# Patient Record
Sex: Female | Born: 1965 | Race: White | Hispanic: No | Marital: Single | Smoking: Never smoker
Health system: Southern US, Community
[De-identification: ages and names within clinical notes are randomized; demographics above are authoritative.]

## PROBLEM LIST (undated history)

## (undated) DIAGNOSIS — K2970 Gastritis, unspecified, without bleeding: Secondary | ICD-10-CM

## (undated) DIAGNOSIS — G4730 Sleep apnea, unspecified: Secondary | ICD-10-CM

## (undated) DIAGNOSIS — Z87898 Personal history of other specified conditions: Secondary | ICD-10-CM

## (undated) DIAGNOSIS — K589 Irritable bowel syndrome without diarrhea: Secondary | ICD-10-CM

## (undated) DIAGNOSIS — K219 Gastro-esophageal reflux disease without esophagitis: Secondary | ICD-10-CM

## (undated) DIAGNOSIS — E119 Type 2 diabetes mellitus without complications: Secondary | ICD-10-CM

## (undated) DIAGNOSIS — D649 Anemia, unspecified: Secondary | ICD-10-CM

## (undated) DIAGNOSIS — I10 Essential (primary) hypertension: Secondary | ICD-10-CM

## (undated) DIAGNOSIS — E7800 Pure hypercholesterolemia, unspecified: Secondary | ICD-10-CM

## (undated) HISTORY — DX: Gastritis, unspecified, without bleeding: K29.70

## (undated) HISTORY — DX: Irritable bowel syndrome, unspecified: K58.9

## (undated) HISTORY — DX: Anemia, unspecified: D64.9

## (undated) HISTORY — DX: Personal history of other specified conditions: Z87.898

## (undated) HISTORY — DX: Type 2 diabetes mellitus without complications: E11.9

## (undated) HISTORY — DX: Pure hypercholesterolemia, unspecified: E78.00

## (undated) HISTORY — DX: Gastro-esophageal reflux disease without esophagitis: K21.9

## (undated) HISTORY — DX: Essential (primary) hypertension: I10

## (undated) HISTORY — PX: HERNIA REPAIR: SHX51

## (undated) HISTORY — DX: Sleep apnea, unspecified: G47.30

## (undated) HISTORY — PX: EYE SURGERY: SHX253

## (undated) HISTORY — PX: APPENDECTOMY: SHX54

---
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
## 2005-05-24 ENCOUNTER — Ambulatory Visit: Payer: Self-pay | Admitting: Internal Medicine

## 2006-07-04 ENCOUNTER — Ambulatory Visit: Payer: Self-pay | Admitting: Internal Medicine

## 2006-10-19 ENCOUNTER — Ambulatory Visit: Payer: Self-pay | Admitting: Gastroenterology

## 2006-10-23 ENCOUNTER — Ambulatory Visit: Payer: Self-pay | Admitting: Gastroenterology

## 2006-10-23 LAB — HM COLONOSCOPY: HM Colonoscopy: NORMAL

## 2007-09-06 ENCOUNTER — Ambulatory Visit: Payer: Self-pay | Admitting: Internal Medicine

## 2007-09-12 ENCOUNTER — Ambulatory Visit: Payer: Self-pay | Admitting: Internal Medicine

## 2008-03-18 ENCOUNTER — Ambulatory Visit: Payer: Self-pay | Admitting: Internal Medicine

## 2008-07-14 ENCOUNTER — Ambulatory Visit: Payer: Self-pay | Admitting: Internal Medicine

## 2008-09-09 ENCOUNTER — Ambulatory Visit: Payer: Self-pay | Admitting: Internal Medicine

## 2009-03-07 ENCOUNTER — Inpatient Hospital Stay: Payer: Self-pay | Admitting: Surgery

## 2009-06-30 ENCOUNTER — Ambulatory Visit: Payer: Self-pay | Admitting: Gastroenterology

## 2010-06-16 ENCOUNTER — Ambulatory Visit: Payer: Self-pay | Admitting: Internal Medicine

## 2010-06-28 ENCOUNTER — Ambulatory Visit: Payer: Self-pay | Admitting: Internal Medicine

## 2010-07-29 ENCOUNTER — Ambulatory Visit: Payer: Self-pay | Admitting: Anesthesiology

## 2010-08-05 ENCOUNTER — Ambulatory Visit: Payer: Self-pay | Admitting: Surgery

## 2011-08-16 ENCOUNTER — Ambulatory Visit: Payer: Self-pay | Admitting: Internal Medicine

## 2012-05-14 ENCOUNTER — Other Ambulatory Visit: Payer: Self-pay | Admitting: *Deleted

## 2012-05-14 ENCOUNTER — Telehealth: Payer: Self-pay | Admitting: *Deleted

## 2012-05-14 MED ORDER — LOSARTAN POTASSIUM 25 MG PO TABS: 25.0000 mg | ORAL_TABLET | Freq: Every day | ORAL | Status: DC

## 2012-05-14 NOTE — Telephone Encounter (Signed)
Dr. Lorin Picket:  Is it ok to fill a new script for patient for 90 day supply of Losartan Potassium 25 mg Appointment 06/2012

## 2012-05-14 NOTE — Telephone Encounter (Signed)
Ok to fill losartan 25mg  q day #90 with no refills - to get her through to her appt

## 2012-05-14 NOTE — Telephone Encounter (Signed)
Filled script for losartan 25 mg

## 2012-05-17 ENCOUNTER — Other Ambulatory Visit: Payer: Self-pay | Admitting: *Deleted

## 2012-05-17 NOTE — Telephone Encounter (Signed)
Script filled.

## 2012-05-21 ENCOUNTER — Other Ambulatory Visit: Payer: Self-pay | Admitting: *Deleted

## 2012-05-21 ENCOUNTER — Ambulatory Visit (INDEPENDENT_AMBULATORY_CARE_PROVIDER_SITE_OTHER): Payer: BC Managed Care – PPO | Admitting: *Deleted

## 2012-05-21 DIAGNOSIS — Z309 Encounter for contraceptive management, unspecified: Secondary | ICD-10-CM

## 2012-05-21 DIAGNOSIS — IMO0001 Reserved for inherently not codable concepts without codable children: Secondary | ICD-10-CM

## 2012-05-22 ENCOUNTER — Other Ambulatory Visit: Payer: Self-pay | Admitting: *Deleted

## 2012-05-22 MED ORDER — MEDROXYPROGESTERONE ACETATE 150 MG/ML IM SUSP: 150.0000 mg | INTRAMUSCULAR | Status: DC

## 2012-05-22 MED ORDER — MEDROXYPROGESTERONE ACETATE 150 MG/ML IM SUSP
150.0000 mg | Freq: Once | INTRAMUSCULAR | Status: AC
  Administered 2012-05-22: 150 mg via INTRAMUSCULAR

## 2012-06-05 ENCOUNTER — Telehealth: Payer: Self-pay | Admitting: *Deleted

## 2012-06-05 NOTE — Telephone Encounter (Signed)
Pt is coming in for labs Tuesday (Jan. 2) what labs and dx would you like? Thank you

## 2012-06-06 ENCOUNTER — Other Ambulatory Visit: Payer: Self-pay | Admitting: Internal Medicine

## 2012-06-06 DIAGNOSIS — R739 Hyperglycemia, unspecified: Secondary | ICD-10-CM

## 2012-06-06 DIAGNOSIS — I10 Essential (primary) hypertension: Secondary | ICD-10-CM

## 2012-06-06 DIAGNOSIS — D649 Anemia, unspecified: Secondary | ICD-10-CM

## 2012-06-06 DIAGNOSIS — E7800 Pure hypercholesterolemia, unspecified: Secondary | ICD-10-CM

## 2012-06-06 NOTE — Progress Notes (Signed)
Orders placed for labs

## 2012-06-06 NOTE — Telephone Encounter (Signed)
Placed orders for labs.  Let me know if problems.

## 2012-06-07 ENCOUNTER — Other Ambulatory Visit (INDEPENDENT_AMBULATORY_CARE_PROVIDER_SITE_OTHER): Payer: BC Managed Care – PPO

## 2012-06-07 DIAGNOSIS — E7800 Pure hypercholesterolemia, unspecified: Secondary | ICD-10-CM

## 2012-06-07 DIAGNOSIS — R739 Hyperglycemia, unspecified: Secondary | ICD-10-CM

## 2012-06-07 DIAGNOSIS — D649 Anemia, unspecified: Secondary | ICD-10-CM

## 2012-06-07 DIAGNOSIS — R7309 Other abnormal glucose: Secondary | ICD-10-CM

## 2012-06-07 DIAGNOSIS — I10 Essential (primary) hypertension: Secondary | ICD-10-CM

## 2012-06-07 LAB — BASIC METABOLIC PANEL
BUN: 16 mg/dL (ref 6–23)
GFR: 65.4 mL/min (ref >60.00)
Potassium: 4.2 mEq/L (ref 3.5–5.1)
Sodium: 138 mEq/L (ref 135–145)

## 2012-06-07 LAB — LIPID PANEL
Cholesterol: 191 mg/dL (ref 0–200)
Total CHOL/HDL Ratio: 5
Triglycerides: 201 mg/dL — ABNORMAL HIGH (ref 0.0–149.0)
VLDL: 40.2 mg/dL — ABNORMAL HIGH (ref 0.0–40.0)

## 2012-06-07 LAB — HEPATIC FUNCTION PANEL
Albumin: 3.8 g/dL (ref 3.5–5.2)
Total Protein: 7 g/dL (ref 6.0–8.3)

## 2012-06-07 LAB — CBC WITH DIFFERENTIAL/PLATELET
Eosinophils Relative: 0.8 % (ref 0.0–5.0)
HCT: 41.6 % (ref 36.0–46.0)
Lymphs Abs: 1.5 10*3/uL (ref 0.7–4.0)
Monocytes Relative: 7.2 % (ref 3.0–12.0)
Neutrophils Relative %: 58.5 % (ref 43.0–77.0)
Platelets: 189 10*3/uL (ref 150.0–400.0)
WBC: 4.5 10*3/uL (ref 4.5–10.5)

## 2012-06-07 LAB — HEMOGLOBIN A1C: Hgb A1c MFr Bld: 6 % (ref 4.6–6.5)

## 2012-06-07 LAB — LDL CHOLESTEROL, DIRECT: Direct LDL: 131 mg/dL

## 2012-06-12 ENCOUNTER — Ambulatory Visit (INDEPENDENT_AMBULATORY_CARE_PROVIDER_SITE_OTHER): Payer: BC Managed Care – PPO | Admitting: Internal Medicine

## 2012-06-12 ENCOUNTER — Encounter: Payer: Self-pay | Admitting: Internal Medicine

## 2012-06-12 VITALS — BP 140/100 | HR 80 | Temp 98.6°F | Ht 63.0 in | Wt 176.5 lb

## 2012-06-12 DIAGNOSIS — E119 Type 2 diabetes mellitus without complications: Secondary | ICD-10-CM

## 2012-06-12 DIAGNOSIS — E7800 Pure hypercholesterolemia, unspecified: Secondary | ICD-10-CM

## 2012-06-12 DIAGNOSIS — I10 Essential (primary) hypertension: Secondary | ICD-10-CM

## 2012-06-12 DIAGNOSIS — D649 Anemia, unspecified: Secondary | ICD-10-CM

## 2012-06-12 LAB — HM DIABETES FOOT EXAM

## 2012-06-12 MED ORDER — MEDROXYPROGESTERONE ACETATE 150 MG/ML IM SUSP: 150.0000 mg | INTRAMUSCULAR | Status: DC

## 2012-06-25 ENCOUNTER — Encounter: Payer: Self-pay | Admitting: Internal Medicine

## 2012-06-25 NOTE — Assessment & Plan Note (Signed)
A1c just checked - 6.0.  Low carb diet.  Exercise.  Will follow.

## 2012-06-25 NOTE — Assessment & Plan Note (Signed)
Hgb just checked and wnl.  Ferritin wnl (low normal).  Can try taking the iron qod.  Follow hgb and ferritin.  Has had GI w/up including EGD and colonoscopy 5/08.

## 2012-06-25 NOTE — Assessment & Plan Note (Signed)
Blood pressure elevated.  Have her spot check her pressure.  Send in readings over the next few weeks.  Follow metabolic panel.  Cough resolved with stopping Lisinopril.  On Losartan.

## 2012-06-25 NOTE — Assessment & Plan Note (Signed)
Triglycerides improved.  (201).  Low cholesterol diet and exercise.  (LDL up some from previous - 131).  Follow.   Check lipid panel with next labs.

## 2012-06-25 NOTE — Progress Notes (Signed)
  Subjective:    Patient ID: Anne [name], female    DOB: 02-Jan-1966, 47 y.o.   MRN: [id number]  HPI 47 year old female with past history of IBS, gastritis, hypertension and diabetes who comes in today for a scheduled follow up.  She states she is doing relatively well.  Still under increased stress with her husband and the legal issues.  She feels she is handling things relatively well.  She has been exercising.  She is taking Dexilant now.  Off Nexium.  No significant problems swallowing.  Trying to watch what she eats.  Bowels stable.  On Prozac.    Past Medical History  Diagnosis Date  . Hypertension   . Diabetes mellitus   . Hypercholesterolemia   . Anemia   . History of abnormal Pap smear     s/p LEEP, h/o HPV  . IBS (irritable bowel syndrome)   . Gastritis     Current Outpatient Prescriptions on File Prior to Visit  Medication Sig Dispense Refill  . dexlansoprazole (DEXILANT) 60 MG capsule Take 60 mg by mouth daily.      Marland Kitchen FLUoxetine (PROZAC) 20 MG capsule Take 20 mg by mouth daily.      . fluticasone (FLONASE) 50 MCG/ACT nasal spray Place 2 sprays into the nose daily.      Marland Kitchen losartan (COZAAR) 25 MG tablet Take 1 tablet (25 mg total) by mouth daily.  90 tablet  3  . medroxyPROGESTERone (DEPO-PROVERA) 150 MG/ML injection Inject 1 mL (150 mg total) into the muscle every 3 (three) months.  1 mL  3    Review of Systems Patient denies any headache, lightheadedness or dizziness. No significant sinus or allergy problems.  No chest pain, tightness or palpitations.  No increased shortness of breath, cough or congestion.  No nausea or vomiting.  No significant acid reflux.   No abdominal pain or cramping.  No bowel change, such as diarrhea, constipation, BRBPR or melana.  No urine change.   Feels she is handling the stress relatively well.  Exercising.  Watching what she eats.      Objective:   Physical Exam Filed Vitals:   06/12/12 1538  BP: 140/100  Pulse: 80  Temp: 98.6 F (37 C)    Blood pressure recheck:  54/24  47 year old female in no acute distress.   HEENT:  Nares - clear.  OP- without lesions or erythema.  NECK:  Supple, nontender.  No audible bruit.   HEART:  Appears to be regular. LUNGS:  Without crackles or wheezing audible.  Respirations even and unlabored.   RADIAL PULSE:  Equal bilaterally.  ABDOMEN:  Soft, nontender.  No audible abdominal bruit.   EXTREMITIES:  No increased edema to be present.  Feet without lesions.                  Assessment & Plan:  INCREASED PSYCHOSOCIAL STRESSORS.  Handling things relatively well.  Discussed at length with her today.  Continue Prozac.  Follow.   GYN.  Had two positive HPV paps.  Had repeat ove rthe sumer.  Obtain results.  Saw Dr Patton Salles. Remains on Depo.    HEALTH MAINTENANCE.  Physical 12/29/11.  Pap as outlined above.  Mammogram 08/16/11 - BiRDADS II.  Colonoscopy 10/23/06.

## 2012-06-26 ENCOUNTER — Encounter: Payer: Self-pay | Admitting: Internal Medicine

## 2012-07-13 ENCOUNTER — Telehealth: Payer: Self-pay | Admitting: *Deleted

## 2012-07-13 NOTE — Telephone Encounter (Signed)
She wants to know if you can look at it or drain it for her. She said she can tolerate it for a few more days. Patient was informed that we will call her back Monday.

## 2012-07-13 NOTE — Telephone Encounter (Signed)
Patient called concerned about a bump that is under her arm. Bump is the size of a grape, it's hard red and painful. Patient thinks it may be a cyst. She wanted your opinion about want to put on the bump and if you think it would be all right for her to wait to see Ann Lions who she made an appointment with for march 25. However patient is wanting to see someone sooner about bump. Please advise?

## 2012-07-13 NOTE — Telephone Encounter (Signed)
Patient said that she would go to acute care. She will let us know how it went later.

## 2012-07-13 NOTE — Telephone Encounter (Signed)
If it is red and painful, I would recommend her going to acute care today and see if needs to be drained or needs abx and then we can follow up afterwards.

## 2012-07-13 NOTE — Telephone Encounter (Signed)
Needs to be looked at before next week.

## 2012-07-14 ENCOUNTER — Ambulatory Visit: Payer: Self-pay | Admitting: Emergency Medicine

## 2012-07-16 ENCOUNTER — Ambulatory Visit: Payer: Self-pay | Admitting: Emergency Medicine

## 2012-07-21 ENCOUNTER — Other Ambulatory Visit: Payer: Self-pay

## 2012-08-08 ENCOUNTER — Ambulatory Visit (INDEPENDENT_AMBULATORY_CARE_PROVIDER_SITE_OTHER): Payer: BC Managed Care – PPO | Admitting: *Deleted

## 2012-08-08 DIAGNOSIS — IMO0001 Reserved for inherently not codable concepts without codable children: Secondary | ICD-10-CM

## 2012-08-08 MED ORDER — METHYLPREDNISOLONE ACETATE 40 MG/ML IJ SUSP
150.0000 mg | Freq: Once | INTRAMUSCULAR | Status: AC
  Administered 2012-08-08: 150 mg via INTRAMUSCULAR

## 2012-08-26 ENCOUNTER — Telehealth: Payer: Self-pay | Admitting: Internal Medicine

## 2012-08-26 MED ORDER — FLUOXETINE HCL 10 MG PO CAPS: ORAL_CAPSULE | ORAL | Status: DC

## 2012-08-26 NOTE — Telephone Encounter (Signed)
Refilled prozac 10mg  (3 months with one refill)

## 2012-10-23 ENCOUNTER — Other Ambulatory Visit: Payer: Self-pay | Admitting: Internal Medicine

## 2012-10-23 NOTE — Telephone Encounter (Signed)
Please make sure that her Depo is called to the pharmacy today . She is coming in for her shot tomorrow.

## 2012-10-24 ENCOUNTER — Ambulatory Visit (INDEPENDENT_AMBULATORY_CARE_PROVIDER_SITE_OTHER): Payer: BC Managed Care – PPO | Admitting: *Deleted

## 2012-10-24 DIAGNOSIS — Z309 Encounter for contraceptive management, unspecified: Secondary | ICD-10-CM

## 2012-10-24 DIAGNOSIS — IMO0001 Reserved for inherently not codable concepts without codable children: Secondary | ICD-10-CM

## 2012-10-24 MED ORDER — MEDROXYPROGESTERONE ACETATE 150 MG/ML IM SUSP
150.0000 mg | Freq: Once | INTRAMUSCULAR | Status: AC
  Administered 2012-10-24: 150 mg via INTRAMUSCULAR

## 2012-10-24 MED ORDER — MEDROXYPROGESTERONE ACETATE 150 MG/ML IM SUSP: INTRAMUSCULAR | Status: DC

## 2012-11-23 ENCOUNTER — Telehealth: Payer: Self-pay | Admitting: *Deleted

## 2012-11-23 NOTE — Telephone Encounter (Signed)
Patient called and said she has enough Dexilant to last until mid week, but needs prior -authorization for further refills and wanted to know if fax had been received from pharmacy. Offered sample patient stated she would except next week if she has not received authorization.

## 2012-11-23 NOTE — Telephone Encounter (Signed)
Received PA form request ro the Dexilant Dr 60 mg Capsule , placed in Dr. Alverda Skeans

## 2012-11-27 NOTE — Telephone Encounter (Signed)
Form placed on your computer.  Thanks.

## 2012-12-03 ENCOUNTER — Telehealth: Payer: Self-pay | Admitting: *Deleted

## 2012-12-03 NOTE — Telephone Encounter (Signed)
Received a fax from [facility] Dexilant was APPROVED  06.03.2014-06.24.2015

## 2012-12-26 ENCOUNTER — Telehealth: Payer: Self-pay | Admitting: Internal Medicine

## 2012-12-26 NOTE — Telephone Encounter (Signed)
Pt states she contacted [facility] to schedule her mammogram and was told that they need previous notes regarding a cyst from February.  Pt says Dr. Lorin Picket has notes and info on the cyst and would like [place] to send the notes over to [facility].  She states [facility] needs the notes so they can review them to see if this needs to be diagnostic or not.  Pt states she was seen at [facility] by ?Dr. Excell Seltzer and was found to have a cyst in her armpit.  February 10th pt had diagnostic mammogram.    Pt was told the cyst was very close to her breast tissue but it was not breast tissue.  This was considered her yearly mammogram.

## 2012-12-26 NOTE — Telephone Encounter (Signed)
Pt had to reschedule her CPE to September.  Pt is scheduled for labs 7/25 and would like to keep this appt for labs.  Pt asking if it is okay to have labs done this soon before CPE.  It will be hard for pt to come two different days for labs and then appt. Pt only doing iron every other day instead of everyday.  Pt also asking if Tetanus needs to be done at her CPE.

## 2012-12-27 NOTE — Telephone Encounter (Signed)
I have my 12/29/11 note that includes the conclusions of the mammogram - with the last mammogram documented in the note - 3/13 Bir[facility] II.  Can send that note.  If they need more information, will need to request records and mammo reports from [facility].  Thanks.

## 2012-12-27 NOTE — Telephone Encounter (Signed)
I am ok if she gets her labs as scheduled in July and have her CPE in September.  Regarding the Tetanus - ok to get at her physical if she is due and if we have tetanus here.

## 2012-12-28 ENCOUNTER — Encounter: Payer: Self-pay | Admitting: *Deleted

## 2012-12-28 ENCOUNTER — Other Ambulatory Visit (INDEPENDENT_AMBULATORY_CARE_PROVIDER_SITE_OTHER): Payer: BC Managed Care – PPO

## 2012-12-28 DIAGNOSIS — D649 Anemia, unspecified: Secondary | ICD-10-CM

## 2012-12-28 DIAGNOSIS — E119 Type 2 diabetes mellitus without complications: Secondary | ICD-10-CM

## 2012-12-28 DIAGNOSIS — I10 Essential (primary) hypertension: Secondary | ICD-10-CM

## 2012-12-28 LAB — BASIC METABOLIC PANEL
CO2: 20 mEq/L (ref 19–32)
Calcium: 8.9 mg/dL (ref 8.4–10.5)
Chloride: 111 mEq/L (ref 96–112)
Potassium: 3.6 mEq/L (ref 3.5–5.1)
Sodium: 141 mEq/L (ref 135–145)

## 2012-12-28 LAB — CBC WITH DIFFERENTIAL/PLATELET
Basophils Absolute: 0 10*3/uL (ref 0.0–0.1)
Basophils Relative: 0.6 % (ref 0.0–3.0)
Eosinophils Absolute: 0.1 10*3/uL (ref 0.0–0.7)
Eosinophils Relative: 2.5 % (ref 0.0–5.0)
HCT: 40.8 % (ref 36.0–46.0)
Hemoglobin: 13.4 g/dL (ref 12.0–15.0)
Lymphocytes Relative: 35.7 % (ref 12.0–46.0)
Lymphs Abs: 1.9 10*3/uL (ref 0.7–4.0)
MCHC: 32.8 g/dL (ref 30.0–36.0)
MCV: 92.8 fl (ref 78.0–100.0)
Monocytes Absolute: 0.4 10*3/uL (ref 0.1–1.0)
Monocytes Relative: 7.9 % (ref 3.0–12.0)
Neutro Abs: 2.8 10*3/uL (ref 1.4–7.7)
Neutrophils Relative %: 53.3 % (ref 43.0–77.0)
Platelets: 206 10*3/uL (ref 150.0–400.0)
RBC: 4.39 Mil/uL (ref 3.87–5.11)
RDW: 13.5 % (ref 11.5–14.6)
WBC: 5.2 10*3/uL (ref 4.5–10.5)

## 2012-12-28 LAB — FERRITIN: Ferritin: 33.5 ng/mL (ref 10.0–291.0)

## 2012-12-28 LAB — LIPID PANEL
HDL: 41.9 mg/dL (ref >39.00)
VLDL: 28.8 mg/dL (ref 0.0–40.0)

## 2012-12-28 LAB — HEMOGLOBIN A1C: Hgb A1c MFr Bld: 6.3 % (ref 4.6–6.5)

## 2012-12-28 NOTE — Telephone Encounter (Signed)
Faxed copy of 12/29/11 note to [facility] & informed then of mammo from [facility]

## 2012-12-28 NOTE — Telephone Encounter (Signed)
Sent mychart message

## 2012-12-31 ENCOUNTER — Encounter: Payer: BC Managed Care – PPO | Admitting: Internal Medicine

## 2013-01-02 ENCOUNTER — Encounter: Payer: Self-pay | Admitting: Internal Medicine

## 2013-01-09 ENCOUNTER — Encounter: Payer: Self-pay | Admitting: *Deleted

## 2013-01-16 ENCOUNTER — Ambulatory Visit (INDEPENDENT_AMBULATORY_CARE_PROVIDER_SITE_OTHER): Payer: BC Managed Care – PPO | Admitting: *Deleted

## 2013-01-16 DIAGNOSIS — Z309 Encounter for contraceptive management, unspecified: Secondary | ICD-10-CM

## 2013-01-16 MED ORDER — MEDROXYPROGESTERONE ACETATE 150 MG/ML IM SUSP
150.0000 mg | Freq: Once | INTRAMUSCULAR | Status: AC
  Administered 2013-01-16: 150 mg via INTRAMUSCULAR

## 2013-01-24 ENCOUNTER — Ambulatory Visit (INDEPENDENT_AMBULATORY_CARE_PROVIDER_SITE_OTHER): Payer: BC Managed Care – PPO | Admitting: Adult Health

## 2013-01-24 ENCOUNTER — Encounter: Payer: Self-pay | Admitting: Adult Health

## 2013-01-24 VITALS — BP 118/76 | HR 98 | Temp 98.3°F | Resp 14 | Wt 181.5 lb

## 2013-01-24 DIAGNOSIS — K219 Gastro-esophageal reflux disease without esophagitis: Secondary | ICD-10-CM | POA: Insufficient documentation

## 2013-01-24 DIAGNOSIS — J329 Chronic sinusitis, unspecified: Secondary | ICD-10-CM

## 2013-01-24 MED ORDER — AMOXICILLIN-POT CLAVULANATE 875-125 MG PO TABS: 1.0000 | ORAL_TABLET | Freq: Two times a day (BID) | ORAL | Status: DC

## 2013-01-24 MED ORDER — FLUCONAZOLE 150 MG PO TABS: 150.0000 mg | ORAL_TABLET | Freq: Once | ORAL | Status: DC

## 2013-01-24 MED ORDER — FLUTICASONE PROPIONATE 50 MCG/ACT NA SUSP: 2.0000 | Freq: Every day | NASAL | Status: DC

## 2013-01-24 NOTE — Assessment & Plan Note (Signed)
Worsening symptoms now with green drainage. Start augmentin. Diflucan in the event of yeast infection. Continue supportive medication taken otc for symptoms. RTC if no improvement within 3-4 days.

## 2013-01-24 NOTE — Patient Instructions (Addendum)
  Start Augmentin twice a day for 7 days.  Flonase 2 sprays into each nostril daily.  Continue the medication you have been taking for symptoms.  Saline spray will also help flush sinuses.  Humidifier will also help.  Diflucan in the event you develop a yeast infection from antibiotic.  Let us know if symptoms not improved within 3-4 days.

## 2013-01-24 NOTE — Progress Notes (Signed)
  Subjective:    Patient ID: Anne [name], female    DOB: 10/10/1965, 47 y.o.   MRN: [id number]  HPI  Patient presents to clinic with c/o cold symptoms starting on Thursday. She was out of work on Friday. She does not think she had a fever. Symptoms include HA, coughing. Feels that it has gone down into her chest. Feels zero energy. Drainage has been yellow, green drainage. She has taken mucinex and chest congestion relief D.   Current Outpatient Prescriptions on File Prior to Visit  Medication Sig Dispense Refill  . dexlansoprazole (DEXILANT) 60 MG capsule Take 60 mg by mouth daily.      . fish oil-omega-3 fatty acids 1000 MG capsule Take 1 g by mouth daily.      Marland Kitchen FLUoxetine (PROZAC) 10 MG capsule Take two tablets daily  180 capsule  1  . fluticasone (FLONASE) 50 MCG/ACT nasal spray Place 2 sprays into the nose daily.      Marland Kitchen glucose blood test strip One touch ultra test strip Check blood sugar q day      . losartan (COZAAR) 25 MG tablet Take 1 tablet (25 mg total) by mouth daily.  90 tablet  3  . medroxyPROGESTERone (DEPO-PROVERA) 150 MG/ML injection INJECT 1 ML INTRAMUSCULARLY EVERY THREE MONTHS  1 mL  0  . Multiple Vitamin (MULTIVITAMIN) tablet Take 1 tablet by mouth daily.       No current facility-administered medications on file prior to visit.     Review of Systems  Constitutional: Positive for chills and fatigue. Negative for appetite change.  HENT: Positive for congestion, sore throat, rhinorrhea, postnasal drip and sinus pressure.   Respiratory: Positive for cough.   Gastrointestinal: Negative.   Genitourinary: Negative.   Neurological: Positive for headaches.  Psychiatric/Behavioral: Negative.      BP 118/76  Pulse 98  Temp(Src) 98.3 F (36.8 C) (Oral)  Resp 14  Wt 181 lb 8 oz (82.328 kg)  BMI 32.16 kg/m2  SpO2 97%    Objective:   Physical Exam  Constitutional: She is oriented to person, place, and time. She appears well-developed and well-nourished. No  distress.  HENT:  Head: Normocephalic and atraumatic.  Right Ear: External ear normal.  Left Ear: External ear normal.  Pharyngeal erythema without exudate  Cardiovascular: Normal rate, regular rhythm, normal heart sounds and intact distal pulses.  Exam reveals no gallop.   No murmur heard. Pulmonary/Chest: Effort normal and breath sounds normal. No respiratory distress. She has no wheezes. She has no rales.  Abdominal: Soft. Bowel sounds are normal.  Musculoskeletal: Normal range of motion. She exhibits no edema and no tenderness.  Lymphadenopathy:    She has no cervical adenopathy.  Neurological: She is alert and oriented to person, place, and time.  Skin: Skin is warm and dry.  Psychiatric: She has a normal mood and affect. Her behavior is normal. Judgment normal.      Assessment & Plan:

## 2013-01-25 ENCOUNTER — Telehealth: Payer: Self-pay | Admitting: Internal Medicine

## 2013-01-25 ENCOUNTER — Other Ambulatory Visit: Payer: Self-pay | Admitting: Adult Health

## 2013-01-25 MED ORDER — GUAIFENESIN-CODEINE 100-10 MG/5ML PO SYRP: 5.0000 mL | ORAL_SOLUTION | Freq: Three times a day (TID) | ORAL | Status: DC | PRN

## 2013-01-25 NOTE — Telephone Encounter (Signed)
Patient Information:  Caller Name: G[place]  Phone: [phone]  Patient: Anne [name]  Gender: Female  DOB: Sep 21, 1965  Age: 47 Years  PCP: Dale Maceo  Pregnant: No  Office Follow Up:  Does the office need to follow up with this patient?: Yes  Instructions For The Office: Requesting Rx Cough Med be called into [facility]   Symptoms  Reason For Call & Symptoms: Seen in the office on 01/24/13 and started on Augmentin for URI. She has frequent mostly dry cough. Occasionally productive on 01/24/13 for either clear of thick greenish mucos. Using Flonase and other meds as directed. She was up most of the night coughing. Requesting Cough Med for the weekend so she can get some rest before she has to go back to work on Monday. [facility] [place] [phone]  Reviewed Health History In EMR: Yes  Reviewed Medications In EMR: Yes  Reviewed Allergies In EMR: Yes  Reviewed Surgeries / Procedures: Yes  Date of Onset of Symptoms: 01/17/2013  Treatments Tried: Flonsase, Musinex,  Chest Congestion Relief D  Treatments Tried Worked: No OB / GYN:  LMP: Unknown  Guideline(s) Used:  No Protocol Available - Sick Adult  Disposition Per Guideline:   Discuss with PCP and Callback by Nurse Today  Reason For Disposition Reached:   Nursing judgment  Advice Given:  Call Back If:  New symptoms develop  You become worse.  Patient Will Follow Care Advice:  YES

## 2013-01-25 NOTE — Telephone Encounter (Signed)
Pt called back and was asking about getting a cough medicine/ She is feeling very bed and was going to go to sleep for a while and was wanting a call to her cell phone when we sent it in to the pharmacy.

## 2013-01-25 NOTE — Telephone Encounter (Signed)
Fwd to Raquel 

## 2013-01-25 NOTE — Telephone Encounter (Signed)
Pt called back wanting a call before end of day If not going to call in cough med what would be the best over counter cough meds

## 2013-01-25 NOTE — Telephone Encounter (Signed)
Pt notified that Rx for Robitussin [facility] faxed to pharmacy.

## 2013-02-25 ENCOUNTER — Encounter: Payer: Self-pay | Admitting: Internal Medicine

## 2013-02-25 ENCOUNTER — Ambulatory Visit (INDEPENDENT_AMBULATORY_CARE_PROVIDER_SITE_OTHER): Payer: BC Managed Care – PPO | Admitting: Internal Medicine

## 2013-02-25 ENCOUNTER — Other Ambulatory Visit (HOSPITAL_COMMUNITY)
Admission: RE | Admit: 2013-02-25 | Discharge: 2013-02-25 | Disposition: A | Discharge status: Home / Self Care | Payer: BC Managed Care – PPO | Source: Ambulatory Visit | Attending: Internal Medicine | Admitting: Internal Medicine

## 2013-02-25 VITALS — BP 130/90 | HR 92 | Temp 98.9°F | Ht 63.5 in | Wt 187.5 lb

## 2013-02-25 DIAGNOSIS — Z124 Encounter for screening for malignant neoplasm of cervix: Secondary | ICD-10-CM

## 2013-02-25 DIAGNOSIS — Z01419 Encounter for gynecological examination (general) (routine) without abnormal findings: Secondary | ICD-10-CM | POA: Insufficient documentation

## 2013-02-25 DIAGNOSIS — E119 Type 2 diabetes mellitus without complications: Secondary | ICD-10-CM

## 2013-02-25 DIAGNOSIS — Z23 Encounter for immunization: Secondary | ICD-10-CM

## 2013-02-25 DIAGNOSIS — Z309 Encounter for contraceptive management, unspecified: Secondary | ICD-10-CM

## 2013-02-25 DIAGNOSIS — Z1151 Encounter for screening for human papillomavirus (HPV): Secondary | ICD-10-CM | POA: Insufficient documentation

## 2013-02-25 DIAGNOSIS — D649 Anemia, unspecified: Secondary | ICD-10-CM

## 2013-02-25 DIAGNOSIS — IMO0001 Reserved for inherently not codable concepts without codable children: Secondary | ICD-10-CM

## 2013-02-25 DIAGNOSIS — R87810 Cervical high risk human papillomavirus (HPV) DNA test positive: Secondary | ICD-10-CM | POA: Insufficient documentation

## 2013-02-25 DIAGNOSIS — E7800 Pure hypercholesterolemia, unspecified: Secondary | ICD-10-CM

## 2013-02-25 DIAGNOSIS — I10 Essential (primary) hypertension: Secondary | ICD-10-CM

## 2013-02-25 MED ORDER — MEDROXYPROGESTERONE ACETATE 150 MG/ML IM SUSP: INTRAMUSCULAR | Status: DC

## 2013-02-25 MED ORDER — TRIAMCINOLONE ACETONIDE 0.1 % EX CREA: TOPICAL_CREAM | Freq: Two times a day (BID) | CUTANEOUS | Status: DC

## 2013-02-25 NOTE — Progress Notes (Signed)
Subjective:    Patient ID: Anne [name], female    DOB: 1965/09/26, 47 y.o.   MRN: [id number]  Poison Lajoyce Corners  47 year old female with past history of IBS, gastritis, hypertension and diabetes who comes in today to follow up on these issues as well as for a complete physical exam.  She states she is doing relatively well.  Still under increased stress with her husband and the legal issues.  She feels she is handling things relatively well.  She has not been exercising regularly.   She is taking Dexilant now.  Off Nexium.  No significant problems swallowing.  Bowels stable.  On Prozac.  Doing well with the prozac.  Has minimal rash left and right arm.  Appears to be consistent with a contact dermatitis(poison ivy).  Some itching.  Still receiving Depo injections.  Discussed the importance of diet and exercise.  Discussed dietary modification.     Past Medical History  Diagnosis Date  . Hypertension   . Diabetes mellitus   . Hypercholesterolemia   . Anemia   . History of abnormal Pap smear     s/p LEEP, h/o HPV  . IBS (irritable bowel syndrome)   . Gastritis     Current Outpatient Prescriptions on File Prior to Visit  Medication Sig Dispense Refill  . dexlansoprazole (DEXILANT) 60 MG capsule Take 60 mg by mouth daily.      . fish oil-omega-3 fatty acids 1000 MG capsule Take 1 g by mouth daily.      Marland Kitchen FLUoxetine (PROZAC) 10 MG capsule Take two tablets daily  180 capsule  1  . fluticasone (FLONASE) 50 MCG/ACT nasal spray Place 2 sprays into the nose daily.  16 g  4  . glucose blood test strip One touch ultra test strip Check blood sugar q day      . losartan (COZAAR) 25 MG tablet Take 1 tablet (25 mg total) by mouth daily.  90 tablet  3  . medroxyPROGESTERone (DEPO-PROVERA) 150 MG/ML injection INJECT 1 ML INTRAMUSCULARLY EVERY THREE MONTHS  1 mL  0  . Multiple Vitamin (MULTIVITAMIN) tablet Take 1 tablet by mouth daily.       No current facility-administered medications on file prior to  visit.    Review of Systems Patient denies any headache, lightheadedness or dizziness. No significant sinus or allergy problems.  No chest pain, tightness or palpitations.  No increased shortness of breath, cough or congestion.  No nausea or vomiting.  No significant acid reflux.   No abdominal pain or cramping.  No bowel change, such as diarrhea, constipation, BRBPR or melana.  No urine change.   Feels she is handling the stress relatively well.  Not exercising regularly.  Not watching what she eats.  Plans to get back in her routine of exercising.       Objective:   Physical Exam  Filed Vitals:   02/25/13 1447  BP: 130/90  Pulse: 92  Temp: 98.9 F (37.2 C)   Blood pressure recheck:  34/6  47 year old female in no acute distress.   HEENT:  Nares- clear.  Oropharynx - without lesions. NECK:  Supple.  Nontender.  No audible bruit.  HEART:  Appears to be regular. LUNGS:  No crackles or wheezing audible.  Respirations even and unlabored.  RADIAL PULSE:  Equal bilaterally.    BREASTS:  No nipple discharge or nipple retraction present.  Could not appreciate any distinct nodules or axillary adenopathy.  ABDOMEN:  Soft, nontender.  Bowel sounds present and normal.  No audible abdominal bruit.  GU:  Normal external genitalia.  Vaginal vault without lesions.  Cervix identified.  Pap performed. Could not appreciate any adnexal masses or tenderness.   RECTAL:  Heme negative.   EXTREMITIES:  No increased edema present.  DP pulses palpable and equal bilaterally.      SKIN:  Erythematous based rash - right and left forearm.  Appears to be c/w contact dermatitis.        Assessment & Plan:  INCREASED PSYCHOSOCIAL STRESSORS.  Handling things relatively well.   Continue Prozac.  Follow.   GYN.  Had two positive HPV paps.  Had repeat over rthe sumer.  Saw Dr Patton Salles. Remains on Depo.  Pelvic/pap today.   HEALTH MAINTENANCE.  Physical today.  Pap as outlined above.  Mammogram 08/16/11 - BiRDADS  II.  Information given to schedule mammogram.  Colonoscopy 10/23/06.

## 2013-02-28 ENCOUNTER — Encounter: Payer: Self-pay | Admitting: Internal Medicine

## 2013-02-28 ENCOUNTER — Telehealth: Payer: Self-pay | Admitting: Internal Medicine

## 2013-02-28 NOTE — Telephone Encounter (Signed)
Anne [name] is going to call you tomorrow (03/01/13) and schedule a time she can come in on 08/15/12.  She needs a recheck and f/u pap (please make note on schedule - needs pap).  She is going to try to coordinate it with other appts.

## 2013-02-28 NOTE — Assessment & Plan Note (Signed)
Triglycerides improved.  (144).  Low cholesterol diet and exercise.  (LDL up some from previous - 138).  Follow.   Check lipid panel with next labs.

## 2013-02-28 NOTE — Assessment & Plan Note (Signed)
Low carb diet and exercise.  Check met b and a1c.  Keep up to date with eye exams.     

## 2013-02-28 NOTE — Assessment & Plan Note (Signed)
Blood pressure as outlined.  Check metabolic panel.  Follow pressures.   

## 2013-02-28 NOTE — Assessment & Plan Note (Signed)
Hgb just checked and wnl.  Ferritin wnl.   Can decrease iron to 2x/week.  Follow hgb and ferritin.  Has had GI w/up including EGD and colonoscopy 5/08.

## 2013-03-01 NOTE — Telephone Encounter (Signed)
Pt notified to check back closer to her appt. We usually keep at least two here at all times

## 2013-03-01 NOTE — Telephone Encounter (Signed)
Pt called to r/s appointments Dr Lorin Picket appointment.labs  08/15/13 Pt has appointment for depo shot 11/4  Pt wanted to make sure that the depo shot was here for her appointment

## 2013-04-09 ENCOUNTER — Ambulatory Visit (INDEPENDENT_AMBULATORY_CARE_PROVIDER_SITE_OTHER): Payer: BC Managed Care – PPO | Admitting: *Deleted

## 2013-04-09 ENCOUNTER — Ambulatory Visit: Payer: BC Managed Care – PPO

## 2013-04-09 DIAGNOSIS — Z309 Encounter for contraceptive management, unspecified: Secondary | ICD-10-CM

## 2013-04-09 MED ORDER — MEDROXYPROGESTERONE ACETATE 150 MG/ML IM SUSP
150.0000 mg | Freq: Once | INTRAMUSCULAR | Status: AC
  Administered 2013-04-09: 150 mg via INTRAMUSCULAR

## 2013-04-11 ENCOUNTER — Other Ambulatory Visit: Payer: Self-pay

## 2013-04-16 ENCOUNTER — Ambulatory Visit: Payer: BC Managed Care – PPO

## 2013-04-16 ENCOUNTER — Other Ambulatory Visit: Payer: BC Managed Care – PPO

## 2013-06-19 ENCOUNTER — Telehealth: Payer: Self-pay | Admitting: Internal Medicine

## 2013-06-19 DIAGNOSIS — R928 Other abnormal and inconclusive findings on diagnostic imaging of breast: Secondary | ICD-10-CM

## 2013-06-19 NOTE — Telephone Encounter (Signed)
Had talked with pt and Dr Marlowe KaysEly's office.  No further w/up or scanning recommended from last year's mammo after drainage of cyst.  Recommend f/u one year mammo.  Order placed for f/u diagnostic mammo.

## 2013-06-20 ENCOUNTER — Other Ambulatory Visit: Payer: Self-pay | Admitting: Internal Medicine

## 2013-06-20 ENCOUNTER — Encounter: Payer: Self-pay | Admitting: Emergency Medicine

## 2013-06-25 ENCOUNTER — Encounter: Payer: Self-pay | Admitting: Emergency Medicine

## 2013-06-27 ENCOUNTER — Ambulatory Visit: Payer: BC Managed Care – PPO | Admitting: Internal Medicine

## 2013-07-02 ENCOUNTER — Ambulatory Visit (INDEPENDENT_AMBULATORY_CARE_PROVIDER_SITE_OTHER): Payer: BC Managed Care – PPO | Admitting: *Deleted

## 2013-07-02 DIAGNOSIS — Z309 Encounter for contraceptive management, unspecified: Secondary | ICD-10-CM

## 2013-07-02 MED ORDER — MEDROXYPROGESTERONE ACETATE 150 MG/ML IM SUSP
150.0000 mg | Freq: Once | INTRAMUSCULAR | Status: AC
  Administered 2013-07-02: 150 mg via INTRAMUSCULAR

## 2013-07-18 ENCOUNTER — Ambulatory Visit: Payer: Self-pay | Admitting: Internal Medicine

## 2013-07-18 LAB — HM MAMMOGRAPHY: HM MAMMO: NEGATIVE

## 2013-07-19 ENCOUNTER — Encounter: Payer: Self-pay | Admitting: Internal Medicine

## 2013-08-15 ENCOUNTER — Encounter: Payer: Self-pay | Admitting: Internal Medicine

## 2013-08-15 ENCOUNTER — Ambulatory Visit (INDEPENDENT_AMBULATORY_CARE_PROVIDER_SITE_OTHER): Payer: BC Managed Care – PPO | Admitting: Internal Medicine

## 2013-08-15 ENCOUNTER — Other Ambulatory Visit (HOSPITAL_COMMUNITY)
Admission: RE | Admit: 2013-08-15 | Discharge: 2013-08-15 | Disposition: A | Discharge status: Home / Self Care | Payer: BC Managed Care – PPO | Source: Ambulatory Visit | Attending: Internal Medicine | Admitting: Internal Medicine

## 2013-08-15 VITALS — BP 118/90 | HR 81 | Temp 98.4°F | Ht 63.5 in | Wt 198.5 lb

## 2013-08-15 DIAGNOSIS — M79609 Pain in unspecified limb: Secondary | ICD-10-CM

## 2013-08-15 DIAGNOSIS — E119 Type 2 diabetes mellitus without complications: Secondary | ICD-10-CM

## 2013-08-15 DIAGNOSIS — D649 Anemia, unspecified: Secondary | ICD-10-CM

## 2013-08-15 DIAGNOSIS — Z1151 Encounter for screening for human papillomavirus (HPV): Secondary | ICD-10-CM | POA: Insufficient documentation

## 2013-08-15 DIAGNOSIS — E7800 Pure hypercholesterolemia, unspecified: Secondary | ICD-10-CM

## 2013-08-15 DIAGNOSIS — K219 Gastro-esophageal reflux disease without esophagitis: Secondary | ICD-10-CM

## 2013-08-15 DIAGNOSIS — M79673 Pain in unspecified foot: Secondary | ICD-10-CM

## 2013-08-15 DIAGNOSIS — R87810 Cervical high risk human papillomavirus (HPV) DNA test positive: Secondary | ICD-10-CM | POA: Insufficient documentation

## 2013-08-15 DIAGNOSIS — Z124 Encounter for screening for malignant neoplasm of cervix: Secondary | ICD-10-CM

## 2013-08-15 DIAGNOSIS — I10 Essential (primary) hypertension: Secondary | ICD-10-CM

## 2013-08-15 LAB — CBC WITH DIFFERENTIAL/PLATELET
BASOS ABS: 0 10*3/uL (ref 0.0–0.1)
Basophils Relative: 0.6 % (ref 0.0–3.0)
EOS PCT: 1 % (ref 0.0–5.0)
Eosinophils Absolute: 0.1 10*3/uL (ref 0.0–0.7)
HCT: 40.5 % (ref 36.0–46.0)
Hemoglobin: 13.9 g/dL (ref 12.0–15.0)
LYMPHS PCT: 27.4 % (ref 12.0–46.0)
Lymphs Abs: 1.5 10*3/uL (ref 0.7–4.0)
MCHC: 34.2 g/dL (ref 30.0–36.0)
MCV: 89.9 fl (ref 78.0–100.0)
MONOS PCT: 8.3 % (ref 3.0–12.0)
Monocytes Absolute: 0.4 10*3/uL (ref 0.1–1.0)
NEUTROS PCT: 62.7 % (ref 43.0–77.0)
Neutro Abs: 3.4 10*3/uL (ref 1.4–7.7)
PLATELETS: 219 10*3/uL (ref 150.0–400.0)
RBC: 4.51 Mil/uL (ref 3.87–5.11)
RDW: 13.3 % (ref 11.5–14.6)
WBC: 5.4 10*3/uL (ref 4.5–10.5)

## 2013-08-15 LAB — COMPREHENSIVE METABOLIC PANEL
ALBUMIN: 3.9 g/dL (ref 3.5–5.2)
ALT: 29 U/L (ref 0–35)
AST: 22 U/L (ref 0–37)
Alkaline Phosphatase: 77 U/L (ref 39–117)
BUN: 12 mg/dL (ref 6–23)
CHLORIDE: 109 meq/L (ref 96–112)
CO2: 25 meq/L (ref 19–32)
Calcium: 9.3 mg/dL (ref 8.4–10.5)
Creatinine, Ser: 0.9 mg/dL (ref 0.4–1.2)
GFR: 70.85 mL/min (ref >60.00)
GLUCOSE: 109 mg/dL — AB (ref 70–99)
POTASSIUM: 4.2 meq/L (ref 3.5–5.1)
SODIUM: 140 meq/L (ref 135–145)
TOTAL PROTEIN: 6.9 g/dL (ref 6.0–8.3)
Total Bilirubin: 0.7 mg/dL (ref 0.3–1.2)

## 2013-08-15 LAB — LIPID PANEL
Cholesterol: 218 mg/dL — ABNORMAL HIGH (ref 0–200)
HDL: 36.4 mg/dL — AB (ref >39.00)
LDL Cholesterol: 127 mg/dL — ABNORMAL HIGH (ref 0–99)
Total CHOL/HDL Ratio: 6
Triglycerides: 273 mg/dL — ABNORMAL HIGH (ref 0.0–149.0)
VLDL: 54.6 mg/dL — ABNORMAL HIGH (ref 0.0–40.0)

## 2013-08-15 LAB — MICROALBUMIN / CREATININE URINE RATIO
Creatinine,U: 271.4 mg/dL
MICROALB UR: 1.3 mg/dL (ref 0.0–1.9)
MICROALB/CREAT RATIO: 0.5 mg/g (ref 0.0–30.0)

## 2013-08-15 LAB — FERRITIN: FERRITIN: 26.3 ng/mL (ref 10.0–291.0)

## 2013-08-15 LAB — TSH: TSH: 0.85 u[IU]/mL (ref 0.35–5.50)

## 2013-08-15 LAB — HEMOGLOBIN A1C: HEMOGLOBIN A1C: 6.6 % — AB (ref 4.6–6.5)

## 2013-08-15 NOTE — Progress Notes (Signed)
Pre-visit discussion using our clinic review tool. No additional management support is needed unless otherwise documented below in the visit note.  

## 2013-08-15 NOTE — Patient Instructions (Signed)
Add ranitidine 150mg  - take 30 minutes before your evening meal.

## 2013-08-15 NOTE — Assessment & Plan Note (Addendum)
Low carb diet and exercise.  Check met b and a1c.  Keep up to date with eye exams.

## 2013-08-17 ENCOUNTER — Encounter: Payer: Self-pay | Admitting: Internal Medicine

## 2013-08-18 ENCOUNTER — Encounter: Payer: Self-pay | Admitting: Internal Medicine

## 2013-08-18 DIAGNOSIS — M79673 Pain in unspecified foot: Secondary | ICD-10-CM | POA: Insufficient documentation

## 2013-08-18 NOTE — Progress Notes (Signed)
Subjective:    Patient ID: Anne [name], female    DOB: 06-01-1966, 48 y.o.   MRN: [id number]  HPI 48 year old female with past history of IBS, gastritis, hypertension and diabetes who comes in today for a scheduled follow up.  Here of a repeat pap smear.  She states she is doing relatively well.  Had been under increased stress with her husband and the legal issues.  This is better now.  She feels she is handling things relatively well.  She has not been exercising.  She is taking Dexilant now.  Off Nexium.  More acid reflux noted on dexilant.  No significant problems swallowing.  Trying to watch what she eats.  Plans to do better.  Bowels stable.  On Prozac.  Was having issues with her feet - bottom of her heels.  Got some [employer] shoes.  Better.  We discussed stretches.     Past Medical History  Diagnosis Date  . Hypertension   . Diabetes mellitus   . Hypercholesterolemia   . Anemia   . History of abnormal Pap smear     s/p LEEP, h/o HPV  . IBS (irritable bowel syndrome)   . Gastritis     Current Outpatient Prescriptions on File Prior to Visit  Medication Sig Dispense Refill  . dexlansoprazole (DEXILANT) 60 MG capsule Take 60 mg by mouth daily.      . fish oil-omega-3 fatty acids 1000 MG capsule Take 1 g by mouth daily.      Marland Kitchen FLUoxetine (PROZAC) 10 MG capsule TAKE 2 CAPSULES BY MOUTH EVERY DAY  180 capsule  0  . fluticasone (FLONASE) 50 MCG/ACT nasal spray Place 2 sprays into the nose daily.  16 g  4  . glucose blood test strip One touch ultra test strip Check blood sugar q day      . losartan (COZAAR) 25 MG tablet TAKE 1 TABLET (25 MG TOTAL) BY MOUTH DAILY.  90 tablet  1  . medroxyPROGESTERone (DEPO-PROVERA) 150 MG/ML injection INJECT 1 ML INTRAMUSCULARLY EVERY THREE MONTHS  1 mL  3  . Multiple Vitamin (MULTIVITAMIN) tablet Take 1 tablet by mouth daily.       No current facility-administered medications on file prior to visit.    Review of Systems Patient denies any headache,  lightheadedness or dizziness. No significant sinus or allergy problems.  No chest pain, tightness or palpitations.  No increased shortness of breath, cough or congestion.  No nausea or vomiting.  Some acid reflux.   On Dexilant.  No abdominal pain or cramping.  No bowel change, such as diarrhea, constipation, BRBPR or melana.  No urine change.   Feels she is handling the stress relatively well.  Not exercising.  Plans to restart.  Plans to start watching what she eats.      Objective:   Physical Exam  Filed Vitals:   08/15/13 0937  BP: 118/90  Pulse: 81  Temp: 98.4 F (21.64 C)   48 year old female in no acute distress.   HEENT:  Nares- clear.  Oropharynx - without lesions. NECK:  Supple.  Nontender.  No audible bruit.  HEART:  Appears to be regular. LUNGS:  No crackles or wheezing audible.  Respirations even and unlabored.  RADIAL PULSE:  Equal bilaterally.   ABDOMEN:  Soft, nontender.  Bowel sounds present and normal.  No audible abdominal bruit.  GU:  Normal external genitalia.  Vaginal vault without lesions.  Cervix identified.  Pap performed. Could not  appreciate any adnexal masses or tenderness.   EXTREMITIES:  No increased edema present.  DP pulses palpable and equal bilaterally.   FEET:  No lesions.           Assessment & Plan:  INCREASED PSYCHOSOCIAL STRESSORS.  Handling things relatively well.  Stress better.   Continue Prozac.  Follow.   GYN.  Had two positive HPV paps.  Saw Dr Patton SallesWeaver-Lee. Remains on Depo.  Repeat pap today.    HEALTH MAINTENANCE.  Pap as outlined above.  Mammogram 07/18/13 - BiRDADS I.  Colonoscopy 10/23/06.

## 2013-08-18 NOTE — Assessment & Plan Note (Signed)
Hgb last checked and wnl.  Ferritin wnl.   Follow hgb and ferritin.  Has had GI w/up including EGD and colonoscopy 5/08.

## 2013-08-18 NOTE — Assessment & Plan Note (Signed)
Reflux on dexilant.  Has had EGD previously.  Will add zantac in the evening.  Follow.

## 2013-08-18 NOTE — Assessment & Plan Note (Signed)
Blood pressure as outlined.  Check metabolic panel.  Follow pressures.

## 2013-08-18 NOTE — Assessment & Plan Note (Signed)
Possible plantar fasciitis.  Stretches.  Support shoes.  Follow.

## 2013-08-18 NOTE — Assessment & Plan Note (Signed)
Low cholesterol diet and exercise.  Follow.  Check lipid panel with next labs.    

## 2013-08-26 LAB — HM DIABETES EYE EXAM

## 2013-08-27 ENCOUNTER — Encounter: Payer: Self-pay | Admitting: Internal Medicine

## 2013-08-27 NOTE — Telephone Encounter (Signed)
Pt can proceed in scheduling apt. I will fax over the lab.

## 2013-09-19 ENCOUNTER — Encounter: Payer: Self-pay | Admitting: Internal Medicine

## 2013-09-19 DIAGNOSIS — R87810 Cervical high risk human papillomavirus (HPV) DNA test positive: Secondary | ICD-10-CM | POA: Insufficient documentation

## 2013-09-19 DIAGNOSIS — IMO0002 Reserved for concepts with insufficient information to code with codable children: Secondary | ICD-10-CM

## 2013-09-19 DIAGNOSIS — Z8619 Personal history of other infectious and parasitic diseases: Secondary | ICD-10-CM | POA: Insufficient documentation

## 2013-09-23 ENCOUNTER — Encounter: Payer: Self-pay | Admitting: Internal Medicine

## 2013-09-24 ENCOUNTER — Ambulatory Visit (INDEPENDENT_AMBULATORY_CARE_PROVIDER_SITE_OTHER): Payer: BC Managed Care – PPO | Admitting: *Deleted

## 2013-09-24 DIAGNOSIS — Z309 Encounter for contraceptive management, unspecified: Secondary | ICD-10-CM

## 2013-09-24 MED ORDER — MEDROXYPROGESTERONE ACETATE 150 MG/ML IM SUSP
150.0000 mg | Freq: Once | INTRAMUSCULAR | Status: AC
  Administered 2013-09-24: 150 mg via INTRAMUSCULAR

## 2013-10-21 ENCOUNTER — Encounter: Payer: Self-pay | Admitting: Internal Medicine

## 2013-11-07 ENCOUNTER — Encounter: Payer: Self-pay | Admitting: Internal Medicine

## 2013-11-07 ENCOUNTER — Ambulatory Visit (INDEPENDENT_AMBULATORY_CARE_PROVIDER_SITE_OTHER): Payer: BC Managed Care – PPO | Admitting: Internal Medicine

## 2013-11-07 VITALS — BP 120/80 | HR 84 | Temp 98.4°F | Ht 63.5 in | Wt 201.2 lb

## 2013-11-07 DIAGNOSIS — IMO0002 Reserved for concepts with insufficient information to code with codable children: Secondary | ICD-10-CM

## 2013-11-07 DIAGNOSIS — R6889 Other general symptoms and signs: Secondary | ICD-10-CM

## 2013-11-07 DIAGNOSIS — F439 Reaction to severe stress, unspecified: Secondary | ICD-10-CM

## 2013-11-07 DIAGNOSIS — E669 Obesity, unspecified: Secondary | ICD-10-CM

## 2013-11-07 DIAGNOSIS — D649 Anemia, unspecified: Secondary | ICD-10-CM

## 2013-11-07 DIAGNOSIS — E7800 Pure hypercholesterolemia, unspecified: Secondary | ICD-10-CM

## 2013-11-07 DIAGNOSIS — K219 Gastro-esophageal reflux disease without esophagitis: Secondary | ICD-10-CM

## 2013-11-07 DIAGNOSIS — E119 Type 2 diabetes mellitus without complications: Secondary | ICD-10-CM

## 2013-11-07 DIAGNOSIS — Z733 Stress, not elsewhere classified: Secondary | ICD-10-CM

## 2013-11-07 DIAGNOSIS — B977 Papillomavirus as the cause of diseases classified elsewhere: Secondary | ICD-10-CM

## 2013-11-07 DIAGNOSIS — I10 Essential (primary) hypertension: Secondary | ICD-10-CM

## 2013-11-07 NOTE — Progress Notes (Signed)
Pre visit review using our clinic review tool, if applicable. No additional management support is needed unless otherwise documented below in the visit note. 

## 2013-11-11 ENCOUNTER — Encounter: Payer: Self-pay | Admitting: Internal Medicine

## 2013-11-11 DIAGNOSIS — E669 Obesity, unspecified: Secondary | ICD-10-CM | POA: Insufficient documentation

## 2013-11-11 DIAGNOSIS — F439 Reaction to severe stress, unspecified: Secondary | ICD-10-CM | POA: Insufficient documentation

## 2013-11-11 NOTE — Assessment & Plan Note (Signed)
Hgb last checked and wnl.  Ferritin wnl.   Follow hgb and ferritin.  Has had GI w/up including EGD and colonoscopy 5/08.  Only taking iron twice a week.  Recheck.  If normal, will stop iron supplements and see if she can maintain iron stores.

## 2013-11-11 NOTE — Assessment & Plan Note (Signed)
Low cholesterol diet and exercise.  Follow.  Check lipid panel with next labs.    

## 2013-11-11 NOTE — Assessment & Plan Note (Signed)
Blood pressure as outlined.  Check metabolic panel.  Follow pressures.  Same medication regimen.

## 2013-11-11 NOTE — Assessment & Plan Note (Signed)
Discussed diet, exercise and weight loss.  She plans to get more serious about her diet and exercise.  Follow.

## 2013-11-11 NOTE — Assessment & Plan Note (Signed)
Low carb diet and exercise.  She plans to get more serious about her diet and exercise.  Check met b and a1c.  Keep up to date with eye exams.

## 2013-11-11 NOTE — Assessment & Plan Note (Signed)
Doing better.  Discussed tapering off prozac.  Tapering schedule given.  Follow.

## 2013-11-11 NOTE — Assessment & Plan Note (Signed)
On dexilant.  Has had EGD previously.  Follow.

## 2013-11-11 NOTE — Assessment & Plan Note (Signed)
Recommend repeat pap one year.  Colposcopy 4/15 - negative.

## 2013-11-11 NOTE — Progress Notes (Signed)
Subjective:    Patient ID: Anne [name], female    DOB: 1965-11-29, 48 y.o.   MRN: [id number]  HPI 48 year old female with past history of IBS, gastritis, hypertension and diabetes who comes in today for a scheduled follow up.  She states she is doing relatively well.  Had previously been under increased stress with her husband and the legal issues.  This is better now.  She feels she is handling things relatively well.  She would like to taper off her prozac.  We discussed this and discussed a plan for taper.  She has not been exercising.  Plans to restart.  Discussed with her today as well.  She is taking Dexilant now.  No reported increased acid reflux today.  No significant problems swallowing.  Trying to watch what she eats.  Plans to do better.  Bowels stable.  Overall she feels good.  Just concerned regarding her weight.    Past Medical History  Diagnosis Date  . Hypertension   . Diabetes mellitus   . Hypercholesterolemia   . Anemia   . History of abnormal Pap smear     s/p LEEP, h/o HPV  . IBS (irritable bowel syndrome)   . Gastritis     Current Outpatient Prescriptions on File Prior to Visit  Medication Sig Dispense Refill  . dexlansoprazole (DEXILANT) 60 MG capsule Take 60 mg by mouth daily.      . fish oil-omega-3 fatty acids 1000 MG capsule Take 1 g by mouth daily.      Marland Kitchen FLUoxetine (PROZAC) 10 MG capsule TAKE 2 CAPSULES BY MOUTH EVERY DAY  180 capsule  0  . fluticasone (FLONASE) 50 MCG/ACT nasal spray Place 2 sprays into the nose daily.  16 g  4  . glucose blood test strip One touch ultra test strip Check blood sugar q day      . losartan (COZAAR) 25 MG tablet TAKE 1 TABLET (25 MG TOTAL) BY MOUTH DAILY.  90 tablet  1  . medroxyPROGESTERone (DEPO-PROVERA) 150 MG/ML injection INJECT 1 ML INTRAMUSCULARLY EVERY THREE MONTHS  1 mL  3  . Multiple Vitamin (MULTIVITAMIN) tablet Take 1 tablet by mouth daily.       No current facility-administered medications on file prior to visit.     Review of Systems Patient denies any headache, lightheadedness or dizziness.  No significant sinus or allergy problems.  No chest pain, tightness or palpitations.  No increased shortness of breath, cough or congestion.  No nausea or vomiting.  On Dexilant.  No abdominal pain or cramping.  No bowel change, such as diarrhea, constipation, BRBPR or melana.  No urine change.   Feels she is handling the stress relatively well.  Not exercising.  Plans to restart.  Plans to do better watching what she eats.      Objective:   Physical Exam  Filed Vitals:   11/07/13 1609  BP: 120/80  Pulse: 84  Temp: 98.4 F (36.9 C)   Blood pressure recheck:  122/82, pulse 11  48 year old female in no acute distress.   HEENT:  Nares- clear.  Oropharynx - without lesions. NECK:  Supple.  Nontender.  No audible bruit.  HEART:  Appears to be regular. LUNGS:  No crackles or wheezing audible.  Respirations even and unlabored.  RADIAL PULSE:  Equal bilaterally.   ABDOMEN:  Soft, nontender.  Bowel sounds present and normal.  No audible abdominal bruit.   EXTREMITIES:  No increased edema present.  DP pulses palpable and equal bilaterally.   FEET:  No lesions.           Assessment & Plan:  GYN.  Had two positive HPV paps.  Saw Dr Patton SallesWeaver-Lee. Remains on Depo.  Repeat pap here 08/15/13 with positive HPV.  Referred back to gyn.     HEALTH MAINTENANCE.  Pap as outlined above.  Mammogram 07/18/13 - BiRDADS I.  Colonoscopy 10/23/06.    I spent 25 minutes with the patient and more than 50% of the time was spent in consultation regarding the above (including discussion about tapering prozac, diet and exercise and her iron and blood pressure issues).

## 2013-12-17 ENCOUNTER — Other Ambulatory Visit (INDEPENDENT_AMBULATORY_CARE_PROVIDER_SITE_OTHER): Payer: BC Managed Care – PPO

## 2013-12-17 ENCOUNTER — Ambulatory Visit (INDEPENDENT_AMBULATORY_CARE_PROVIDER_SITE_OTHER): Payer: BC Managed Care – PPO | Admitting: *Deleted

## 2013-12-17 DIAGNOSIS — E7800 Pure hypercholesterolemia, unspecified: Secondary | ICD-10-CM

## 2013-12-17 DIAGNOSIS — E119 Type 2 diabetes mellitus without complications: Secondary | ICD-10-CM

## 2013-12-17 DIAGNOSIS — Z3049 Encounter for surveillance of other contraceptives: Secondary | ICD-10-CM

## 2013-12-17 DIAGNOSIS — Z3042 Encounter for surveillance of injectable contraceptive: Secondary | ICD-10-CM

## 2013-12-17 DIAGNOSIS — D649 Anemia, unspecified: Secondary | ICD-10-CM

## 2013-12-17 LAB — HEPATIC FUNCTION PANEL
ALT: 29 U/L (ref 0–35)
AST: 24 U/L (ref 0–37)
Albumin: 3.5 g/dL (ref 3.5–5.2)
Alkaline Phosphatase: 76 U/L (ref 39–117)
BILIRUBIN DIRECT: 0.1 mg/dL (ref 0.0–0.3)
Total Bilirubin: 0.5 mg/dL (ref 0.2–1.2)
Total Protein: 6.9 g/dL (ref 6.0–8.3)

## 2013-12-17 LAB — LIPID PANEL
Cholesterol: 178 mg/dL (ref 0–200)
HDL: 32.3 mg/dL — ABNORMAL LOW (ref >39.00)
LDL Cholesterol: 78 mg/dL (ref 0–99)
NONHDL: 145.7
Total CHOL/HDL Ratio: 6
Triglycerides: 340 mg/dL — ABNORMAL HIGH (ref 0.0–149.0)
VLDL: 68 mg/dL — ABNORMAL HIGH (ref 0.0–40.0)

## 2013-12-17 LAB — CBC WITH DIFFERENTIAL/PLATELET
BASOS ABS: 0 10*3/uL (ref 0.0–0.1)
Basophils Relative: 0.6 % (ref 0.0–3.0)
EOS ABS: 0.1 10*3/uL (ref 0.0–0.7)
Eosinophils Relative: 1.7 % (ref 0.0–5.0)
HCT: 41.2 % (ref 36.0–46.0)
Hemoglobin: 13.9 g/dL (ref 12.0–15.0)
LYMPHS PCT: 33.3 % (ref 12.0–46.0)
Lymphs Abs: 1.8 10*3/uL (ref 0.7–4.0)
MCHC: 33.8 g/dL (ref 30.0–36.0)
MCV: 89.3 fl (ref 78.0–100.0)
Monocytes Absolute: 0.4 10*3/uL (ref 0.1–1.0)
Monocytes Relative: 7.9 % (ref 3.0–12.0)
NEUTROS PCT: 56.5 % (ref 43.0–77.0)
Neutro Abs: 3 10*3/uL (ref 1.4–7.7)
PLATELETS: 202 10*3/uL (ref 150.0–400.0)
RBC: 4.61 Mil/uL (ref 3.87–5.11)
RDW: 13.5 % (ref 11.5–15.5)
WBC: 5.3 10*3/uL (ref 4.0–10.5)

## 2013-12-17 LAB — BASIC METABOLIC PANEL
BUN: 12 mg/dL (ref 6–23)
CHLORIDE: 108 meq/L (ref 96–112)
CO2: 22 meq/L (ref 19–32)
Calcium: 8.9 mg/dL (ref 8.4–10.5)
Creatinine, Ser: 0.8 mg/dL (ref 0.4–1.2)
GFR: 79.78 mL/min (ref >60.00)
Glucose, Bld: 138 mg/dL — ABNORMAL HIGH (ref 70–99)
Potassium: 4.1 mEq/L (ref 3.5–5.1)
SODIUM: 139 meq/L (ref 135–145)

## 2013-12-17 LAB — HEMOGLOBIN A1C: Hgb A1c MFr Bld: 6.7 % — ABNORMAL HIGH (ref 4.6–6.5)

## 2013-12-17 LAB — FERRITIN: FERRITIN: 24.4 ng/mL (ref 10.0–291.0)

## 2013-12-17 MED ORDER — MEDROXYPROGESTERONE ACETATE 150 MG/ML IM SUSP
150.0000 mg | Freq: Once | INTRAMUSCULAR | Status: AC
  Administered 2013-12-17: 150 mg via INTRAMUSCULAR

## 2013-12-18 ENCOUNTER — Encounter: Payer: Self-pay | Admitting: Internal Medicine

## 2014-01-06 ENCOUNTER — Encounter: Payer: Self-pay | Admitting: Internal Medicine

## 2014-01-17 ENCOUNTER — Other Ambulatory Visit: Payer: Self-pay | Admitting: Internal Medicine

## 2014-01-20 ENCOUNTER — Encounter: Payer: Self-pay | Admitting: Internal Medicine

## 2014-01-22 ENCOUNTER — Other Ambulatory Visit: Payer: Self-pay | Admitting: Internal Medicine

## 2014-01-23 ENCOUNTER — Telehealth: Payer: Self-pay | Admitting: *Deleted

## 2014-01-23 NOTE — Telephone Encounter (Signed)
Fax from [facility], Dexilant needing PA. Began online

## 2014-01-23 NOTE — Telephone Encounter (Signed)
Preferred options are Omeprazole, Nexium, Prilosec OTC

## 2014-01-28 ENCOUNTER — Telehealth: Payer: Self-pay | Admitting: *Deleted

## 2014-01-28 NOTE — Telephone Encounter (Signed)
Pt called to check on the status of her Dexilant PA. She was notified that we started it on 01/23/14. Pt states that she has tried Nexium  daily for years & d/c due to headache & diarrhea. She has also tried Pirlosec & Omeprazole. None of those helped. Patient is currently on Dexilant & it has been the most helpful.

## 2014-01-29 NOTE — Telephone Encounter (Signed)
PA initiated online

## 2014-01-31 NOTE — Telephone Encounter (Signed)
Received a denial letter (placed in your folder)

## 2014-02-01 NOTE — Telephone Encounter (Signed)
Notify pt that her insurance has denied coverage for dexilant.  Per her insurance, she has to try all step I prescriptions.  To my knowledge, she has not tried protonix.   Can change dexilant to protonix  q day.  Will follow.  Let us know if break through symptoms.

## 2014-02-03 ENCOUNTER — Other Ambulatory Visit: Payer: Self-pay | Admitting: *Deleted

## 2014-02-03 MED ORDER — PANTOPRAZOLE SODIUM 40 MG PO TBEC: 40.0000 mg | DELAYED_RELEASE_TABLET | Freq: Every day | ORAL | Status: DC

## 2014-02-03 NOTE — Telephone Encounter (Signed)
Pt. Notified & Protonix sent to pharmacy

## 2014-02-05 ENCOUNTER — Encounter: Payer: Self-pay | Admitting: Internal Medicine

## 2014-02-13 ENCOUNTER — Encounter: Payer: Self-pay | Admitting: *Deleted

## 2014-02-14 ENCOUNTER — Telehealth: Payer: Self-pay | Admitting: Internal Medicine

## 2014-02-14 NOTE — Telephone Encounter (Signed)
Pt.notified

## 2014-02-14 NOTE — Telephone Encounter (Signed)
Patient Information:  Caller Name: Ameliana  Phone: [phone]  Patient: Anne [name]  Gender: Female  DOB: 01-05-1966  Age: 48 Years  PCP: Dale Chilton  Pregnant: No  Office Follow Up:  Does the office need to follow up with this patient?: Yes  Instructions For The Office: side effects of protonix  RN Note:  Patient is calling regarding recent change in medication. Starting the Protonix  5 days ago, and now  C/O dry cough and headache.  Agrees to stay with plan of care if provider feels these symptoms will subside.  Otherwise needs call back with plan of care.  Symptoms  Reason For Call & Symptoms: headache with Protonix  Reviewed Health History In EMR: Yes  Reviewed Medications In EMR: Yes  Reviewed Allergies In EMR: Yes  Reviewed Surgeries / Procedures: Yes  Date of Onset of Symptoms: 02/14/2014 OB / GYN:  LMP: Unknown  Guideline(s) Used:  No Protocol Available - Information Only  Disposition Per Guideline:   Discuss with PCP and Callback by Nurse Today  Reason For Disposition Reached:   Nursing judgment  Advice Given:  N/A  Patient Will Follow Care Advice:  YES

## 2014-02-14 NOTE — Telephone Encounter (Signed)
FYI

## 2014-02-14 NOTE — Telephone Encounter (Signed)
See below

## 2014-02-14 NOTE — Telephone Encounter (Signed)
I am not sure if the symptoms are related to changing to the protonix.  Could be allergies, etc.  Can try to add zantac in the pm (before evening meal) and continue protonix once a day and see if symptoms resolve.  If feels related to medication, can try a different PPI.  She has to try all listed before they will see about authorizing.   Just let me know if problems.

## 2014-03-03 ENCOUNTER — Encounter: Payer: Self-pay | Admitting: Internal Medicine

## 2014-03-10 ENCOUNTER — Ambulatory Visit (INDEPENDENT_AMBULATORY_CARE_PROVIDER_SITE_OTHER): Payer: BC Managed Care – PPO | Admitting: Internal Medicine

## 2014-03-10 ENCOUNTER — Encounter: Payer: Self-pay | Admitting: Internal Medicine

## 2014-03-10 VITALS — BP 110/78 | HR 79 | Temp 98.5°F | Ht 63.5 in | Wt 200.5 lb

## 2014-03-10 DIAGNOSIS — F439 Reaction to severe stress, unspecified: Secondary | ICD-10-CM

## 2014-03-10 DIAGNOSIS — E119 Type 2 diabetes mellitus without complications: Secondary | ICD-10-CM

## 2014-03-10 DIAGNOSIS — IMO0002 Reserved for concepts with insufficient information to code with codable children: Secondary | ICD-10-CM

## 2014-03-10 DIAGNOSIS — E7800 Pure hypercholesterolemia, unspecified: Secondary | ICD-10-CM

## 2014-03-10 DIAGNOSIS — B977 Papillomavirus as the cause of diseases classified elsewhere: Secondary | ICD-10-CM

## 2014-03-10 DIAGNOSIS — E669 Obesity, unspecified: Secondary | ICD-10-CM

## 2014-03-10 DIAGNOSIS — K219 Gastro-esophageal reflux disease without esophagitis: Secondary | ICD-10-CM

## 2014-03-10 DIAGNOSIS — Z309 Encounter for contraceptive management, unspecified: Secondary | ICD-10-CM

## 2014-03-10 DIAGNOSIS — R888 Abnormal findings in other body fluids and substances: Secondary | ICD-10-CM

## 2014-03-10 DIAGNOSIS — Z658 Other specified problems related to psychosocial circumstances: Secondary | ICD-10-CM

## 2014-03-10 DIAGNOSIS — D649 Anemia, unspecified: Secondary | ICD-10-CM

## 2014-03-10 MED ORDER — MEDROXYPROGESTERONE ACETATE 150 MG/ML IM SUSP
150.0000 mg | Freq: Once | INTRAMUSCULAR | Status: AC
  Administered 2014-03-10: 150 mg via INTRAMUSCULAR

## 2014-03-10 NOTE — Progress Notes (Signed)
Pre visit review using our clinic review tool, if applicable. No additional management support is needed unless otherwise documented below in the visit note. 

## 2014-03-10 NOTE — Progress Notes (Signed)
Subjective:    Patient ID: Anne [name], female    DOB: 1966-05-13, 48 y.o.   MRN: [id number]  HPI 48 year old female with past history of IBS, gastritis, hypertension and diabetes who comes in today for a scheduled follow up.  She states she is doing relatively well.  Had previously been under increased stress with her husband and the legal issues.  This is better now.  Now with increased stress at work.  Her sister has also been diagnosed with breast cancer.  Undergoing chemotherapy.  Increased stress related to this.   She is back on prozac and feels this is helping.  Does not feel she needs anything more.  She has not been exercising.  Plans to restart.  Discussed with her today as well.  She is taking protonix now.  Insurance would not cover the dexilant.  Still needs zantac in the evening on occasion.  Trying to not eat late.   No significant problems swallowing.  Trying to watch what she eats.  Plans to do better.  Bowels stable.  Saw Dr Valentino Saxon. S/p colposcopy.  States everything checked out fine.  Recommended f/u in one year (colpo 4/14).     Past Medical History  Diagnosis Date  . Hypertension   . Diabetes mellitus   . Hypercholesterolemia   . Anemia   . History of abnormal Pap smear     s/p LEEP, h/o HPV  . IBS (irritable bowel syndrome)   . Gastritis     Current Outpatient Prescriptions on File Prior to Visit  Medication Sig Dispense Refill  . fish oil-omega-3 fatty acids 1000 MG capsule Take 1 g by mouth daily.      Marland Kitchen FLUoxetine (PROZAC) 10 MG capsule TAKE 2 CAPSULES BY MOUTH EVERY DAY  180 capsule  0  . fluticasone (FLONASE) 50 MCG/ACT nasal spray Place 2 sprays into the nose daily.  16 g  4  . glucose blood test strip One touch ultra test strip Check blood sugar q day      . losartan (COZAAR) 25 MG tablet TAKE 1 TABLET (25 MG TOTAL) BY MOUTH DAILY.  90 tablet  1  . medroxyPROGESTERone (DEPO-PROVERA) 150 MG/ML injection INJECT 1 ML INTRAMUSCULARLY EVERY THREE MONTHS  1 mL   3  . Multiple Vitamin (MULTIVITAMIN) tablet Take 1 tablet by mouth daily.      . pantoprazole (PROTONIX) 40 MG tablet Take 1 tablet (40 mg total) by mouth daily.  30 tablet  5   No current facility-administered medications on file prior to visit.    Review of Systems Patient denies any headache, lightheadedness or dizziness.  No significant sinus or allergy problems.  No chest pain, tightness or palpitations.  No increased shortness of breath, cough or congestion.  No nausea or vomiting.  On protonix.  Acid reflux as outlined.   No abdominal pain or cramping.  No bowel change, such as diarrhea, constipation, BRBPR or melana.  No urine change.   Feels she is handling the stress relatively well.  Back on prozac and feels this is working well for her.  Not exercising.  Plans to restart.  Plans to do better watching what she eats.      Objective:   Physical Exam  Filed Vitals:   03/10/14 0925  BP: 110/78  Pulse: 79  Temp: 98.5 F (36.9 C)   Blood pressure recheck:  48/57  48 year old female in no acute distress.   HEENT:  Nares- clear.  Oropharynx - without lesions. NECK:  Supple.  Nontender.  No audible bruit.  HEART:  Appears to be regular. LUNGS:  No crackles or wheezing audible.  Respirations even and unlabored.  RADIAL PULSE:  Equal bilaterally.   ABDOMEN:  Soft, nontender.  Bowel sounds present and normal.  No audible abdominal bruit.   EXTREMITIES:  No increased edema present.  DP pulses palpable and equal bilaterally.   FEET:  No lesions.           Assessment & Plan:  GYN.  Had two positive HPV paps.  Saw Dr Patton SallesWeaver-Lee. Remains on Depo.  Repeat pap here 08/15/13 with positive HPV.  Referred back to gyn.   Saw Dr Valentino Saxonherry.  S/p colposcopy.  States everything ok.  Recommended f/u one year.  Colposcopy 4/14.    HEALTH MAINTENANCE.  Pap as outlined above.  Mammogram 07/18/13 - BiRDADS I.  Colonoscopy 10/23/06.    I spent 25 minutes with the patient and more than 50% of the time was  spent in consultation regarding the above.

## 2014-03-11 ENCOUNTER — Encounter: Payer: Self-pay | Admitting: Internal Medicine

## 2014-03-11 NOTE — Assessment & Plan Note (Signed)
Back on prozac.  Doing better.  Does not feel she needs any further intervention at this time.  Follow.

## 2014-03-11 NOTE — Assessment & Plan Note (Addendum)
Low carb diet and exercise.  She plans to get more serious about her diet and exercise.  Follow  met b and a1c.  Keep up to date with eye exams.  Saw Dr Ellin Mayhew 3/15.

## 2014-03-11 NOTE — Assessment & Plan Note (Signed)
Blood pressure as outlined.  Follow pressures.  Same medication regimen.

## 2014-03-11 NOTE — Assessment & Plan Note (Signed)
Hgb last checked wnl.  Ferritin wnl.   Follow hgb and ferritin.  Has had GI w/up including EGD and colonoscopy 5/08.  Only taking iron twice a week.  Follow cbc.

## 2014-03-11 NOTE — Assessment & Plan Note (Signed)
Now on protonix.  Insurance would not cover her dexilant.  Takes zantac in the pm prn.  Will have her start taking regularly.   Has had EGD previously.  Follow.  Will reassess for complete resolution at her next appt.

## 2014-03-11 NOTE — Assessment & Plan Note (Signed)
Recommend repeat pap one year.  Colposcopy 4/15 - negative.    

## 2014-03-11 NOTE — Assessment & Plan Note (Signed)
Discussed diet, exercise and weight loss.  She plans to get more serious about her diet and exercise.  Follow.   

## 2014-03-11 NOTE — Assessment & Plan Note (Signed)
Low cholesterol diet and exercise.  Follow.  Check lipid panel with next labs.    

## 2014-03-19 ENCOUNTER — Telehealth: Payer: Self-pay

## 2014-03-19 ENCOUNTER — Other Ambulatory Visit: Payer: Self-pay | Admitting: *Deleted

## 2014-03-19 MED ORDER — PANTOPRAZOLE SODIUM 40 MG PO TBEC: 40.0000 mg | DELAYED_RELEASE_TABLET | Freq: Every day | ORAL | Status: DC

## 2014-03-19 NOTE — Telephone Encounter (Signed)
The patient is hoping to get her protonix rx changed from a 30 day supply to a 90 day supply.

## 2014-03-19 NOTE — Telephone Encounter (Signed)
Rx sent for 90 day supply.

## 2014-04-18 ENCOUNTER — Encounter: Payer: Self-pay | Admitting: Internal Medicine

## 2014-04-25 ENCOUNTER — Other Ambulatory Visit: Payer: Self-pay | Admitting: Internal Medicine

## 2014-05-21 ENCOUNTER — Encounter: Payer: Self-pay | Admitting: Internal Medicine

## 2014-05-28 ENCOUNTER — Ambulatory Visit (INDEPENDENT_AMBULATORY_CARE_PROVIDER_SITE_OTHER): Payer: BC Managed Care – PPO | Admitting: *Deleted

## 2014-05-28 ENCOUNTER — Other Ambulatory Visit (INDEPENDENT_AMBULATORY_CARE_PROVIDER_SITE_OTHER): Payer: BC Managed Care – PPO

## 2014-05-28 DIAGNOSIS — Z3042 Encounter for surveillance of injectable contraceptive: Secondary | ICD-10-CM

## 2014-05-28 DIAGNOSIS — E7800 Pure hypercholesterolemia, unspecified: Secondary | ICD-10-CM

## 2014-05-28 DIAGNOSIS — E119 Type 2 diabetes mellitus without complications: Secondary | ICD-10-CM

## 2014-05-28 DIAGNOSIS — D649 Anemia, unspecified: Secondary | ICD-10-CM

## 2014-05-28 LAB — CBC WITH DIFFERENTIAL/PLATELET
BASOS ABS: 0 10*3/uL (ref 0.0–0.1)
Basophils Relative: 0.6 % (ref 0.0–3.0)
EOS PCT: 2.1 % (ref 0.0–5.0)
Eosinophils Absolute: 0.1 10*3/uL (ref 0.0–0.7)
HEMATOCRIT: 42.7 % (ref 36.0–46.0)
HEMOGLOBIN: 14.2 g/dL (ref 12.0–15.0)
LYMPHS ABS: 1.8 10*3/uL (ref 0.7–4.0)
Lymphocytes Relative: 35.6 % (ref 12.0–46.0)
MCHC: 33.2 g/dL (ref 30.0–36.0)
MCV: 90 fl (ref 78.0–100.0)
MONOS PCT: 7.8 % (ref 3.0–12.0)
Monocytes Absolute: 0.4 10*3/uL (ref 0.1–1.0)
NEUTROS ABS: 2.7 10*3/uL (ref 1.4–7.7)
Neutrophils Relative %: 53.9 % (ref 43.0–77.0)
Platelets: 217 10*3/uL (ref 150.0–400.0)
RBC: 4.75 Mil/uL (ref 3.87–5.11)
RDW: 13.9 % (ref 11.5–15.5)
WBC: 5 10*3/uL (ref 4.0–10.5)

## 2014-05-28 LAB — COMPREHENSIVE METABOLIC PANEL
ALBUMIN: 3.8 g/dL (ref 3.5–5.2)
ALT: 36 U/L — AB (ref 0–35)
AST: 28 U/L (ref 0–37)
Alkaline Phosphatase: 73 U/L (ref 39–117)
BUN: 14 mg/dL (ref 6–23)
CALCIUM: 9 mg/dL (ref 8.4–10.5)
CO2: 21 meq/L (ref 19–32)
Chloride: 108 mEq/L (ref 96–112)
Creatinine, Ser: 0.9 mg/dL (ref 0.4–1.2)
GFR: 70.61 mL/min (ref >60.00)
Glucose, Bld: 148 mg/dL — ABNORMAL HIGH (ref 70–99)
POTASSIUM: 4 meq/L (ref 3.5–5.1)
Sodium: 136 mEq/L (ref 135–145)
Total Bilirubin: 0.4 mg/dL (ref 0.2–1.2)
Total Protein: 7.2 g/dL (ref 6.0–8.3)

## 2014-05-28 LAB — LIPID PANEL
Cholesterol: 245 mg/dL — ABNORMAL HIGH (ref 0–200)
HDL: 33.3 mg/dL — ABNORMAL LOW (ref >39.00)
NONHDL: 211.7
Total CHOL/HDL Ratio: 7
Triglycerides: 262 mg/dL — ABNORMAL HIGH (ref 0.0–149.0)
VLDL: 52.4 mg/dL — ABNORMAL HIGH (ref 0.0–40.0)

## 2014-05-28 LAB — FERRITIN: Ferritin: 28.2 ng/mL (ref 10.0–291.0)

## 2014-05-28 LAB — LDL CHOLESTEROL, DIRECT: Direct LDL: 175.6 mg/dL

## 2014-05-28 LAB — HEMOGLOBIN A1C: HEMOGLOBIN A1C: 6.9 % — AB (ref 4.6–6.5)

## 2014-05-28 MED ORDER — MEDROXYPROGESTERONE ACETATE 150 MG/ML IM SUSP
150.0000 mg | Freq: Once | INTRAMUSCULAR | Status: AC
  Administered 2014-05-28: 150 mg via INTRAMUSCULAR

## 2014-05-29 ENCOUNTER — Encounter: Payer: Self-pay | Admitting: Internal Medicine

## 2014-06-04 ENCOUNTER — Ambulatory Visit (INDEPENDENT_AMBULATORY_CARE_PROVIDER_SITE_OTHER): Payer: BC Managed Care – PPO | Admitting: Internal Medicine

## 2014-06-04 ENCOUNTER — Encounter: Payer: Self-pay | Admitting: Internal Medicine

## 2014-06-04 VITALS — BP 113/79 | HR 88 | Temp 98.3°F | Ht 63.5 in | Wt 202.5 lb

## 2014-06-04 DIAGNOSIS — B977 Papillomavirus as the cause of diseases classified elsewhere: Secondary | ICD-10-CM

## 2014-06-04 DIAGNOSIS — F439 Reaction to severe stress, unspecified: Secondary | ICD-10-CM

## 2014-06-04 DIAGNOSIS — IMO0002 Reserved for concepts with insufficient information to code with codable children: Secondary | ICD-10-CM

## 2014-06-04 DIAGNOSIS — E119 Type 2 diabetes mellitus without complications: Secondary | ICD-10-CM

## 2014-06-04 DIAGNOSIS — D649 Anemia, unspecified: Secondary | ICD-10-CM

## 2014-06-04 DIAGNOSIS — R888 Abnormal findings in other body fluids and substances: Secondary | ICD-10-CM

## 2014-06-04 DIAGNOSIS — I10 Essential (primary) hypertension: Secondary | ICD-10-CM

## 2014-06-04 DIAGNOSIS — E669 Obesity, unspecified: Secondary | ICD-10-CM

## 2014-06-04 DIAGNOSIS — E7800 Pure hypercholesterolemia, unspecified: Secondary | ICD-10-CM

## 2014-06-04 DIAGNOSIS — Z658 Other specified problems related to psychosocial circumstances: Secondary | ICD-10-CM

## 2014-06-04 DIAGNOSIS — K219 Gastro-esophageal reflux disease without esophagitis: Secondary | ICD-10-CM

## 2014-06-04 NOTE — Progress Notes (Signed)
Subjective:    Patient ID: Anne [name], female    DOB: 25-Jul-1965, 48 y.o.   MRN: [id number]  HPI 48 year old female with past history of IBS, gastritis, hypertension and diabetes who comes in today for a scheduled follow up.  She states she is doing relatively well.  Had previously been under increased stress with her husband and the legal issues.  Also increased stress with work and with her sister's health issues.  She is taking prozac.  Does not feel she needs anything more.  She has not been exercising.  Plans to restart.  Discussed with her today the importance of diet and exercise.  Sugars increasing.  A1c just checked - 6.9.  She is taking protonix now.  Insurance would not cover the dexilant.  Still needs zantac in the evening on occasion.  Trying to not eat late.   Does have flares if eats late.  No significant problems swallowing.  Bowels stable.  Saw Dr Marcelline Mates. S/p colposcopy.  States everything checked out fine.  Recommended f/u in one year (colpo 4/14).     Past Medical History  Diagnosis Date  . Hypertension   . Diabetes mellitus   . Hypercholesterolemia   . Anemia   . History of abnormal Pap smear     s/p LEEP, h/o HPV  . IBS (irritable bowel syndrome)   . Gastritis     Current Outpatient Prescriptions on File Prior to Visit  Medication Sig Dispense Refill  . fish oil-omega-3 fatty acids 1000 MG capsule Take 1 g by mouth daily.    Marland Kitchen FLUoxetine (PROZAC) 10 MG capsule TAKE 2 CAPSULES BY MOUTH EVERY DAY 180 capsule 0  . fluticasone (FLONASE) 50 MCG/ACT nasal spray Place 2 sprays into the nose daily. 16 g 4  . glucose blood test strip One touch ultra test strip Check blood sugar q day    . losartan (COZAAR) 25 MG tablet TAKE 1 TABLET (25 MG TOTAL) BY MOUTH DAILY. 90 tablet 1  . medroxyPROGESTERone (DEPO-PROVERA) 150 MG/ML injection INJECT 1 ML INTRAMUSCULARLY EVERY THREE MONTHS 1 mL 3  . Multiple Vitamin (MULTIVITAMIN) tablet Take 1 tablet by mouth daily.    .  pantoprazole (PROTONIX) 40 MG tablet Take 1 tablet (40 mg total) by mouth daily. 90 tablet 1   No current facility-administered medications on file prior to visit.    Review of Systems Patient denies any headache, lightheadedness or dizziness.  No significant sinus or allergy problems.  No chest pain, tightness or palpitations.  No increased shortness of breath, cough or congestion.  No nausea or vomiting.  On protonix.  Acid reflux as outlined.   No abdominal pain or cramping.  No bowel change, such as diarrhea, constipation, BRBPR or melana.  No urine change.   Feels she is handling the stress relatively well.  On prozac and feels this is working well for her.  Not exercising.  Plans to restart.  Plans to do better watching what she eats.   A1c as outlined.        Objective:   Physical Exam  Filed Vitals:   06/04/14 0753  BP: 113/79  Pulse: 88  Temp: 98.3 F (45.58 C)   48 year old female in no acute distress.   HEENT:  Nares- clear.  Oropharynx - without lesions. NECK:  Supple.  Nontender.  No audible bruit.  HEART:  Appears to be regular. LUNGS:  No crackles or wheezing audible.  Respirations even and unlabored.  RADIAL PULSE:  Equal bilaterally.   ABDOMEN:  Soft, nontender.  Bowel sounds present and normal.  No audible abdominal bruit.   EXTREMITIES:  No increased edema present.  DP pulses palpable and equal bilaterally.   FEET:  No lesions.           Assessment & Plan:  1. Essential hypertension Blood pressure as outlined.  Follow pressures.  Same medication regimen.  Follow.   - Basic metabolic panel; Future  2. Gastroesophageal reflux disease, esophagitis presence not specified On protonix.  Only has problems if eats late.  Zantac in the evening.  If avoids eating late, has no problems.    3. Type 2 diabetes mellitus without complication Discussed diet and exercise.  Discussed importance of weight loss.  Follow met b and a1c.   - TSH; Future - Hemoglobin A1c; Future -  Microalbumin / creatinine urine ratio; Future - Hepatic function panel; Future  4. Hypercholesterolemia Low cholesterol diet and exercise.  Follow lipid panel.  Discussed my desire to start cholesterol medication.  She declines.  Wants to continue diet and exercise.   - Lipid panel; Future  5. Anemia, unspecified anemia type Hgb just checked and wnl.   - CBC with Differential; Future - Ferritin; Future  6. HPV test positive Referred to gyn.  S/p colposcopy.  Negative.  Recommended f/u pap in one year.    7. Obesity (BMI 30-39.9) Diet and exercise.    8. Stress Overall appears to be handling stress relatively well.  Follow.  On prozac.    9. GYN.  Had two positive HPV paps.  Saw Dr Ferne Reus. Remains on Depo.  Repeat pap here 08/15/13 with positive HPV.  Referred back to gyn.   Saw Dr Marcelline Mates.  S/p colposcopy.  States everything ok.  Recommended f/u one year.    HEALTH MAINTENANCE.  Pap as outlined above.  Mammogram 07/18/13 - BiRDADS I.  Colonoscopy 10/23/06.    I spent 25 minutes with the patient and more than 50% of the time was spent in consultation regarding the above.

## 2014-06-04 NOTE — Progress Notes (Signed)
Pre visit review using our clinic review tool, if applicable. No additional management support is needed unless otherwise documented below in the visit note. 

## 2014-06-08 ENCOUNTER — Other Ambulatory Visit: Payer: Self-pay | Admitting: Internal Medicine

## 2014-06-08 ENCOUNTER — Encounter: Payer: Self-pay | Admitting: Internal Medicine

## 2014-06-08 DIAGNOSIS — Z1239 Encounter for other screening for malignant neoplasm of breast: Secondary | ICD-10-CM

## 2014-06-08 NOTE — Progress Notes (Signed)
Order placed for mammogram.

## 2014-07-21 ENCOUNTER — Other Ambulatory Visit: Payer: Self-pay | Admitting: Internal Medicine

## 2014-07-22 ENCOUNTER — Ambulatory Visit: Payer: Self-pay | Admitting: Internal Medicine

## 2014-07-22 LAB — HM MAMMOGRAPHY: HM Mammogram: NEGATIVE

## 2014-08-28 ENCOUNTER — Other Ambulatory Visit (INDEPENDENT_AMBULATORY_CARE_PROVIDER_SITE_OTHER): Payer: BC Managed Care – PPO

## 2014-08-28 ENCOUNTER — Ambulatory Visit (INDEPENDENT_AMBULATORY_CARE_PROVIDER_SITE_OTHER): Payer: BC Managed Care – PPO | Admitting: *Deleted

## 2014-08-28 DIAGNOSIS — Z308 Encounter for other contraceptive management: Secondary | ICD-10-CM | POA: Diagnosis not present

## 2014-08-28 DIAGNOSIS — E7800 Pure hypercholesterolemia, unspecified: Secondary | ICD-10-CM

## 2014-08-28 DIAGNOSIS — D649 Anemia, unspecified: Secondary | ICD-10-CM | POA: Diagnosis not present

## 2014-08-28 DIAGNOSIS — E119 Type 2 diabetes mellitus without complications: Secondary | ICD-10-CM

## 2014-08-28 DIAGNOSIS — I10 Essential (primary) hypertension: Secondary | ICD-10-CM | POA: Diagnosis not present

## 2014-08-28 LAB — CBC WITH DIFFERENTIAL/PLATELET
BASOS ABS: 0 10*3/uL (ref 0.0–0.1)
Basophils Relative: 0.5 % (ref 0.0–3.0)
Eosinophils Absolute: 0.1 10*3/uL (ref 0.0–0.7)
Eosinophils Relative: 1.3 % (ref 0.0–5.0)
HEMATOCRIT: 41.3 % (ref 36.0–46.0)
Hemoglobin: 14 g/dL (ref 12.0–15.0)
LYMPHS ABS: 1.3 10*3/uL (ref 0.7–4.0)
Lymphocytes Relative: 24.3 % (ref 12.0–46.0)
MCHC: 34 g/dL (ref 30.0–36.0)
MCV: 88.8 fl (ref 78.0–100.0)
MONOS PCT: 7.6 % (ref 3.0–12.0)
Monocytes Absolute: 0.4 10*3/uL (ref 0.1–1.0)
NEUTROS PCT: 66.3 % (ref 43.0–77.0)
Neutro Abs: 3.7 10*3/uL (ref 1.4–7.7)
PLATELETS: 226 10*3/uL (ref 150.0–400.0)
RBC: 4.65 Mil/uL (ref 3.87–5.11)
RDW: 13.4 % (ref 11.5–15.5)
WBC: 5.5 10*3/uL (ref 4.0–10.5)

## 2014-08-28 LAB — LIPID PANEL
Cholesterol: 183 mg/dL (ref 0–200)
HDL: 40.4 mg/dL (ref >39.00)
NONHDL: 142.6
Total CHOL/HDL Ratio: 5
Triglycerides: 272 mg/dL — ABNORMAL HIGH (ref 0.0–149.0)
VLDL: 54.4 mg/dL — ABNORMAL HIGH (ref 0.0–40.0)

## 2014-08-28 LAB — BASIC METABOLIC PANEL
BUN: 15 mg/dL (ref 6–23)
CALCIUM: 9.1 mg/dL (ref 8.4–10.5)
CO2: 25 meq/L (ref 19–32)
CREATININE: 0.89 mg/dL (ref 0.40–1.20)
Chloride: 105 mEq/L (ref 96–112)
GFR: 72.36 mL/min (ref >60.00)
GLUCOSE: 138 mg/dL — AB (ref 70–99)
Potassium: 4.3 mEq/L (ref 3.5–5.1)
Sodium: 137 mEq/L (ref 135–145)

## 2014-08-28 LAB — FERRITIN: FERRITIN: 18.3 ng/mL (ref 10.0–291.0)

## 2014-08-28 LAB — HEPATIC FUNCTION PANEL
ALT: 30 U/L (ref 0–35)
AST: 20 U/L (ref 0–37)
Albumin: 3.8 g/dL (ref 3.5–5.2)
Alkaline Phosphatase: 78 U/L (ref 39–117)
Bilirubin, Direct: 0.1 mg/dL (ref 0.0–0.3)
TOTAL PROTEIN: 6.8 g/dL (ref 6.0–8.3)
Total Bilirubin: 0.4 mg/dL (ref 0.2–1.2)

## 2014-08-28 LAB — HEMOGLOBIN A1C: Hgb A1c MFr Bld: 6.7 % — ABNORMAL HIGH (ref 4.6–6.5)

## 2014-08-28 LAB — MICROALBUMIN / CREATININE URINE RATIO
CREATININE, U: 356.2 mg/dL
MICROALB/CREAT RATIO: 0.5 mg/g (ref 0.0–30.0)
Microalb, Ur: 1.7 mg/dL (ref 0.0–1.9)

## 2014-08-28 LAB — TSH: TSH: 0.9 u[IU]/mL (ref 0.35–4.50)

## 2014-08-28 LAB — LDL CHOLESTEROL, DIRECT: Direct LDL: 113 mg/dL

## 2014-08-28 MED ORDER — MEDROXYPROGESTERONE ACETATE 150 MG/ML IM SUSP
150.0000 mg | Freq: Once | INTRAMUSCULAR | Status: AC
Start: 2014-08-28 — End: 2014-08-28
  Administered 2014-08-28: 150 mg via INTRAMUSCULAR

## 2014-08-28 NOTE — Addendum Note (Signed)
Addended by: Cristela BluePULLIAM, Marcquis Ridlon W on: 08/28/2014 08:07 AM   Modules accepted: Orders

## 2014-08-30 ENCOUNTER — Encounter: Payer: Self-pay | Admitting: Internal Medicine

## 2014-09-05 ENCOUNTER — Ambulatory Visit (INDEPENDENT_AMBULATORY_CARE_PROVIDER_SITE_OTHER): Payer: BC Managed Care – PPO | Admitting: Internal Medicine

## 2014-09-05 ENCOUNTER — Encounter: Payer: Self-pay | Admitting: Internal Medicine

## 2014-09-05 VITALS — BP 115/81 | HR 81 | Temp 98.0°F | Ht 63.5 in | Wt 202.2 lb

## 2014-09-05 DIAGNOSIS — D649 Anemia, unspecified: Secondary | ICD-10-CM

## 2014-09-05 DIAGNOSIS — K219 Gastro-esophageal reflux disease without esophagitis: Secondary | ICD-10-CM | POA: Diagnosis not present

## 2014-09-05 DIAGNOSIS — IMO0002 Reserved for concepts with insufficient information to code with codable children: Secondary | ICD-10-CM

## 2014-09-05 DIAGNOSIS — Z0000 Encounter for general adult medical examination without abnormal findings: Secondary | ICD-10-CM

## 2014-09-05 DIAGNOSIS — E119 Type 2 diabetes mellitus without complications: Secondary | ICD-10-CM | POA: Diagnosis not present

## 2014-09-05 DIAGNOSIS — F439 Reaction to severe stress, unspecified: Secondary | ICD-10-CM

## 2014-09-05 DIAGNOSIS — I10 Essential (primary) hypertension: Secondary | ICD-10-CM | POA: Diagnosis not present

## 2014-09-05 DIAGNOSIS — E7800 Pure hypercholesterolemia, unspecified: Secondary | ICD-10-CM

## 2014-09-05 DIAGNOSIS — R888 Abnormal findings in other body fluids and substances: Secondary | ICD-10-CM

## 2014-09-05 DIAGNOSIS — Z658 Other specified problems related to psychosocial circumstances: Secondary | ICD-10-CM

## 2014-09-05 DIAGNOSIS — Z8371 Family history of colonic polyps: Secondary | ICD-10-CM

## 2014-09-05 DIAGNOSIS — E669 Obesity, unspecified: Secondary | ICD-10-CM

## 2014-09-05 DIAGNOSIS — B977 Papillomavirus as the cause of diseases classified elsewhere: Secondary | ICD-10-CM

## 2014-09-05 NOTE — Progress Notes (Signed)
Pre visit review using our clinic review tool, if applicable. No additional management support is needed unless otherwise documented below in the visit note. 

## 2014-09-08 ENCOUNTER — Encounter: Payer: Self-pay | Admitting: Internal Medicine

## 2014-09-08 DIAGNOSIS — Z0000 Encounter for general adult medical examination without abnormal findings: Secondary | ICD-10-CM | POA: Insufficient documentation

## 2014-09-08 DIAGNOSIS — Z8371 Family history of colonic polyps: Secondary | ICD-10-CM | POA: Insufficient documentation

## 2014-09-08 LAB — HM DIABETES EYE EXAM

## 2014-09-08 NOTE — Assessment & Plan Note (Signed)
Sister with colon polyps.  Last colonoscopy 10/2006.  Due f/u colonoscopy with family history.  Will refer to GI.

## 2014-09-08 NOTE — Assessment & Plan Note (Signed)
Discussed diet and exercise 

## 2014-09-08 NOTE — Progress Notes (Signed)
Patient ID: Anne [name], female   DOB: January 07, 1966, 49 y.o.   MRN: [id number]   Subjective:    Patient ID: Anne [name], female    DOB: [birth year], 49 y.o.   MRN: [id number]  HPI  Patient here for a scheduled follow up.  She is doing better regarding the increased stress.  Handling things well.  Tries to stay active.  Is exercising some.  No cardiac sympotms with increased activity or exertion.  Breathing stable.  On protonix and zantac to control the acid.  Sister just recently diagnosed with colon polyps.  Is s/p colon surgery.  We discussed her need for colonoscopy.  Bowels stable.     Past Medical History  Diagnosis Date  . Hypertension   . Diabetes mellitus   . Hypercholesterolemia   . Anemia   . History of abnormal Pap smear     s/p LEEP, h/o HPV  . IBS (irritable bowel syndrome)   . Gastritis     Current Outpatient Prescriptions on File Prior to Visit  Medication Sig Dispense Refill  . fish oil-omega-3 fatty acids 1000 MG capsule Take 1 g by mouth daily.    Marland Kitchen FLUoxetine (PROZAC) 10 MG capsule TAKE 2 CAPSULES BY MOUTH EVERY DAY 180 capsule 0  . fluticasone (FLONASE) 50 MCG/ACT nasal spray Place 2 sprays into the nose daily. 16 g 4  . glucose blood test strip One touch ultra test strip Check blood sugar q day    . losartan (COZAAR) 25 MG tablet TAKE 1 TABLET BY MOUTH DAILY 90 tablet 1  . medroxyPROGESTERone (DEPO-PROVERA) 150 MG/ML injection INJECT 1 ML INTRAMUSCULARLY EVERY THREE MONTHS 1 mL 3  . Multiple Vitamin (MULTIVITAMIN) tablet Take 1 tablet by mouth daily.    . pantoprazole (PROTONIX) 40 MG tablet Take 1 tablet (40 mg total) by mouth daily. 90 tablet 1   No current facility-administered medications on file prior to visit.    Review of Systems  Constitutional: Negative for appetite change and unexpected weight change.  HENT: Negative for congestion and sinus pressure.   Respiratory: Negative for cough, chest tightness and shortness of breath.     Cardiovascular: Negative for chest pain, palpitations and leg swelling.  Gastrointestinal: Negative for nausea, vomiting, abdominal pain and diarrhea.  Genitourinary: Negative for dysuria and difficulty urinating.  Neurological: Negative for dizziness, light-headedness and headaches.  Psychiatric/Behavioral: Negative for dysphoric mood and agitation.       Objective:     Blood pressure recheck:  122/78, pulse 68  Physical Exam  Constitutional: She appears well-developed and well-nourished. No distress.  HENT:  Nose: Nose normal.  Mouth/Throat: Oropharynx is clear and moist.  Neck: Neck supple. No thyromegaly present.  Cardiovascular: Normal rate and regular rhythm.   Pulmonary/Chest: Breath sounds normal. No respiratory distress. She has no wheezes.  Abdominal: Soft. Bowel sounds are normal. There is no tenderness.  Musculoskeletal: She exhibits no edema or tenderness.  Lymphadenopathy:    She has no cervical adenopathy.  Skin: No rash noted. No erythema.  Psychiatric: She has a normal mood and affect. Her behavior is normal.    BP 115/81 mmHg  Pulse 81  Temp(Src) 98 F (36.7 C) (Oral)  Ht 5' 3.5" (1.613 m)  Wt 202 lb 4 oz (91.74 kg)  BMI 35.26 kg/m2  SpO2 98% Wt Readings from Last 3 Encounters:  09/05/14 202 lb 4 oz (91.74 kg)  06/04/14 202 lb 8 oz (91.853 kg)  03/10/14 200 lb 8 oz (90.946 kg)  Lab Results  Component Value Date   WBC 5.5 08/28/2014   HGB 14.0 08/28/2014   HCT 41.3 08/28/2014   PLT 226.0 08/28/2014   GLUCOSE 138* 08/28/2014   CHOL 183 08/28/2014   TRIG 272.0* 08/28/2014   HDL 40.40 08/28/2014   LDLDIRECT 113.0 08/28/2014   LDLCALC 78 12/17/2013   ALT 30 08/28/2014   AST 20 08/28/2014   NA 137 08/28/2014   K 4.3 08/28/2014   CL 105 08/28/2014   CREATININE 0.89 08/28/2014   BUN 15 08/28/2014   CO2 25 08/28/2014   TSH 0.90 08/28/2014   HGBA1C 6.7* 08/28/2014   MICROALBUR 1.7 08/28/2014       Assessment & Plan:   Problem List  Items Addressed This Visit    Anemia    Hgb just checked and wnl.  Follow.        Diabetes mellitus    A1c just checked - 6.7.  Improved.  Continue low carb diet and exercise.  Follow met b and a1c.       Family history of colonic polyps    Sister with colon polyps.  Last colonoscopy 10/2006.  Due f/u colonoscopy with family history.  Will refer to GI.        Relevant Orders   Ambulatory referral to Gastroenterology   GERD (gastroesophageal reflux disease)    With acid reflux.  Requires protonix in the am and zantac in the evening.  Not controlled on protonix alone.  Has been a while since has had EGD.  Being referred to GI for colonoscopy.  Will have them evaluate for possible EGD as well.        Health care maintenance    Last colonoscopy 10/2006.  Sister recently found to have colon polyps as outlined.  Refer to GI for evaluation for colonoscopy.   Mammogram 07/22/14 - Birads I.        HPV test positive    Evaluated by Dr Marcelline Mates 09/17/13.  S/p colposcopy.  Recommended f/u pap in one year.        Hypercholesterolemia    Low cholesterol diet and exercise.  Follow lipid panel.   Lab Results  Component Value Date   CHOL 183 08/28/2014   HDL 40.40 08/28/2014   LDLCALC 78 12/17/2013   LDLDIRECT 113.0 08/28/2014   TRIG 272.0* 08/28/2014   CHOLHDL 5 08/28/2014        Hypertension - Primary    Blood pressure well controlled.  Same medication regimen.  Follow metabolic panel.       Obesity (BMI 30-39.9)    Discussed diet and exercise.        Stress    Doing better.  Handling stress well.  Follow.  On prozac.          I spent 25 minutes with the patient and more than 50% of the time was spent in consultation regarding the above.     Einar Pheasant, MD

## 2014-09-08 NOTE — Assessment & Plan Note (Signed)
Doing better.  Handling stress well.  Follow.  On prozac.

## 2014-09-08 NOTE — Assessment & Plan Note (Signed)
Evaluated by Dr Valentino Saxonherry 09/17/13.  S/p colposcopy.  Recommended f/u pap in one year.

## 2014-09-10 ENCOUNTER — Encounter: Payer: Self-pay | Admitting: Internal Medicine

## 2014-09-10 NOTE — Assessment & Plan Note (Signed)
Hgb just checked and wnl.  Follow.

## 2014-09-10 NOTE — Assessment & Plan Note (Signed)
Last colonoscopy 10/2006.  Sister recently found to have colon polyps as outlined.  Refer to GI for evaluation for colonoscopy.   Mammogram 07/22/14 - Birads I.

## 2014-09-10 NOTE — Assessment & Plan Note (Signed)
A1c just checked - 6.7.  Improved.  Continue low carb diet and exercise.  Follow met b and a1c.

## 2014-09-10 NOTE — Assessment & Plan Note (Signed)
With acid reflux.  Requires protonix in the am and zantac in the evening.  Not controlled on protonix alone.  Has been a while since has had EGD.  Being referred to GI for colonoscopy.  Will have them evaluate for possible EGD as well.

## 2014-09-10 NOTE — Assessment & Plan Note (Signed)
Low cholesterol diet and exercise.  Follow lipid panel.   Lab Results  Component Value Date   CHOL 183 08/28/2014   HDL 40.40 08/28/2014   LDLCALC 78 12/17/2013   LDLDIRECT 113.0 08/28/2014   TRIG 272.0* 08/28/2014   CHOLHDL 5 08/28/2014

## 2014-09-10 NOTE — Assessment & Plan Note (Signed)
Blood pressure well controlled.  Same medication regimen.  Follow metabolic panel.

## 2014-09-12 ENCOUNTER — Encounter: Payer: Self-pay | Admitting: Internal Medicine

## 2014-09-16 ENCOUNTER — Encounter: Payer: Self-pay | Admitting: *Deleted

## 2014-10-23 ENCOUNTER — Encounter: Payer: Self-pay | Admitting: Internal Medicine

## 2014-10-23 NOTE — Telephone Encounter (Signed)
Sent mychart with dates for next injection. Fwd to Dr. Lorin PicketScott per pt request

## 2014-11-05 ENCOUNTER — Other Ambulatory Visit: Payer: Self-pay | Admitting: Internal Medicine

## 2014-11-05 NOTE — Telephone Encounter (Signed)
Lat OV 4.1.16, next OV 6.13.16. Please advise

## 2014-11-05 NOTE — Telephone Encounter (Signed)
rx sent in for prozac 10mg  #180 with on refill.

## 2014-11-13 ENCOUNTER — Other Ambulatory Visit: Payer: Self-pay | Admitting: Internal Medicine

## 2014-11-17 ENCOUNTER — Ambulatory Visit (INDEPENDENT_AMBULATORY_CARE_PROVIDER_SITE_OTHER): Payer: BC Managed Care – PPO | Admitting: *Deleted

## 2014-11-17 DIAGNOSIS — Z3042 Encounter for surveillance of injectable contraceptive: Secondary | ICD-10-CM | POA: Diagnosis not present

## 2014-11-17 MED ORDER — MEDROXYPROGESTERONE ACETATE 150 MG/ML IM SUSP
150.0000 mg | Freq: Once | INTRAMUSCULAR | Status: AC
Start: 2014-11-17 — End: 2014-11-17
  Administered 2014-11-17: 150 mg via INTRAMUSCULAR

## 2014-11-28 ENCOUNTER — Ambulatory Visit: Payer: BC Managed Care – PPO | Admitting: Anesthesiology

## 2014-11-28 ENCOUNTER — Encounter
Admission: RE | Disposition: A | Discharge status: Home Health | Payer: Self-pay | Source: Ambulatory Visit | Attending: Gastroenterology

## 2014-11-28 ENCOUNTER — Ambulatory Visit
Admission: RE | Admit: 2014-11-28 | Discharge: 2014-11-28 | Disposition: A | Discharge status: Home Health | Payer: BC Managed Care – PPO | Source: Ambulatory Visit | Attending: Gastroenterology | Admitting: Gastroenterology

## 2014-11-28 ENCOUNTER — Encounter: Payer: Self-pay | Admitting: Anesthesiology

## 2014-11-28 DIAGNOSIS — K5730 Diverticulosis of large intestine without perforation or abscess without bleeding: Secondary | ICD-10-CM | POA: Insufficient documentation

## 2014-11-28 DIAGNOSIS — E780 Pure hypercholesterolemia: Secondary | ICD-10-CM | POA: Diagnosis not present

## 2014-11-28 DIAGNOSIS — Z79899 Other long term (current) drug therapy: Secondary | ICD-10-CM | POA: Diagnosis not present

## 2014-11-28 DIAGNOSIS — Z1211 Encounter for screening for malignant neoplasm of colon: Secondary | ICD-10-CM | POA: Diagnosis not present

## 2014-11-28 DIAGNOSIS — I10 Essential (primary) hypertension: Secondary | ICD-10-CM | POA: Diagnosis not present

## 2014-11-28 DIAGNOSIS — K219 Gastro-esophageal reflux disease without esophagitis: Secondary | ICD-10-CM | POA: Diagnosis not present

## 2014-11-28 DIAGNOSIS — E119 Type 2 diabetes mellitus without complications: Secondary | ICD-10-CM | POA: Diagnosis not present

## 2014-11-28 DIAGNOSIS — Z8371 Family history of colonic polyps: Secondary | ICD-10-CM | POA: Insufficient documentation

## 2014-11-28 DIAGNOSIS — K589 Irritable bowel syndrome without diarrhea: Secondary | ICD-10-CM | POA: Insufficient documentation

## 2014-11-28 HISTORY — PX: COLONOSCOPY WITH PROPOFOL: SHX5780

## 2014-11-28 SURGERY — COLONOSCOPY WITH PROPOFOL
Anesthesia: General

## 2014-11-28 MED ORDER — PROPOFOL 10 MG/ML IV BOLUS
INTRAVENOUS | Status: DC | PRN
  Administered 2014-11-28: 50 mg via INTRAVENOUS

## 2014-11-28 MED ORDER — PROPOFOL INFUSION 10 MG/ML OPTIME
INTRAVENOUS | Status: DC | PRN
  Administered 2014-11-28: 120 ug/kg/min via INTRAVENOUS

## 2014-11-28 MED ORDER — LIDOCAINE HCL (CARDIAC) 20 MG/ML IV SOLN
INTRAVENOUS | Status: DC | PRN
  Administered 2014-11-28: 60 mg via INTRAVENOUS

## 2014-11-28 MED ORDER — MIDAZOLAM HCL 2 MG/2ML IJ SOLN
INTRAMUSCULAR | Status: DC | PRN
  Administered 2014-11-28: 2 mg via INTRAVENOUS

## 2014-11-28 MED ORDER — SODIUM CHLORIDE 0.9 % IV SOLN
INTRAVENOUS | Status: DC | PRN
  Administered 2014-11-28: 14:00:00 via INTRAVENOUS

## 2014-11-28 MED ORDER — SODIUM CHLORIDE 0.9 % IV SOLN
INTRAVENOUS | Status: DC
  Administered 2014-11-28: 1000 mL via INTRAVENOUS

## 2014-11-28 NOTE — Op Note (Signed)
[facility] Gastroenterology Patient Name: Anne [name] Procedure Date: 11/28/2014 1:53 PM MRN: [id number] Account #: [id number] Date of Birth: September 06, 1965 Admit Type: Outpatient Age: 49 Room: [facility] ENDO ROOM 4 Gender: Female Note Status: Finalized Procedure:         Colonoscopy Indications:       Family history of colonic polyps in a first-degree relative Providers:         Christena Deem, MD Referring MD:      Dale Elgin, MD (Referring MD) Medicines:         Monitored Anesthesia Care Complications:     No immediate complications. Procedure:         Pre-Anesthesia Assessment:                    - ASA Grade Assessment: II - A patient with mild systemic                     disease.                    After obtaining informed consent, the colonoscope was                     passed under direct vision. Throughout the procedure, the                     patient's blood pressure, pulse, and oxygen saturations                     were monitored continuously. The Olympus PCF-160AL                     colonoscope (S#. [id number]) was introduced through the anus                     and advanced to the the cecum, identified by appendiceal                     orifice and ileocecal valve. The colonoscopy was performed                     with moderate difficulty due to a tortuous colon.                     Successful completion of the procedure was aided by                     changing the patient to a supine position, changing the                     patient to a prone position and using manual pressure. The                     patient tolerated the procedure well. The quality of the                     bowel preparation was good. Findings:      A few small-mouthed diverticula were found in the sigmoid colon.      The exam was otherwise without abnormality.      The digital rectal exam was normal. Impression:        - Diverticulosis in the sigmoid colon.     - The examination was otherwise normal.                    -  No specimens collected. Recommendation:    - Repeat colonoscopy in 5 years for screening purposes. Procedure Code(s): --- Professional ---                    [id number], Colonoscopy, flexible; diagnostic, including                     collection of specimen(s) by brushing or washing, when                     performed (separate procedure) Diagnosis Code(s): --- Professional ---                    V18.51, Family history of colonic polyps                    562.10, Diverticulosis of colon (without mention of                     hemorrhage) CPT copyright 2014 [employer]. All rights reserved. The codes documented in this report are preliminary and upon coder review may  be revised to meet current compliance requirements. Christena Deem, MD 11/28/2014 2:49:05 PM This report has been signed electronically. Number of Addenda: 0 Note Initiated On: 11/28/2014 1:53 PM Scope Withdrawal Time: 0 hours 6 minutes 55 seconds  Total Procedure Duration: 0 hours 22 minutes 25 seconds       [facility]

## 2014-11-28 NOTE — Anesthesia Preprocedure Evaluation (Signed)
Anesthesia Evaluation  Patient identified by MRN, date of birth, ID band Patient awake    Reviewed: Allergy & Precautions, NPO status , Patient's Chart, lab work & pertinent test results  Airway Mallampati: II       Dental no notable dental hx.    Pulmonary neg pulmonary ROS,  breath sounds clear to auscultation  Pulmonary exam normal       Cardiovascular hypertension, Normal cardiovascular exam    Neuro/Psych negative neurological ROS  negative psych ROS   GI/Hepatic Neg liver ROS, GERD-  Medicated and Controlled,IBS   Endo/Other  diabetes, Well Controlled  Renal/GU negative Renal ROS  negative genitourinary   Musculoskeletal negative musculoskeletal ROS (+)   Abdominal Normal abdominal exam  (+)   Peds negative pediatric ROS (+)  Hematology negative hematology ROS (+) anemia ,   Anesthesia Other Findings   Reproductive/Obstetrics                             Anesthesia Physical Anesthesia Plan  ASA: II  Anesthesia Plan: General   Post-op Pain Management:    Induction: Intravenous  Airway Management Planned: Nasal Cannula  Additional Equipment:   Intra-op Plan:   Post-operative Plan:   Informed Consent: I have reviewed the patients History and Physical, chart, labs and discussed the procedure including the risks, benefits and alternatives for the proposed anesthesia with the patient or authorized representative who has indicated his/her understanding and acceptance.   Dental advisory given  Plan Discussed with: CRNA and Surgeon  Anesthesia Plan Comments:         Anesthesia Quick Evaluation

## 2014-11-28 NOTE — Transfer of Care (Signed)
Immediate Anesthesia Transfer of Care Note  Patient: Anne [name]  Procedure(s) Performed: Procedure(s): COLONOSCOPY WITH PROPOFOL (N/A)  Patient Location: Endoscopy Unit  Anesthesia Type:General  Level of Consciousness: awake, alert , oriented and patient cooperative  Airway & Oxygen Therapy: Patient Spontanous Breathing and Patient connected to nasal cannula oxygen  Post-op Assessment: Report given to RN, Post -op Vital signs reviewed and stable and Patient moving all extremities X 4  Post vital signs: Reviewed and stable  Last Vitals:  Filed Vitals:   11/28/14 1512  BP: 119/68  Pulse: 76  Temp: 36.1 C  Resp: 13    Complications: No apparent anesthesia complications

## 2014-11-28 NOTE — H&P (Addendum)
Outpatient short stay form Pre-procedure 11/28/2014 1:58 PM Anne Deem MD  Primary Physician: Dr. Dale Webster  Reason for visit:  Colonoscopy  History of present illness:  Patient is a 49 year old female presenting for colonoscopy. He has a family history of colon polyps in her sister. The patient's last colonoscopy was in 2008 negative for polyps at that time.  She also asked whether she needed an upper scope today. She does take a proton pump inhibitor. This controls her reflux and heartburn type symptoms very well unless she misses doses. Denies any dysphagia or nausea or vomiting. I told her that we would only need to do an upper scope if the medication did not work for her or she had new or different symptoms. I did review her previous biopsies and she was negative for H. pylori and negative for Barrett's esophagus.    Current facility-administered medications:  .  0.9 %  sodium chloride infusion, , Intravenous, Continuous, Anne Deem, MD, Last Rate: 20 mL/hr at 11/28/14 1333, 1,000 mL at 11/28/14 1333  Prescriptions prior to admission  Medication Sig Dispense Refill Last Dose  . fish oil-omega-3 fatty acids 1000 MG capsule Take 1 g by mouth daily.   Past Week at Unknown time  . FLUoxetine (PROZAC) 10 MG capsule TAKE 2 CAPSULES BY MOUTH EVERY DAY 180 capsule 1 11/27/2014 at 1030pm  . losartan (COZAAR) 25 MG tablet TAKE 1 TABLET BY MOUTH DAILY 90 tablet 1 11/28/2014 at 800am  . medroxyPROGESTERone (DEPO-PROVERA) 150 MG/ML injection INJECT 1 ML INTRAMUSCULARLY EVERY THREE MONTHS 1 mL 3 Past Month at Unknown time  . Multiple Vitamin (MULTIVITAMIN) tablet Take 1 tablet by mouth daily.   Past Week at Unknown time  . pantoprazole (PROTONIX) 40 MG tablet TAKE 1 TABLET (40 MG TOTAL) BY MOUTH DAILY. 90 tablet 1 Past Week at Unknown time  . polyethylene glycol powder (GLYCOLAX/MIRALAX) powder TAKE 255 G BY MOUTH ONCE. TAKE AS DIRECTED FOR COLON PREP.  0 11/27/2014 at 600pm  .  ranitidine (ZANTAC) 150 MG capsule Take 150 mg by mouth every evening.   11/28/2014 at 800am     No Known Allergies   Past Medical History  Diagnosis Date  . Hypertension   . Diabetes mellitus   . Hypercholesterolemia   . Anemia   . History of abnormal Pap smear     s/p LEEP, h/o HPV  . IBS (irritable bowel syndrome)   . Gastritis     Review of systems:      Physical Exam    Heart and lungs: Regular rate and rhythm without rub or gallop lungs are bilaterally clear    HEENT: Normocephalic atraumatic eyes are anicteric    Other:     Pertinant exam for procedure: Soft nontender nondistended bowel sounds positive normoactive    Planned proceedures: Colonoscopy and indicated procedures I have discussed the risks benefits and complications of procedures to include not limited to bleeding, infection, perforation and the risk of sedation and the patient wishes to proceed.Anne Deem, MD Gastroenterology 11/28/2014  1:58 PM     Patient states she takes no aspirin products or thinners. He tolerated her prep well.

## 2014-11-28 NOTE — Anesthesia Postprocedure Evaluation (Signed)
  Anesthesia Post-op Note  Patient: Anne [name]  Procedure(s) Performed: Procedure(s): COLONOSCOPY WITH PROPOFOL (N/A)  Anesthesia type:General  Patient location: PACU  Post pain: Pain level controlled  Post assessment: Post-op Vital signs reviewed, Patient's Cardiovascular Status Stable, Respiratory Function Stable, Patent Airway and No signs of Nausea or vomiting  Post vital signs: Reviewed and stable  Last Vitals:  Filed Vitals:   11/28/14 1540  BP: 112/80  Pulse: 79  Temp:   Resp: 14    Level of consciousness: awake, alert  and patient cooperative  Complications: No apparent anesthesia complications

## 2014-12-01 ENCOUNTER — Encounter: Payer: Self-pay | Admitting: Gastroenterology

## 2014-12-05 ENCOUNTER — Encounter: Payer: Self-pay | Admitting: Internal Medicine

## 2014-12-12 ENCOUNTER — Telehealth: Payer: Self-pay | Admitting: Internal Medicine

## 2014-12-12 ENCOUNTER — Ambulatory Visit (INDEPENDENT_AMBULATORY_CARE_PROVIDER_SITE_OTHER): Payer: BC Managed Care – PPO | Admitting: Nurse Practitioner

## 2014-12-12 VITALS — BP 124/78 | HR 79 | Temp 98.7°F | Resp 14 | Ht 63.5 in | Wt 197.0 lb

## 2014-12-12 DIAGNOSIS — J069 Acute upper respiratory infection, unspecified: Secondary | ICD-10-CM | POA: Diagnosis not present

## 2014-12-12 MED ORDER — AMOXICILLIN 500 MG PO CAPS: 500.0000 mg | ORAL_CAPSULE | Freq: Two times a day (BID) | ORAL | Status: DC

## 2014-12-12 MED ORDER — HYDROCOD POLST-CPM POLST ER 10-8 MG/5ML PO SUER: 5.0000 mL | Freq: Every evening | ORAL | Status: DC | PRN

## 2014-12-12 NOTE — Telephone Encounter (Signed)
FYI. Colonoscopy in Epic.

## 2014-12-12 NOTE — Patient Instructions (Addendum)
Please take a probiotic ( Align, Floraque or Culturelle) while you are on the antibiotic to prevent a serious antibiotic associated diarrhea  Called clostirudium dificile colitis and a vaginal yeast infection.   Call us if not improving.   1 tsp (5 mL) at night time, do not drive or operate heavy machinery.

## 2014-12-12 NOTE — Progress Notes (Signed)
   Subjective:    Patient ID: Anne [name], female    DOB: 12-16-1965, 49 y.o.   MRN: [id number]  HPI  [name] is a 49 yo female with a CC of URI symptoms x 16 days.   1) Cough, chest congestion, productive green going to clear, headache, rattiling in chest, no engery x 16 days  Throat spray  Mucinex- helpful  Cold/flu OTC medication- helpful  Sinus max- helpful   Review of Systems  HENT: Positive for congestion, rhinorrhea, sore throat and voice change. Negative for ear discharge, ear pain, postnasal drip, sinus pressure and sneezing.   Eyes: Positive for discharge. Negative for visual disturbance.       Watery  Respiratory: Positive for cough. Negative for chest tightness, shortness of breath and wheezing.   Cardiovascular: Negative for chest pain, palpitations and leg swelling.  Gastrointestinal: Negative for nausea, vomiting and diarrhea.  Skin: Negative for rash.  Neurological: Positive for headaches. Negative for dizziness and light-headedness.      Objective:   Physical Exam  Constitutional: She is oriented to person, place, and time. She appears well-developed and well-nourished. No distress.  BP 124/78 mmHg  Pulse 79  Temp(Src) 98.7 F (37.1 C)  Resp 14  Ht 5' 3.5" (1.613 m)  Wt 197 lb (89.359 kg)  BMI 34.35 kg/m2  SpO2 97%   HENT:  Head: Normocephalic and atraumatic.  Right Ear: External ear normal.  Left Ear: External ear normal.  Cardiovascular: Normal rate, regular rhythm, normal heart sounds and intact distal pulses.  Exam reveals no gallop and no friction rub.   No murmur heard. Pulmonary/Chest: Effort normal and breath sounds normal. No respiratory distress. She has no wheezes. She has no rales. She exhibits no tenderness.  Neurological: She is alert and oriented to person, place, and time. No cranial nerve deficit. She exhibits normal muscle tone. Coordination normal.  Skin: Skin is warm and dry. No rash noted. She is not diaphoretic.    Psychiatric: She has a normal mood and affect. Her behavior is normal. Judgment and thought content normal.      Assessment & Plan:  Acute URI  1) Will start Amoxil 500 mg bid due to length of time with symptoms 2) Encouraged probiotics 3) Tussionex given to pt with instructions on how much to take and when to take it. Counseled on drowsiness 4)  FU prn worsening/failure to improve.

## 2014-12-12 NOTE — Telephone Encounter (Signed)
FYI: Pt wanted Dr. Lorin PicketScott to know that she has already had her colonoscopy. Dr. Marva PandaSkulskie has ordered a sleep study for the pt that is scheduled on 12/26/14.msn

## 2014-12-12 NOTE — Progress Notes (Signed)
Pre visit review using our clinic review tool, if applicable. No additional management support is needed unless otherwise documented below in the visit note. 

## 2014-12-25 ENCOUNTER — Encounter: Payer: Self-pay | Admitting: Nurse Practitioner

## 2014-12-26 ENCOUNTER — Ambulatory Visit: Payer: BC Managed Care – PPO | Attending: Otolaryngology

## 2014-12-26 DIAGNOSIS — R0683 Snoring: Secondary | ICD-10-CM | POA: Insufficient documentation

## 2014-12-26 DIAGNOSIS — G4733 Obstructive sleep apnea (adult) (pediatric): Secondary | ICD-10-CM | POA: Insufficient documentation

## 2014-12-30 ENCOUNTER — Other Ambulatory Visit: Payer: BC Managed Care – PPO

## 2015-01-02 ENCOUNTER — Ambulatory Visit: Payer: BC Managed Care – PPO | Admitting: Internal Medicine

## 2015-01-06 ENCOUNTER — Telehealth: Payer: Self-pay | Admitting: *Deleted

## 2015-01-06 NOTE — Telephone Encounter (Signed)
Left detailed message on VM requesting pt to call Dr Reyes Ivan office for results.

## 2015-01-06 NOTE — Telephone Encounter (Signed)
It appears that Dr Marva Panda ordered the test.  Please have her call his office for results.  If we need to do something, they should let us know.

## 2015-01-06 NOTE — Telephone Encounter (Signed)
Pt called requesting Sleep Study results from 7.22.16.  Please advise

## 2015-01-07 ENCOUNTER — Encounter: Payer: Self-pay | Admitting: Internal Medicine

## 2015-01-07 DIAGNOSIS — G4733 Obstructive sleep apnea (adult) (pediatric): Secondary | ICD-10-CM

## 2015-01-07 MED ORDER — ZOLPIDEM TARTRATE 5 MG PO TABS: ORAL_TABLET | ORAL | Status: DC

## 2015-01-07 NOTE — Telephone Encounter (Signed)
Order placed for sleep study with CPAP titration.  I have also written rx for the ambien to take with her to sleep lab.

## 2015-01-16 ENCOUNTER — Other Ambulatory Visit: Payer: BC Managed Care – PPO

## 2015-01-22 ENCOUNTER — Ambulatory Visit: Payer: BC Managed Care – PPO | Attending: Otolaryngology

## 2015-01-22 DIAGNOSIS — G478 Other sleep disorders: Secondary | ICD-10-CM | POA: Diagnosis present

## 2015-01-22 DIAGNOSIS — G4733 Obstructive sleep apnea (adult) (pediatric): Secondary | ICD-10-CM | POA: Diagnosis not present

## 2015-01-22 DIAGNOSIS — R0683 Snoring: Secondary | ICD-10-CM | POA: Insufficient documentation

## 2015-01-23 ENCOUNTER — Other Ambulatory Visit (INDEPENDENT_AMBULATORY_CARE_PROVIDER_SITE_OTHER): Payer: BC Managed Care – PPO

## 2015-01-23 DIAGNOSIS — E119 Type 2 diabetes mellitus without complications: Secondary | ICD-10-CM | POA: Diagnosis not present

## 2015-01-23 LAB — LIPID PANEL
CHOL/HDL RATIO: 6
CHOLESTEROL: 208 mg/dL — AB (ref 0–200)
HDL: 36.4 mg/dL — AB (ref >39.00)
LDL CALC: 135 mg/dL — AB (ref 0–99)
NonHDL: 171.64
TRIGLYCERIDES: 182 mg/dL — AB (ref 0.0–149.0)
VLDL: 36.4 mg/dL (ref 0.0–40.0)

## 2015-01-23 LAB — COMPREHENSIVE METABOLIC PANEL
ALBUMIN: 4.1 g/dL (ref 3.5–5.2)
ALT: 35 U/L (ref 0–35)
AST: 41 U/L — ABNORMAL HIGH (ref 0–37)
Alkaline Phosphatase: 73 U/L (ref 39–117)
BUN: 11 mg/dL (ref 6–23)
CALCIUM: 9.2 mg/dL (ref 8.4–10.5)
CO2: 25 mEq/L (ref 19–32)
CREATININE: 0.9 mg/dL (ref 0.40–1.20)
Chloride: 106 mEq/L (ref 96–112)
GFR: 71.31 mL/min (ref >60.00)
Glucose, Bld: 103 mg/dL — ABNORMAL HIGH (ref 70–99)
Potassium: 3.9 mEq/L (ref 3.5–5.1)
Sodium: 140 mEq/L (ref 135–145)
Total Bilirubin: 0.5 mg/dL (ref 0.2–1.2)
Total Protein: 6.9 g/dL (ref 6.0–8.3)

## 2015-01-23 LAB — HEMOGLOBIN A1C: Hgb A1c MFr Bld: 6.9 % — ABNORMAL HIGH (ref 4.6–6.5)

## 2015-01-23 NOTE — Addendum Note (Signed)
Addended by: Cristela Blue on: 01/23/2015 10:00 AM   Modules accepted: Orders

## 2015-01-25 ENCOUNTER — Encounter: Payer: Self-pay | Admitting: Internal Medicine

## 2015-01-27 ENCOUNTER — Encounter: Payer: Self-pay | Admitting: Internal Medicine

## 2015-01-27 ENCOUNTER — Ambulatory Visit (INDEPENDENT_AMBULATORY_CARE_PROVIDER_SITE_OTHER): Payer: BC Managed Care – PPO | Admitting: Internal Medicine

## 2015-01-27 VITALS — BP 120/70 | HR 85 | Temp 99.0°F | Ht 63.5 in | Wt 200.5 lb

## 2015-01-27 DIAGNOSIS — E119 Type 2 diabetes mellitus without complications: Secondary | ICD-10-CM

## 2015-01-27 DIAGNOSIS — R945 Abnormal results of liver function studies: Secondary | ICD-10-CM

## 2015-01-27 DIAGNOSIS — R888 Abnormal findings in other body fluids and substances: Secondary | ICD-10-CM

## 2015-01-27 DIAGNOSIS — Z23 Encounter for immunization: Secondary | ICD-10-CM | POA: Diagnosis not present

## 2015-01-27 DIAGNOSIS — K219 Gastro-esophageal reflux disease without esophagitis: Secondary | ICD-10-CM

## 2015-01-27 DIAGNOSIS — I10 Essential (primary) hypertension: Secondary | ICD-10-CM

## 2015-01-27 DIAGNOSIS — IMO0002 Reserved for concepts with insufficient information to code with codable children: Secondary | ICD-10-CM

## 2015-01-27 DIAGNOSIS — E7800 Pure hypercholesterolemia, unspecified: Secondary | ICD-10-CM

## 2015-01-27 DIAGNOSIS — Z8371 Family history of colonic polyps: Secondary | ICD-10-CM

## 2015-01-27 DIAGNOSIS — E669 Obesity, unspecified: Secondary | ICD-10-CM

## 2015-01-27 DIAGNOSIS — Z658 Other specified problems related to psychosocial circumstances: Secondary | ICD-10-CM

## 2015-01-27 DIAGNOSIS — F439 Reaction to severe stress, unspecified: Secondary | ICD-10-CM

## 2015-01-27 DIAGNOSIS — G4733 Obstructive sleep apnea (adult) (pediatric): Secondary | ICD-10-CM

## 2015-01-27 DIAGNOSIS — B977 Papillomavirus as the cause of diseases classified elsewhere: Secondary | ICD-10-CM

## 2015-01-27 DIAGNOSIS — D649 Anemia, unspecified: Secondary | ICD-10-CM | POA: Diagnosis not present

## 2015-01-27 DIAGNOSIS — R7989 Other specified abnormal findings of blood chemistry: Secondary | ICD-10-CM

## 2015-01-27 MED ORDER — ZOLPIDEM TARTRATE 5 MG PO TABS: 5.0000 mg | ORAL_TABLET | Freq: Every evening | ORAL | Status: DC | PRN

## 2015-01-27 NOTE — Progress Notes (Signed)
Pre-visit discussion using our clinic review tool. No additional management support is needed unless otherwise documented below in the visit note.  

## 2015-01-27 NOTE — Progress Notes (Signed)
Patient ID: Anne [name], female   DOB: 1965-08-04, 49 y.o.   MRN: [id number]   Subjective:    Patient ID: Anne [name], female    DOB: December 04, 1965, 49 y.o.   MRN: [id number]  HPI  Patient here for a scheduled follow up.  Discussed diet and exercise.  A1c 6.9 on recent labs.  AST slightly elevated.  Discussed weight loss.  Triglycerides improved.  LDL slightly increased from the last check.  Had sleep study.  Has sleep apnea.  Will plan for CPAP.  No cardiac symptoms with increased activity or exertion.  No sob.  No acid reflux.  Bowels stable.  Handling stress.     Past Medical History  Diagnosis Date  . Hypertension   . Diabetes mellitus   . Hypercholesterolemia   . Anemia   . History of abnormal Pap smear     s/p LEEP, h/o HPV  . IBS (irritable bowel syndrome)   . Gastritis    Past Surgical History  Procedure Laterality Date  . Appendectomy    . Colonoscopy with propofol N/A 11/28/2014    Procedure: COLONOSCOPY WITH PROPOFOL;  Surgeon: Lollie Sails, MD;  [facility];  Service: Endoscopy;  Laterality: N/A;   Family History  Problem Relation Age of Onset  . Diabetes Paternal Grandmother   . Emphysema Paternal Grandfather   . Breast cancer Neg Hx   . Colon cancer Neg Hx    Social History   Social History  . Marital Status: Married    Spouse Name: N/A  . Number of Children: 0  . Years of Education: N/A   Social History Main Topics  . Smoking status: Never Smoker   . Smokeless tobacco: Never Used  . Alcohol Use: No  . Drug Use: No  . Sexual Activity: Not Asked   Other Topics Concern  . None   Social History Narrative    Outpatient Encounter Prescriptions as of 01/27/2015  Medication Sig  . fish oil-omega-3 fatty acids 1000 MG capsule Take 1 g by mouth daily.  Marland Kitchen FLUoxetine (PROZAC) 10 MG capsule TAKE 2 CAPSULES BY MOUTH EVERY DAY  . losartan (COZAAR) 25 MG tablet TAKE 1 TABLET BY MOUTH DAILY  . medroxyPROGESTERone (DEPO-PROVERA)  150 MG/ML injection INJECT 1 ML INTRAMUSCULARLY EVERY THREE MONTHS  . Multiple Vitamin (MULTIVITAMIN) tablet Take 1 tablet by mouth daily.  . pantoprazole (PROTONIX) 40 MG tablet TAKE 1 TABLET (40 MG TOTAL) BY MOUTH DAILY.  . ranitidine (ZANTAC) 150 MG capsule Take 150 mg by mouth every evening.  . zolpidem (AMBIEN) 5 MG tablet Take 1 tablet (5 mg total) by mouth at bedtime as needed for sleep.  . [DISCONTINUED] amoxicillin (AMOXIL) 500 MG capsule Take 1 capsule (500 mg total) by mouth 2 (two) times daily.  . [DISCONTINUED] chlorpheniramine-HYDROcodone (TUSSIONEX PENNKINETIC ER) 10-8 MG/5ML SUER Take 5 mLs by mouth at bedtime as needed for cough.  . [DISCONTINUED] zolpidem (AMBIEN) 5 MG tablet Take the tablets with her to the sleep lab.  They will let her know when to take.   No facility-administered encounter medications on file as of 01/27/2015.    Review of Systems  Constitutional: Negative for appetite change and unexpected weight change.  HENT: Negative for congestion and sinus pressure.   Eyes: Negative for discharge and visual disturbance.  Respiratory: Negative for cough, chest tightness and shortness of breath.   Cardiovascular: Negative for chest pain, palpitations and leg swelling.  Gastrointestinal: Negative for nausea, vomiting, abdominal pain and  diarrhea.  Genitourinary: Negative for dysuria and difficulty urinating.  Musculoskeletal: Negative for back pain and joint swelling.  Skin: Negative for color change and rash.  Neurological: Negative for dizziness, light-headedness and headaches.  Hematological: Negative for adenopathy. Does not bruise/bleed easily.  Psychiatric/Behavioral: Negative for dysphoric mood and agitation.       Objective:     Blood pressure rechecked by me:  122/78  Physical Exam  Constitutional: She appears well-developed and well-nourished. No distress.  HENT:  Nose: Nose normal.  Mouth/Throat: Oropharynx is clear and moist.  Eyes: Conjunctivae  are normal. Right eye exhibits no discharge. Left eye exhibits no discharge.  Neck: Neck supple. No thyromegaly present.  Cardiovascular: Normal rate and regular rhythm.   Pulmonary/Chest: Breath sounds normal. No respiratory distress. She has no wheezes.  Abdominal: Soft. Bowel sounds are normal. There is no tenderness.  Musculoskeletal: She exhibits no edema or tenderness.  Lymphadenopathy:    She has no cervical adenopathy.  Skin: No rash noted. No erythema.  Psychiatric: She has a normal mood and affect. Her behavior is normal.    BP 120/70 mmHg  Pulse 85  Temp(Src) 99 F (37.2 C) (Oral)  Ht 5' 3.5" (1.613 m)  Wt 200 lb 8 oz (90.946 kg)  BMI 34.96 kg/m2  SpO2 97% Wt Readings from Last 3 Encounters:  01/27/15 200 lb 8 oz (90.946 kg)  12/12/14 197 lb (89.359 kg)  11/28/14 196 lb (88.905 kg)     Lab Results  Component Value Date   WBC 5.5 08/28/2014   HGB 14.0 08/28/2014   HCT 41.3 08/28/2014   PLT 226.0 08/28/2014   GLUCOSE 103* 01/23/2015   CHOL 208* 01/23/2015   TRIG 182.0* 01/23/2015   HDL 36.40* 01/23/2015   LDLDIRECT 113.0 08/28/2014   LDLCALC 135* 01/23/2015   ALT 35 01/23/2015   AST 41* 01/23/2015   NA 140 01/23/2015   K 3.9 01/23/2015   CL 106 01/23/2015   CREATININE 0.90 01/23/2015   BUN 11 01/23/2015   CO2 25 01/23/2015   TSH 0.90 08/28/2014   HGBA1C 6.9* 01/23/2015   MICROALBUR 1.7 08/28/2014      Assessment & Plan:   Problem List Items Addressed This Visit    Anemia    Just had f/u colonoscopy 11/28/14 - diverticulosis.  Recommended f/u colonoscopy in five years.  Follow cbc.  Recent check wnl.        Diabetes mellitus    a1c just checked - 6.9.  Discussed diet and exercise.  Low carb diet and exercise.  Follow met b and a1c.        Family history of colonic polyps    Colonoscopy 11/28/14 - as outlined.  Recommended f/u colonoscopy in five years.        GERD (gastroesophageal reflux disease)    Acid reflux controlled.  On protonix and  zantac.  Follow.        HPV test positive    Seeing Dr Marcelline Mates.  Is s/p colposcopy.        Hypercholesterolemia    Low cholesterol diet and exercise.  Follow lipid panel.  Recent improvement in triglycerides.  LDL increased.  Follow.       Hypertension    Blood pressure under good control.  Continue same medication regimen.  Follow pressures.  Follow metabolic panel.        Obesity (BMI 30-39.9)    Diet and exercise.  Follow.        Obstructive sleep apnea  CPAP.       Stress    On prozac.  Doing better.  Follow.         Other Visit Diagnoses    Encounter for immunization    -  Primary    Abnormal liver function tests        Relevant Orders    Hepatic function panel        Einar Pheasant, MD

## 2015-01-28 NOTE — Telephone Encounter (Signed)
Unread mychart message mailed to patient 

## 2015-02-01 ENCOUNTER — Encounter: Payer: Self-pay | Admitting: Internal Medicine

## 2015-02-01 DIAGNOSIS — G4733 Obstructive sleep apnea (adult) (pediatric): Secondary | ICD-10-CM | POA: Insufficient documentation

## 2015-02-01 NOTE — Assessment & Plan Note (Signed)
Acid reflux controlled.  On protonix and zantac.  Follow.

## 2015-02-01 NOTE — Assessment & Plan Note (Signed)
Just had f/u colonoscopy 11/28/14 - diverticulosis.  Recommended f/u colonoscopy in five years.  Follow cbc.  Recent check wnl.

## 2015-02-01 NOTE — Assessment & Plan Note (Signed)
Low cholesterol diet and exercise.  Follow lipid panel.  Recent improvement in triglycerides.  LDL increased.  Follow.

## 2015-02-01 NOTE — Assessment & Plan Note (Signed)
On prozac.  Doing better.  Follow.   

## 2015-02-01 NOTE — Assessment & Plan Note (Signed)
Blood pressure under good control.  Continue same medication regimen.  Follow pressures.  Follow metabolic panel.   

## 2015-02-01 NOTE — Assessment & Plan Note (Signed)
Seeing Dr Valentino Saxon.  Is s/p colposcopy.

## 2015-02-01 NOTE — Assessment & Plan Note (Signed)
CPAP.  

## 2015-02-01 NOTE — Assessment & Plan Note (Signed)
a1c just checked - 6.9.  Discussed diet and exercise.  Low carb diet and exercise.  Follow met b and a1c.

## 2015-02-01 NOTE — Assessment & Plan Note (Signed)
Colonoscopy 11/28/14 - as outlined.  Recommended f/u colonoscopy in five years.

## 2015-02-01 NOTE — Assessment & Plan Note (Signed)
Diet and exercise.  Follow.  

## 2015-02-04 ENCOUNTER — Telehealth: Payer: Self-pay | Admitting: *Deleted

## 2015-02-04 NOTE — Telephone Encounter (Signed)
Pt called requesting status of CPap machine and supplies.  Please advise

## 2015-02-04 NOTE — Telephone Encounter (Signed)
I have not received an order sheet to sign.  I wrote a rx for CPAP.

## 2015-02-04 NOTE — Telephone Encounter (Signed)
Faxed order to [facility] & left pt a voicemail to notify her as well.

## 2015-02-10 ENCOUNTER — Ambulatory Visit (INDEPENDENT_AMBULATORY_CARE_PROVIDER_SITE_OTHER): Payer: BC Managed Care – PPO

## 2015-02-10 ENCOUNTER — Other Ambulatory Visit: Payer: BC Managed Care – PPO

## 2015-02-10 ENCOUNTER — Ambulatory Visit: Payer: BC Managed Care – PPO

## 2015-02-10 DIAGNOSIS — Z3042 Encounter for surveillance of injectable contraceptive: Secondary | ICD-10-CM | POA: Diagnosis not present

## 2015-02-10 DIAGNOSIS — R7989 Other specified abnormal findings of blood chemistry: Secondary | ICD-10-CM | POA: Diagnosis not present

## 2015-02-10 DIAGNOSIS — R945 Abnormal results of liver function studies: Secondary | ICD-10-CM

## 2015-02-10 LAB — HEPATIC FUNCTION PANEL
ALT: 22 U/L (ref 0–35)
AST: 16 U/L (ref 0–37)
Albumin: 4.1 g/dL (ref 3.5–5.2)
Alkaline Phosphatase: 73 U/L (ref 39–117)
BILIRUBIN DIRECT: 0.1 mg/dL (ref 0.0–0.3)
BILIRUBIN TOTAL: 0.5 mg/dL (ref 0.2–1.2)
Total Protein: 6.9 g/dL (ref 6.0–8.3)

## 2015-02-10 MED ORDER — MEDROXYPROGESTERONE ACETATE 150 MG/ML IM SUSP
150.0000 mg | Freq: Once | INTRAMUSCULAR | Status: AC
  Administered 2015-02-10: 150 mg via INTRAMUSCULAR

## 2015-02-10 NOTE — Progress Notes (Addendum)
Pt is getting a depo shot in her left deltoid. She tolerated well.

## 2015-02-11 ENCOUNTER — Encounter: Payer: Self-pay | Admitting: Internal Medicine

## 2015-02-19 ENCOUNTER — Telehealth: Payer: Self-pay | Admitting: *Deleted

## 2015-02-19 DIAGNOSIS — D649 Anemia, unspecified: Secondary | ICD-10-CM

## 2015-02-19 DIAGNOSIS — E7800 Pure hypercholesterolemia, unspecified: Secondary | ICD-10-CM

## 2015-02-19 DIAGNOSIS — E119 Type 2 diabetes mellitus without complications: Secondary | ICD-10-CM

## 2015-02-19 DIAGNOSIS — I10 Essential (primary) hypertension: Secondary | ICD-10-CM

## 2015-02-19 NOTE — Telephone Encounter (Signed)
Patient requested a call back about her Rx that she had filled.Patient stated that she did not receive her whole Rx. -Thanks

## 2015-02-19 NOTE — Telephone Encounter (Signed)
Spoke with pt

## 2015-02-19 NOTE — Telephone Encounter (Signed)
Patient requested to know if she should have fasting labs before her Dec 5th appt.

## 2015-02-20 NOTE — Telephone Encounter (Signed)
Yes.  Please schedule labs 1-2 days before her December 5th appt.  Lab orders have been placed.

## 2015-02-23 ENCOUNTER — Other Ambulatory Visit: Payer: Self-pay

## 2015-02-23 ENCOUNTER — Telehealth: Payer: Self-pay | Admitting: *Deleted

## 2015-02-23 MED ORDER — PANTOPRAZOLE SODIUM 40 MG PO TBEC: 40.0000 mg | DELAYED_RELEASE_TABLET | Freq: Every day | ORAL | Status: DC

## 2015-02-23 NOTE — Telephone Encounter (Signed)
Medication has been refilled.

## 2015-02-23 NOTE — Telephone Encounter (Signed)
Received approval for Pantoprazole Effective: 01/21/15-02/20/16. Faxed copy to [facility]

## 2015-03-03 ENCOUNTER — Telehealth: Payer: Self-pay | Admitting: *Deleted

## 2015-03-03 NOTE — Telephone Encounter (Signed)
PA initiated for Zolpidem Tartrate  #30 via Cover my meds.

## 2015-03-09 ENCOUNTER — Encounter: Payer: Self-pay | Admitting: Internal Medicine

## 2015-03-11 ENCOUNTER — Encounter: Payer: Self-pay | Admitting: *Deleted

## 2015-03-12 ENCOUNTER — Encounter: Payer: Self-pay | Admitting: Internal Medicine

## 2015-03-16 ENCOUNTER — Other Ambulatory Visit: Payer: Self-pay | Admitting: Internal Medicine

## 2015-03-20 ENCOUNTER — Other Ambulatory Visit: Payer: Self-pay

## 2015-03-20 ENCOUNTER — Telehealth: Payer: Self-pay | Admitting: Internal Medicine

## 2015-03-20 MED ORDER — ZOLPIDEM TARTRATE 5 MG PO TABS: 5.0000 mg | ORAL_TABLET | Freq: Every evening | ORAL | Status: DC | PRN

## 2015-03-20 NOTE — Telephone Encounter (Signed)
Please advise refill, PA was completed last month.

## 2015-03-20 NOTE — Telephone Encounter (Signed)
Pt called about a question regarding no refill on zolpidem (AMBIEN) 5 MG tablet ? [facility] [place]. Thank You!

## 2015-03-20 NOTE — Telephone Encounter (Signed)
This medication was given to her to take for her sleep study.  Per our records, this is not a regular medication she takes.

## 2015-03-20 NOTE — Telephone Encounter (Signed)
Sent message to Dr. Lorin PicketScott to approve.

## 2015-03-20 NOTE — Telephone Encounter (Signed)
ok'd refill ambien #30 with no refills.  rx signed and placed on your desk.

## 2015-03-20 NOTE — Telephone Encounter (Signed)
Please advise if this is okay to fill? After our discussion on it?

## 2015-03-30 ENCOUNTER — Encounter: Payer: Self-pay | Admitting: Internal Medicine

## 2015-04-03 ENCOUNTER — Encounter: Payer: Self-pay | Admitting: Internal Medicine

## 2015-04-03 ENCOUNTER — Ambulatory Visit (INDEPENDENT_AMBULATORY_CARE_PROVIDER_SITE_OTHER): Payer: BC Managed Care – PPO | Admitting: Internal Medicine

## 2015-04-03 VITALS — BP 128/90 | HR 75 | Temp 98.7°F | Resp 18 | Ht 63.5 in | Wt 200.0 lb

## 2015-04-03 DIAGNOSIS — R300 Dysuria: Secondary | ICD-10-CM

## 2015-04-03 DIAGNOSIS — L298 Other pruritus: Secondary | ICD-10-CM | POA: Diagnosis not present

## 2015-04-03 DIAGNOSIS — I10 Essential (primary) hypertension: Secondary | ICD-10-CM

## 2015-04-03 DIAGNOSIS — N898 Other specified noninflammatory disorders of vagina: Secondary | ICD-10-CM

## 2015-04-03 DIAGNOSIS — Z8371 Family history of colonic polyps: Secondary | ICD-10-CM

## 2015-04-03 DIAGNOSIS — G4733 Obstructive sleep apnea (adult) (pediatric): Secondary | ICD-10-CM

## 2015-04-03 DIAGNOSIS — K219 Gastro-esophageal reflux disease without esophagitis: Secondary | ICD-10-CM

## 2015-04-03 DIAGNOSIS — D649 Anemia, unspecified: Secondary | ICD-10-CM

## 2015-04-03 DIAGNOSIS — E119 Type 2 diabetes mellitus without complications: Secondary | ICD-10-CM

## 2015-04-03 DIAGNOSIS — E669 Obesity, unspecified: Secondary | ICD-10-CM

## 2015-04-03 DIAGNOSIS — E7800 Pure hypercholesterolemia, unspecified: Secondary | ICD-10-CM

## 2015-04-03 LAB — POCT URINALYSIS DIPSTICK
BILIRUBIN UA: NEGATIVE
Blood, UA: NEGATIVE
GLUCOSE UA: NEGATIVE
KETONES UA: NEGATIVE
LEUKOCYTES UA: NEGATIVE
NITRITE UA: NEGATIVE
PROTEIN UA: NEGATIVE
SPEC GRAV UA: 1.01
Urobilinogen, UA: 0.2
pH, UA: 6

## 2015-04-03 MED ORDER — ACYCLOVIR 400 MG PO TABS: 400.0000 mg | ORAL_TABLET | Freq: Three times a day (TID) | ORAL | Status: DC

## 2015-04-03 NOTE — Progress Notes (Signed)
Patient ID: Lineth Thielke, female   DOB: Mar 17, 1966, 49 y.o.   MRN: [id number]   Subjective:    Patient ID: Charmine Bockrath, female    DOB: July 12, 1965, 49 y.o.   MRN: [id number]  HPI  Patient with past history of hypertension, diabetes and hypercholesterolemia.  She comes in today to follow up on these issues as well as has a concern about some vaginal irritation.  We discussed diet and exercise.  Discussed weight loss.  No chest pain or tightness.  No sob.  No acid reflux.  No abdominal pain or cramping.  Bowels stable.  Some persistent vaginal irritation.  No vaginal discharge.  No intravaginal irritation.  The irritation is localized to one spot - inside vaginal lips.  Has a history of herpes.  Had questions regarding this issue.  Not sexually active now.     Past Medical History  Diagnosis Date  . Hypertension   . Diabetes mellitus ([place])   . Hypercholesterolemia   . Anemia   . History of abnormal Pap smear     s/p LEEP, h/o HPV  . IBS (irritable bowel syndrome)   . Gastritis    Past Surgical History  Procedure Laterality Date  . Appendectomy    . Colonoscopy with propofol N/A 11/28/2014    Procedure: COLONOSCOPY WITH PROPOFOL;  Surgeon: Lollie Sails, MD;  Location: [facility] ENDOSCOPY;  Service: Endoscopy;  Laterality: N/A;   Family History  Problem Relation Age of Onset  . Diabetes Paternal Grandmother   . Emphysema Paternal Grandfather   . Breast cancer Neg Hx   . Colon cancer Neg Hx    Social History   Social History  . Marital Status: Married    Spouse Name: N/A  . Number of Children: 0  . Years of Education: N/A   Social History Main Topics  . Smoking status: Never Smoker   . Smokeless tobacco: Never Used  . Alcohol Use: No  . Drug Use: No  . Sexual Activity: Not Asked   Other Topics Concern  . None   Social History Narrative    Outpatient Encounter Prescriptions as of 04/03/2015  Medication Sig  . fish oil-omega-3 fatty acids 1000 MG capsule  Take 1 g by mouth daily.  Marland Kitchen FLUoxetine (PROZAC) 10 MG capsule TAKE 2 CAPSULES BY MOUTH EVERY DAY  . losartan (COZAAR) 25 MG tablet TAKE 1 TABLET BY MOUTH DAILY  . medroxyPROGESTERone (DEPO-PROVERA) 150 MG/ML injection INJECT 1 ML INTRAMUSCULARLY EVERY THREE MONTHS  . Multiple Vitamin (MULTIVITAMIN) tablet Take 1 tablet by mouth daily.  . pantoprazole (PROTONIX) 40 MG tablet Take 1 tablet (40 mg total) by mouth daily.  . ranitidine (ZANTAC) 150 MG capsule Take 150 mg by mouth every evening.  . zolpidem (AMBIEN) 5 MG tablet Take 1 tablet (5 mg total) by mouth at bedtime as needed for sleep.  Marland Kitchen acyclovir (ZOVIRAX) 400 MG tablet Take 1 tablet (400 mg total) by mouth 3 (three) times daily.   No facility-administered encounter medications on file as of 04/03/2015.    Review of Systems  Constitutional: Negative for appetite change and unexpected weight change.  HENT: Negative for congestion and sinus pressure.   Respiratory: Negative for cough, chest tightness and shortness of breath.   Cardiovascular: Negative for chest pain, palpitations and leg swelling.  Gastrointestinal: Negative for nausea, vomiting, abdominal pain and diarrhea.  Genitourinary: Negative for difficulty urinating.       Some burning with urination.  Discomfort when urine comes in  contact with the irritated area.    Musculoskeletal: Negative for back pain and joint swelling.  Skin: Negative for color change and rash.  Neurological: Negative for dizziness, light-headedness and headaches.  Psychiatric/Behavioral: Negative for dysphoric mood and agitation.       Objective:    Physical Exam  Constitutional: She appears well-developed and well-nourished. No distress.  HENT:  Nose: Nose normal.  Mouth/Throat: Oropharynx is clear and moist.  Eyes: Conjunctivae are normal. Right eye exhibits no discharge. Left eye exhibits no discharge.  Neck: Neck supple. No thyromegaly present.  Cardiovascular: Normal rate and regular  rhythm.   Pulmonary/Chest: Breath sounds normal. No respiratory distress. She has no wheezes.  Abdominal: Soft. Bowel sounds are normal. There is no tenderness.  Genitourinary:  Raised lesion right labia - tender to palpation.  No surrounding erythema.    Musculoskeletal: She exhibits no edema or tenderness.  Lymphadenopathy:    She has no cervical adenopathy.  Skin: No rash noted. No erythema.  Psychiatric: She has a normal mood and affect. Her behavior is normal.    BP 128/90 mmHg  Pulse 75  Temp(Src) 98.7 F (37.1 C) (Oral)  Resp 18  Ht 5' 3.5" (1.613 m)  Wt 200 lb (90.719 kg)  BMI 34.87 kg/m2  SpO2 97% Wt Readings from Last 3 Encounters:  04/03/15 200 lb (90.719 kg)  01/27/15 200 lb 8 oz (90.946 kg)  12/12/14 197 lb (89.359 kg)     Lab Results  Component Value Date   WBC 5.5 08/28/2014   HGB 14.0 08/28/2014   HCT 41.3 08/28/2014   PLT 226.0 08/28/2014   GLUCOSE 103* 01/23/2015   CHOL 208* 01/23/2015   TRIG 182.0* 01/23/2015   HDL 36.40* 01/23/2015   LDLDIRECT 113.0 08/28/2014   LDLCALC 135* 01/23/2015   ALT 22 02/10/2015   AST 16 02/10/2015   NA 140 01/23/2015   K 3.9 01/23/2015   CL 106 01/23/2015   CREATININE 0.90 01/23/2015   BUN 11 01/23/2015   CO2 25 01/23/2015   TSH 0.90 08/28/2014   HGBA1C 6.9* 01/23/2015   MICROALBUR 1.7 08/28/2014       Assessment & Plan:   Problem List Items Addressed This Visit    Anemia    GI w/up previously included colonoscopy as outlined and previous EGD.  Follow cbc.       Diabetes mellitus ([place])    Discussed diet and exercise.  Low carb diet.  Follow met b and a1c.       Family history of colonic polyps    Colonoscopy 11/28/14 as outlined in overview.  Recommended f/u colonoscopy in five years.        GERD (gastroesophageal reflux disease)    On protonix and zantac.  Acid reflux controlled.        Hypercholesterolemia    Low cholesterol diet and exercise.  Follow lipid panel.        Hypertension     Blood pressure has been under good control.  Continue same medication regimen.  Follow pressures.  Follow metabolic panel.        Obesity (BMI 30-39.9)    Discussed diet and exercise.        Obstructive sleep apnea    Reviewed her sleep report.  Using her CPAP regularly.  (only missed two nights over the last 30 days).  She is using [place].  Is getting more used to the mask and machine.  Using (on average) more than 5 hours per night.  Follow.        Vaginal irritation    States has a history of herpes infection.  Question of reoccurring flare.  Acyclovir as directed.  Follow.         Other Visit Diagnoses    Burning with urination    -  Primary    Relevant Orders    POCT Urinalysis Dipstick (Completed)    Urine Culture (Completed)    Vaginal itching        Relevant Orders    POCT Urinalysis Dipstick (Completed)    Urine Culture (Completed)        Einar Pheasant, MD

## 2015-04-03 NOTE — Progress Notes (Signed)
Pre-visit discussion using our clinic review tool. No additional management support is needed unless otherwise documented below in the visit note.  

## 2015-04-05 ENCOUNTER — Encounter: Payer: Self-pay | Admitting: Internal Medicine

## 2015-04-05 DIAGNOSIS — N898 Other specified noninflammatory disorders of vagina: Secondary | ICD-10-CM | POA: Insufficient documentation

## 2015-04-05 LAB — URINE CULTURE
Colony Count: NO GROWTH
ORGANISM ID, BACTERIA: NO GROWTH

## 2015-04-05 NOTE — Assessment & Plan Note (Signed)
Reviewed her sleep report.  Using her CPAP regularly.  (only missed two nights over the last 30 days).  [place] is using [place].  Is getting more used to the mask and machine.  Using (on average) more than 5 hours per night.  Follow.

## 2015-04-05 NOTE — Assessment & Plan Note (Signed)
Blood pressure has been under good control.  Continue same medication regimen.  Follow pressures.  Follow metabolic panel.   

## 2015-04-05 NOTE — Assessment & Plan Note (Signed)
Discussed diet and exercise.  Low carb diet.  Follow met b and a1c.   

## 2015-04-05 NOTE — Assessment & Plan Note (Signed)
Low cholesterol diet and exercise.  Follow lipid panel.   

## 2015-04-05 NOTE — Assessment & Plan Note (Signed)
GI w/up previously included colonoscopy as outlined and previous EGD.  Follow cbc.

## 2015-04-05 NOTE — Assessment & Plan Note (Signed)
On protonix and zantac.  Acid reflux controlled.

## 2015-04-05 NOTE — Assessment & Plan Note (Signed)
Discussed diet and exercise 

## 2015-04-05 NOTE — Assessment & Plan Note (Signed)
Colonoscopy 11/28/14 as outlined in overview.  Recommended f/u colonoscopy in five years.

## 2015-04-05 NOTE — Assessment & Plan Note (Signed)
States has a history of herpes infection.  Question of reoccurring flare.  Acyclovir as directed.  Follow.

## 2015-04-24 ENCOUNTER — Other Ambulatory Visit: Payer: Self-pay | Admitting: Internal Medicine

## 2015-04-27 NOTE — Telephone Encounter (Signed)
Patient was called and told rx was faxed to pharmacy.

## 2015-04-27 NOTE — Telephone Encounter (Signed)
ok'd ambien #30 with no refills.  rx signed and placed on your desk.  Thanks

## 2015-04-27 NOTE — Telephone Encounter (Signed)
Please advise 

## 2015-05-08 ENCOUNTER — Other Ambulatory Visit (INDEPENDENT_AMBULATORY_CARE_PROVIDER_SITE_OTHER): Payer: BC Managed Care – PPO

## 2015-05-08 DIAGNOSIS — E7800 Pure hypercholesterolemia, unspecified: Secondary | ICD-10-CM

## 2015-05-08 DIAGNOSIS — D649 Anemia, unspecified: Secondary | ICD-10-CM

## 2015-05-08 DIAGNOSIS — E119 Type 2 diabetes mellitus without complications: Secondary | ICD-10-CM

## 2015-05-08 LAB — CBC WITH DIFFERENTIAL/PLATELET
BASOS ABS: 0 10*3/uL (ref 0.0–0.1)
Basophils Relative: 0.6 % (ref 0.0–3.0)
EOS ABS: 0.1 10*3/uL (ref 0.0–0.7)
Eosinophils Relative: 1.7 % (ref 0.0–5.0)
HEMATOCRIT: 43 % (ref 36.0–46.0)
Hemoglobin: 14.5 g/dL (ref 12.0–15.0)
LYMPHS PCT: 30.1 % (ref 12.0–46.0)
Lymphs Abs: 1.7 10*3/uL (ref 0.7–4.0)
MCHC: 33.7 g/dL (ref 30.0–36.0)
MCV: 89.7 fl (ref 78.0–100.0)
Monocytes Absolute: 0.4 10*3/uL (ref 0.1–1.0)
Monocytes Relative: 7.3 % (ref 3.0–12.0)
NEUTROS ABS: 3.4 10*3/uL (ref 1.4–7.7)
NEUTROS PCT: 60.3 % (ref 43.0–77.0)
PLATELETS: 206 10*3/uL (ref 150.0–400.0)
RBC: 4.8 Mil/uL (ref 3.87–5.11)
RDW: 13.4 % (ref 11.5–15.5)
WBC: 5.7 10*3/uL (ref 4.0–10.5)

## 2015-05-08 LAB — HEPATIC FUNCTION PANEL
ALK PHOS: 77 U/L (ref 39–117)
ALT: 24 U/L (ref 0–35)
AST: 18 U/L (ref 0–37)
Albumin: 3.9 g/dL (ref 3.5–5.2)
BILIRUBIN DIRECT: 0.1 mg/dL (ref 0.0–0.3)
BILIRUBIN TOTAL: 0.6 mg/dL (ref 0.2–1.2)
TOTAL PROTEIN: 7.2 g/dL (ref 6.0–8.3)

## 2015-05-08 LAB — FERRITIN: Ferritin: 35.4 ng/mL (ref 10.0–291.0)

## 2015-05-08 LAB — BASIC METABOLIC PANEL
BUN: 12 mg/dL (ref 6–23)
CALCIUM: 9.2 mg/dL (ref 8.4–10.5)
CO2: 22 meq/L (ref 19–32)
Chloride: 105 mEq/L (ref 96–112)
Creatinine, Ser: 0.86 mg/dL (ref 0.40–1.20)
GFR: 75.06 mL/min (ref >60.00)
GLUCOSE: 139 mg/dL — AB (ref 70–99)
Potassium: 4 mEq/L (ref 3.5–5.1)
Sodium: 136 mEq/L (ref 135–145)

## 2015-05-08 LAB — LIPID PANEL
CHOL/HDL RATIO: 6
CHOLESTEROL: 218 mg/dL — AB (ref 0–200)
HDL: 35.8 mg/dL — ABNORMAL LOW (ref >39.00)
NONHDL: 181.81
Triglycerides: 299 mg/dL — ABNORMAL HIGH (ref 0.0–149.0)
VLDL: 59.8 mg/dL — ABNORMAL HIGH (ref 0.0–40.0)

## 2015-05-08 LAB — LDL CHOLESTEROL, DIRECT: Direct LDL: 144 mg/dL

## 2015-05-08 LAB — HEMOGLOBIN A1C: Hgb A1c MFr Bld: 7.1 % — ABNORMAL HIGH (ref 4.6–6.5)

## 2015-05-09 ENCOUNTER — Encounter: Payer: Self-pay | Admitting: Internal Medicine

## 2015-05-11 ENCOUNTER — Encounter: Payer: Self-pay | Admitting: Internal Medicine

## 2015-05-11 ENCOUNTER — Ambulatory Visit (INDEPENDENT_AMBULATORY_CARE_PROVIDER_SITE_OTHER): Payer: BC Managed Care – PPO | Admitting: Internal Medicine

## 2015-05-11 VITALS — BP 120/86 | HR 80 | Temp 98.6°F | Resp 18 | Ht 63.5 in | Wt 198.8 lb

## 2015-05-11 DIAGNOSIS — E119 Type 2 diabetes mellitus without complications: Secondary | ICD-10-CM

## 2015-05-11 DIAGNOSIS — E7800 Pure hypercholesterolemia, unspecified: Secondary | ICD-10-CM

## 2015-05-11 DIAGNOSIS — D649 Anemia, unspecified: Secondary | ICD-10-CM

## 2015-05-11 DIAGNOSIS — I10 Essential (primary) hypertension: Secondary | ICD-10-CM

## 2015-05-11 DIAGNOSIS — K219 Gastro-esophageal reflux disease without esophagitis: Secondary | ICD-10-CM

## 2015-05-11 DIAGNOSIS — G4733 Obstructive sleep apnea (adult) (pediatric): Secondary | ICD-10-CM

## 2015-05-11 DIAGNOSIS — Z308 Encounter for other contraceptive management: Secondary | ICD-10-CM | POA: Diagnosis not present

## 2015-05-11 DIAGNOSIS — E669 Obesity, unspecified: Secondary | ICD-10-CM

## 2015-05-11 DIAGNOSIS — Z1239 Encounter for other screening for malignant neoplasm of breast: Secondary | ICD-10-CM

## 2015-05-11 MED ORDER — MEDROXYPROGESTERONE ACETATE 150 MG/ML IM SUSP
150.0000 mg | Freq: Once | INTRAMUSCULAR | Status: AC
  Administered 2015-05-11: 150 mg via INTRAMUSCULAR

## 2015-05-11 MED ORDER — PRAVASTATIN SODIUM 10 MG PO TABS: 10.0000 mg | ORAL_TABLET | Freq: Every day | ORAL | Status: DC

## 2015-05-11 NOTE — Progress Notes (Signed)
Patient ID: Anne [name], female   DOB: [birth year], 49 y.o.   MRN: [id number]   Subjective:    Patient ID: Anne [name], female    DOB: Aug 16, 1965, 49 y.o.   MRN: [id number]  HPI  Patient with past history of hypercholesterolemia, diabetes, sleep apnea and hypertension.  She comes in today for a scheduled follow up.  Her recent labs revealed a1c 7.1.  LDL cholesterol and triglycerides elevated.  Discussed with her today.  Discussed diet and exercise.  She has not been exercising.  Plans to start.  Discussed diet.  She plans to get more serious about her diet.  Wants to work on diet and exercise before starting diabetic medication.  She did agree to cholesterol medication.  No chest pain or tightness.  No sob.  No acid reflux.  No abdominal pain or cramping.  Bowels stable.     Past Medical History  Diagnosis Date  . Hypertension   . Diabetes mellitus ([place])   . Hypercholesterolemia   . Anemia   . History of abnormal Pap smear     s/p LEEP, h/o HPV  . IBS (irritable bowel syndrome)   . Gastritis    Past Surgical History  Procedure Laterality Date  . Appendectomy    . Colonoscopy with propofol N/A 11/28/2014    Procedure: COLONOSCOPY WITH PROPOFOL;  Surgeon: Lollie Sails, MD;  Location: [facility] ENDOSCOPY;  Service: Endoscopy;  Laterality: N/A;   Family History  Problem Relation Age of Onset  . Diabetes Paternal Grandmother   . Emphysema Paternal Grandfather   . Breast cancer Neg Hx   . Colon cancer Neg Hx    Social History   Social History  . Marital Status: Married    Spouse Name: N/A  . Number of Children: 0  . Years of Education: N/A   Social History Main Topics  . Smoking status: Never Smoker   . Smokeless tobacco: Never Used  . Alcohol Use: No  . Drug Use: No  . Sexual Activity: Not Asked   Other Topics Concern  . None   Social History Narrative    Outpatient Encounter Prescriptions as of 05/11/2015  Medication Sig  . fish oil-omega-3 fatty  acids 1000 MG capsule Take 1 g by mouth daily.  Marland Kitchen FLUoxetine (PROZAC) 10 MG capsule TAKE 2 CAPSULES BY MOUTH EVERY DAY  . losartan (COZAAR) 25 MG tablet TAKE 1 TABLET BY MOUTH DAILY  . medroxyPROGESTERone (DEPO-PROVERA) 150 MG/ML injection INJECT 1 ML INTRAMUSCULARLY EVERY THREE MONTHS  . Multiple Vitamin (MULTIVITAMIN) tablet Take 1 tablet by mouth daily.  . pantoprazole (PROTONIX) 40 MG tablet Take 1 tablet (40 mg total) by mouth daily.  . ranitidine (ZANTAC) 150 MG capsule Take 150 mg by mouth every evening.  . zolpidem (AMBIEN) 5 MG tablet TAKE 1 TABLET BY MOUTH AT BEDTIME AS NEEDED FOR SLEEP  . pravastatin (PRAVACHOL) 10 MG tablet Take 1 tablet (10 mg total) by mouth daily.  . [DISCONTINUED] acyclovir (ZOVIRAX) 400 MG tablet Take 1 tablet (400 mg total) by mouth 3 (three) times daily.  . [EXPIRED] medroxyPROGESTERone (DEPO-PROVERA) injection 150 mg    No facility-administered encounter medications on file as of 05/11/2015.    Review of Systems  Constitutional: Negative for fever.       Not exercising.  Discussed diet and exercise.    HENT: Negative for congestion and sinus pressure.   Respiratory: Negative for cough, chest tightness and shortness of breath.   Cardiovascular: Negative for chest  pain, palpitations and leg swelling.  Gastrointestinal: Negative for nausea, vomiting, abdominal pain and diarrhea.  Genitourinary: Negative for dysuria and difficulty urinating.  Musculoskeletal: Negative for back pain and joint swelling.  Skin: Negative for color change and rash.  Neurological: Negative for dizziness, light-headedness and headaches.  Psychiatric/Behavioral: Negative for dysphoric mood and agitation.       Objective:     Blood pressure rechecked by me:  134-136/84-86  Physical Exam  Constitutional: She appears well-developed and well-nourished. No distress.  HENT:  Nose: Nose normal.  Mouth/Throat: Oropharynx is clear and moist.  Eyes: Right eye exhibits no  discharge. Left eye exhibits no discharge.  Neck: Neck supple. No thyromegaly present.  Cardiovascular: Normal rate and regular rhythm.   Pulmonary/Chest: Breath sounds normal. No respiratory distress. She has no wheezes.  Abdominal: Soft. Bowel sounds are normal. There is no tenderness.  Musculoskeletal: She exhibits no edema or tenderness.  Lymphadenopathy:    She has no cervical adenopathy.  Skin: No rash noted. No erythema.  Psychiatric: She has a normal mood and affect. Her behavior is normal.    BP 120/86 mmHg  Pulse 80  Temp(Src) 98.6 F (37 C) (Oral)  Resp 18  Ht 5' 3.5" (1.613 m)  Wt 198 lb 12 oz (90.152 kg)  BMI 34.65 kg/m2  SpO2 96% Wt Readings from Last 3 Encounters:  05/11/15 198 lb 12 oz (90.152 kg)  04/03/15 200 lb (90.719 kg)  01/27/15 200 lb 8 oz (90.946 kg)     Lab Results  Component Value Date   WBC 5.7 05/08/2015   HGB 14.5 05/08/2015   HCT 43.0 05/08/2015   PLT 206.0 05/08/2015   GLUCOSE 139* 05/08/2015   CHOL 218* 05/08/2015   TRIG 299.0* 05/08/2015   HDL 35.80* 05/08/2015   LDLDIRECT 144.0 05/08/2015   LDLCALC 135* 01/23/2015   ALT 24 05/08/2015   AST 18 05/08/2015   NA 136 05/08/2015   K 4.0 05/08/2015   CL 105 05/08/2015   CREATININE 0.86 05/08/2015   BUN 12 05/08/2015   CO2 22 05/08/2015   TSH 0.90 08/28/2014   HGBA1C 7.1* 05/08/2015   MICROALBUR 1.7 08/28/2014       Assessment & Plan:   Problem List Items Addressed This Visit    Anemia    Previous colonoscopy and EGD as outlined.  Recent hgb wnl.       Diabetes mellitus (H[facility])    a1c 7.1.  Discussed diet and exercise.  Wants to work on diet and exercise before starting medication.  Follow met b and a1c.        Relevant Medications   pravastatin (PRAVACHOL) 10 MG tablet   GERD (gastroesophageal reflux disease)    Upper symptoms controlled on current medication regimen.  Follow.       Hypercholesterolemia    LDL increased.  Discussed diet and exercise.  Start pravastatin  78m q day.  Follow.  Check liver panel in 6 weeks.        Relevant Medications   pravastatin (PRAVACHOL) 10 MG tablet   Other Relevant Orders   Hepatic function panel   Hypertension    Blood pressure as outlined.  Continue same medication for now.  Diet and exercise.  Follow pressures.  Have her spot check her pressure.  If elevation, increase losartan to 573mq day.        Relevant Medications   pravastatin (PRAVACHOL) 10 MG tablet   Obesity (BMI 30-39.9)    Diet and exercise.  Follow.       Obstructive sleep apnea    Using cpap.  Needs ambien to wear the mask.  Follow.  Trying to go nights without ambien.  Follow.         Other Visit Diagnoses    Encounter for other contraceptive management    -  Primary    Relevant Medications    medroxyPROGESTERone (DEPO-PROVERA) injection 150 mg (Completed)    Breast cancer screening        Relevant Orders    MM DIGITAL SCREENING BILATERAL        Einar Pheasant, MD

## 2015-05-11 NOTE — Progress Notes (Signed)
Pre-visit discussion using our clinic review tool. No additional management support is needed unless otherwise documented below in the visit note.  

## 2015-05-12 ENCOUNTER — Encounter: Payer: Self-pay | Admitting: Internal Medicine

## 2015-05-12 NOTE — Assessment & Plan Note (Signed)
Previous colonoscopy and EGD as outlined.  Recent hgb wnl.

## 2015-05-12 NOTE — Assessment & Plan Note (Signed)
a1c 7.1.  Discussed diet and exercise.  Wants to work on diet and exercise before starting medication.  Follow met b and a1c.

## 2015-05-12 NOTE — Assessment & Plan Note (Signed)
LDL increased.  Discussed diet and exercise.  Start pravastatin 10mg  q day.  Follow.  Check liver panel in 6 weeks.

## 2015-05-12 NOTE — Assessment & Plan Note (Signed)
Upper symptoms controlled on current medication regimen.  Follow.

## 2015-05-12 NOTE — Assessment & Plan Note (Signed)
Using cpap.  Needs ambien to wear the mask.  Follow.  Trying to go nights without ambien.  Follow.

## 2015-05-12 NOTE — Assessment & Plan Note (Signed)
Blood pressure as outlined.  Continue same medication for now.  Diet and exercise.  Follow pressures.  Have her spot check her pressure.  If elevation, increase losartan to 50mg  q day.

## 2015-05-12 NOTE — Assessment & Plan Note (Signed)
Diet and exercise.  Follow.  

## 2015-05-20 ENCOUNTER — Encounter: Payer: Self-pay | Admitting: Internal Medicine

## 2015-05-20 NOTE — Telephone Encounter (Signed)
This needs a prior authorization.  This pt is on ambien for her sleep apnea.  She is using it and needs it nightly until she can get used to her CPAP.  If I need to send in a new rx, let me know, but it sounds like this just needs a prior authorization.  If you are unable to do this, please forward this to someone who can do this asap.  Thanks

## 2015-05-20 NOTE — Telephone Encounter (Signed)
Noted.   See me tomorrow.

## 2015-05-21 NOTE — Telephone Encounter (Signed)
I called patient's phone, left a detailed message for her to return my call with any questions or concerns.  I will process her PA on January 3rd when our office reopens after the 1st of the year.

## 2015-05-22 ENCOUNTER — Other Ambulatory Visit: Payer: Self-pay | Admitting: Internal Medicine

## 2015-05-22 NOTE — Telephone Encounter (Signed)
rx ok'd for ambien #30 with no refills.  rx in your box.

## 2015-05-25 NOTE — Telephone Encounter (Signed)
Rx faxed to [facility] on 05/22/15

## 2015-06-11 NOTE — Telephone Encounter (Signed)
Based on Prior My chart message attempted to complete a PA for this patient.  Submitted to [place] at [id number].  Used ICD10 G47.33 per Cordelia PenSherry this will be sent to a pharmacist for review due to exceeding the plan approval of 45pill in 75 days.  Patient takes 30pills in 30 days. Result will be faxed in 72 hours to the office with response.  Thanks

## 2015-06-18 ENCOUNTER — Encounter: Payer: Self-pay | Admitting: Internal Medicine

## 2015-06-22 ENCOUNTER — Other Ambulatory Visit (INDEPENDENT_AMBULATORY_CARE_PROVIDER_SITE_OTHER): Payer: BC Managed Care – PPO

## 2015-06-22 DIAGNOSIS — E7800 Pure hypercholesterolemia, unspecified: Secondary | ICD-10-CM

## 2015-06-23 ENCOUNTER — Encounter: Payer: Self-pay | Admitting: Internal Medicine

## 2015-06-23 LAB — HEPATIC FUNCTION PANEL
ALK PHOS: 78 U/L (ref 39–117)
ALT: 27 U/L (ref 0–35)
AST: 19 U/L (ref 0–37)
Albumin: 4.1 g/dL (ref 3.5–5.2)
BILIRUBIN TOTAL: 0.3 mg/dL (ref 0.2–1.2)
Bilirubin, Direct: 0 mg/dL (ref 0.0–0.3)
Total Protein: 7.4 g/dL (ref 6.0–8.3)

## 2015-06-24 ENCOUNTER — Encounter: Payer: Self-pay | Admitting: Internal Medicine

## 2015-06-30 ENCOUNTER — Other Ambulatory Visit: Payer: Self-pay | Admitting: Internal Medicine

## 2015-06-30 NOTE — Telephone Encounter (Signed)
Pt called about her medication is zolpidem (AMBIEN) 5 MG tablet. Pharmacy is [id number] - [place], [place] - [address] Call pt @ [phone]. Thank you!

## 2015-06-30 NOTE — Telephone Encounter (Signed)
Ok to refill last ov 05/11/15?

## 2015-06-30 NOTE — Telephone Encounter (Signed)
rx ok'd for ambien #30 with 1 refill.  

## 2015-07-01 NOTE — Telephone Encounter (Signed)
Rx faxed to [facility] on 06/30/15

## 2015-07-22 ENCOUNTER — Other Ambulatory Visit: Payer: Self-pay | Admitting: Internal Medicine

## 2015-07-22 NOTE — Telephone Encounter (Signed)
Last Visit 12/16 ok to fill?

## 2015-07-22 NOTE — Telephone Encounter (Signed)
ok'd refill prozac #180 with one refill.

## 2015-07-30 ENCOUNTER — Other Ambulatory Visit: Payer: Self-pay | Admitting: Internal Medicine

## 2015-07-31 ENCOUNTER — Other Ambulatory Visit: Payer: Self-pay

## 2015-07-31 ENCOUNTER — Ambulatory Visit (INDEPENDENT_AMBULATORY_CARE_PROVIDER_SITE_OTHER): Payer: BC Managed Care – PPO

## 2015-07-31 DIAGNOSIS — Z308 Encounter for other contraceptive management: Secondary | ICD-10-CM | POA: Diagnosis not present

## 2015-07-31 MED ORDER — PRAVASTATIN SODIUM 10 MG PO TABS: ORAL_TABLET | ORAL | Status: DC

## 2015-07-31 MED ORDER — MEDROXYPROGESTERONE ACETATE 150 MG/ML IM SUSP
150.0000 mg | Freq: Once | INTRAMUSCULAR | Status: AC
  Administered 2015-07-31: 150 mg via INTRAMUSCULAR

## 2015-07-31 NOTE — Progress Notes (Signed)
Patient came in for Depo injection.  Received in left deltoid.  Patient tolerated well.  Received calendar for next injection date.

## 2015-08-03 ENCOUNTER — Ambulatory Visit: Payer: BC Managed Care – PPO

## 2015-08-06 ENCOUNTER — Ambulatory Visit
Admission: RE | Admit: 2015-08-06 | Discharge: 2015-08-06 | Disposition: A | Discharge status: Home / Self Care | Payer: BC Managed Care – PPO | Source: Ambulatory Visit | Attending: Internal Medicine | Admitting: Internal Medicine

## 2015-08-06 DIAGNOSIS — Z1239 Encounter for other screening for malignant neoplasm of breast: Secondary | ICD-10-CM

## 2015-08-06 DIAGNOSIS — Z1231 Encounter for screening mammogram for malignant neoplasm of breast: Secondary | ICD-10-CM | POA: Diagnosis not present

## 2015-08-07 ENCOUNTER — Encounter: Payer: Self-pay | Admitting: Internal Medicine

## 2015-08-19 ENCOUNTER — Other Ambulatory Visit: Payer: Self-pay | Admitting: Internal Medicine

## 2015-08-24 ENCOUNTER — Other Ambulatory Visit: Payer: Self-pay | Admitting: Internal Medicine

## 2015-08-25 NOTE — Telephone Encounter (Signed)
ok'd refill for ambien #30 with one refill.  

## 2015-08-25 NOTE — Telephone Encounter (Signed)
Refilled on 07/03/15 with one refill. Last seen in October. Please advise?

## 2015-08-25 NOTE — Telephone Encounter (Signed)
Faxed to pharmacy

## 2015-09-09 ENCOUNTER — Telehealth: Payer: Self-pay | Admitting: *Deleted

## 2015-09-09 ENCOUNTER — Other Ambulatory Visit (INDEPENDENT_AMBULATORY_CARE_PROVIDER_SITE_OTHER): Payer: BC Managed Care – PPO

## 2015-09-09 DIAGNOSIS — E7800 Pure hypercholesterolemia, unspecified: Secondary | ICD-10-CM

## 2015-09-09 DIAGNOSIS — E119 Type 2 diabetes mellitus without complications: Secondary | ICD-10-CM | POA: Diagnosis not present

## 2015-09-09 DIAGNOSIS — D649 Anemia, unspecified: Secondary | ICD-10-CM | POA: Diagnosis not present

## 2015-09-09 DIAGNOSIS — I10 Essential (primary) hypertension: Secondary | ICD-10-CM

## 2015-09-09 LAB — CBC WITH DIFFERENTIAL/PLATELET
Basophils Absolute: 0 10*3/uL (ref 0.0–0.1)
Basophils Relative: 0.5 % (ref 0.0–3.0)
EOS PCT: 1.8 % (ref 0.0–5.0)
Eosinophils Absolute: 0.1 10*3/uL (ref 0.0–0.7)
HCT: 41.5 % (ref 36.0–46.0)
HEMOGLOBIN: 14.1 g/dL (ref 12.0–15.0)
Lymphocytes Relative: 34.8 % (ref 12.0–46.0)
Lymphs Abs: 1.7 10*3/uL (ref 0.7–4.0)
MCHC: 33.8 g/dL (ref 30.0–36.0)
MCV: 90 fl (ref 78.0–100.0)
MONO ABS: 0.4 10*3/uL (ref 0.1–1.0)
MONOS PCT: 7.6 % (ref 3.0–12.0)
Neutro Abs: 2.8 10*3/uL (ref 1.4–7.7)
Neutrophils Relative %: 55.3 % (ref 43.0–77.0)
Platelets: 213 10*3/uL (ref 150.0–400.0)
RBC: 4.62 Mil/uL (ref 3.87–5.11)
RDW: 13.3 % (ref 11.5–15.5)
WBC: 5 10*3/uL (ref 4.0–10.5)

## 2015-09-09 LAB — HEPATIC FUNCTION PANEL
ALBUMIN: 4.3 g/dL (ref 3.5–5.2)
ALT: 42 U/L — ABNORMAL HIGH (ref 0–35)
AST: 33 U/L (ref 0–37)
Alkaline Phosphatase: 65 U/L (ref 39–117)
Bilirubin, Direct: 0.1 mg/dL (ref 0.0–0.3)
TOTAL PROTEIN: 7 g/dL (ref 6.0–8.3)
Total Bilirubin: 0.6 mg/dL (ref 0.2–1.2)

## 2015-09-09 LAB — LIPID PANEL
CHOL/HDL RATIO: 5
Cholesterol: 204 mg/dL — ABNORMAL HIGH (ref 0–200)
HDL: 41.2 mg/dL (ref >39.00)
LDL CALC: 124 mg/dL — AB (ref 0–99)
NONHDL: 163.28
TRIGLYCERIDES: 194 mg/dL — AB (ref 0.0–149.0)
VLDL: 38.8 mg/dL (ref 0.0–40.0)

## 2015-09-09 LAB — BASIC METABOLIC PANEL
BUN: 16 mg/dL (ref 6–23)
CHLORIDE: 108 meq/L (ref 96–112)
CO2: 25 mEq/L (ref 19–32)
Calcium: 9.6 mg/dL (ref 8.4–10.5)
Creatinine, Ser: 0.83 mg/dL (ref 0.40–1.20)
GFR: 78.09 mL/min (ref >60.00)
Glucose, Bld: 148 mg/dL — ABNORMAL HIGH (ref 70–99)
POTASSIUM: 4.3 meq/L (ref 3.5–5.1)
SODIUM: 139 meq/L (ref 135–145)

## 2015-09-09 LAB — HEMOGLOBIN A1C: Hgb A1c MFr Bld: 7.5 % — ABNORMAL HIGH (ref 4.6–6.5)

## 2015-09-09 LAB — TSH: TSH: 0.94 u[IU]/mL (ref 0.35–4.50)

## 2015-09-09 LAB — FERRITIN: Ferritin: 40.6 ng/mL (ref 10.0–291.0)

## 2015-09-09 NOTE — Telephone Encounter (Signed)
Orders placed for labs

## 2015-09-09 NOTE — Telephone Encounter (Signed)
Labs and dx?  

## 2015-09-10 ENCOUNTER — Encounter: Payer: Self-pay | Admitting: Internal Medicine

## 2015-09-11 ENCOUNTER — Encounter: Payer: Self-pay | Admitting: Internal Medicine

## 2015-09-11 ENCOUNTER — Ambulatory Visit (INDEPENDENT_AMBULATORY_CARE_PROVIDER_SITE_OTHER): Payer: BC Managed Care – PPO | Admitting: Internal Medicine

## 2015-09-11 VITALS — BP 110/80 | HR 87 | Temp 98.7°F | Resp 18 | Ht 63.5 in | Wt 206.4 lb

## 2015-09-11 DIAGNOSIS — E7800 Pure hypercholesterolemia, unspecified: Secondary | ICD-10-CM

## 2015-09-11 DIAGNOSIS — G4733 Obstructive sleep apnea (adult) (pediatric): Secondary | ICD-10-CM | POA: Diagnosis not present

## 2015-09-11 DIAGNOSIS — D649 Anemia, unspecified: Secondary | ICD-10-CM

## 2015-09-11 DIAGNOSIS — I10 Essential (primary) hypertension: Secondary | ICD-10-CM

## 2015-09-11 DIAGNOSIS — E669 Obesity, unspecified: Secondary | ICD-10-CM

## 2015-09-11 DIAGNOSIS — K219 Gastro-esophageal reflux disease without esophagitis: Secondary | ICD-10-CM | POA: Diagnosis not present

## 2015-09-11 DIAGNOSIS — E119 Type 2 diabetes mellitus without complications: Secondary | ICD-10-CM

## 2015-09-11 MED ORDER — METFORMIN HCL 500 MG PO TABS: 500.0000 mg | ORAL_TABLET | Freq: Every day | ORAL | Status: DC

## 2015-09-11 NOTE — Progress Notes (Signed)
Patient ID: Anne [name], female   DOB: [birth year], 50 y.o.   MRN: [id number]   Subjective:    Patient ID: Anne [name], female    DOB: 1965-11-17, 50 y.o.   MRN: [id number]  HPI  Patient here for a scheduled follow up.  We discussed her recent lab results.  Her a1c has increased.  Discussed diet and exercise.  Discussed low carb diet.  Discussed starting metformin.  She has just recently started watching her diet.  Discussed the need for exercise.  She plans to start exercising more.  No chest pain or tightness.  No sob.  No acid reflux.  No abdominal pain or cramping.  No urine change.  Bowels stable.     Past Medical History  Diagnosis Date  . Hypertension   . Diabetes mellitus ([place])   . Hypercholesterolemia   . Anemia   . History of abnormal Pap smear     s/p LEEP, h/o HPV  . IBS (irritable bowel syndrome)   . Gastritis    Past Surgical History  Procedure Laterality Date  . Appendectomy    . Colonoscopy with propofol N/A 11/28/2014    Procedure: COLONOSCOPY WITH PROPOFOL;  Surgeon: Lollie Sails, MD;  Location: [facility] ENDOSCOPY;  Service: Endoscopy;  Laterality: N/A;   Family History  Problem Relation Age of Onset  . Diabetes Paternal Grandmother   . Emphysema Paternal Grandfather   . Colon cancer Neg Hx   . Breast cancer Sister 71   Social History   Social History  . Marital Status: Married    Spouse Name: N/A  . Number of Children: 0  . Years of Education: N/A   Social History Main Topics  . Smoking status: Never Smoker   . Smokeless tobacco: Never Used  . Alcohol Use: No  . Drug Use: No  . Sexual Activity: Not Asked   Other Topics Concern  . None   Social History Narrative    Outpatient Encounter Prescriptions as of 09/11/2015  Medication Sig  . BIOTIN PO Take by mouth.  . fish oil-omega-3 fatty acids 1000 MG capsule Take 1 g by mouth daily.  Marland Kitchen FLUoxetine (PROZAC) 10 MG capsule TAKE 2 CAPSULES BY MOUTH EVERY DAY  . losartan (COZAAR) 25  MG tablet TAKE 1 TABLET BY MOUTH DAILY  . medroxyPROGESTERone (DEPO-PROVERA) 150 MG/ML injection INJECT 1 ML INTRAMUSCULARLY EVERY THREE MONTHS  . Multiple Vitamin (MULTIVITAMIN) tablet Take 1 tablet by mouth daily.  . ONE TOUCH ULTRA TEST test strip TEST BLOOD SUGAR DAILY  . pantoprazole (PROTONIX) 40 MG tablet Take 1 tablet (40 mg total) by mouth daily.  . pravastatin (PRAVACHOL) 10 MG tablet TAKE 1 TABLET (10 MG TOTAL) BY MOUTH DAILY.  . ranitidine (ZANTAC) 150 MG capsule Take 150 mg by mouth every evening.  . zolpidem (AMBIEN) 5 MG tablet TAKE 1 TABLET BY MOUTH AT BEDTIME AS NEEDED FOR SLEEP  . metFORMIN (GLUCOPHAGE) 500 MG tablet Take 1 tablet (500 mg total) by mouth daily.   No facility-administered encounter medications on file as of 09/11/2015.    Review of Systems  Constitutional:       Has not been watching her diet.  Has not been exercising.   HENT: Negative for congestion and sinus pressure.   Respiratory: Negative for cough, chest tightness and shortness of breath.   Cardiovascular: Negative for chest pain, palpitations and leg swelling.  Gastrointestinal: Negative for nausea, vomiting, abdominal pain and diarrhea.  Genitourinary: Negative for dysuria and difficulty  urinating.  Musculoskeletal: Negative for back pain and joint swelling.  Skin: Negative for color change and rash.  Neurological: Negative for dizziness, light-headedness and headaches.  Psychiatric/Behavioral: Negative for dysphoric mood and agitation.       Objective:     Blood pressure rechecked by me:  118/78  Physical Exam  Constitutional: She appears well-developed and well-nourished. No distress.  HENT:  Nose: Nose normal.  Mouth/Throat: Oropharynx is clear and moist.  Neck: Neck supple. No thyromegaly present.  Cardiovascular: Normal rate and regular rhythm.   Pulmonary/Chest: Breath sounds normal. No respiratory distress. She has no wheezes.  Abdominal: Soft. Bowel sounds are normal. There is no  tenderness.  Musculoskeletal: She exhibits no edema or tenderness.  Lymphadenopathy:    She has no cervical adenopathy.  Skin: No rash noted. No erythema.  Psychiatric: She has a normal mood and affect. Her behavior is normal.    BP 110/80 mmHg  Pulse 87  Temp(Src) 98.7 F (37.1 C) (Oral)  Resp 18  Ht 5' 3.5" (1.613 m)  Wt 206 lb 6 oz (93.611 kg)  BMI 35.98 kg/m2  SpO2 97% Wt Readings from Last 3 Encounters:  09/11/15 206 lb 6 oz (93.611 kg)  05/11/15 198 lb 12 oz (90.152 kg)  04/03/15 200 lb (90.719 kg)     Lab Results  Component Value Date   WBC 5.0 09/09/2015   HGB 14.1 09/09/2015   HCT 41.5 09/09/2015   PLT 213.0 09/09/2015   GLUCOSE 148* 09/09/2015   CHOL 204* 09/09/2015   TRIG 194.0* 09/09/2015   HDL 41.20 09/09/2015   LDLDIRECT 144.0 05/08/2015   LDLCALC 124* 09/09/2015   ALT 42* 09/09/2015   AST 33 09/09/2015   NA 139 09/09/2015   K 4.3 09/09/2015   CL 108 09/09/2015   CREATININE 0.83 09/09/2015   BUN 16 09/09/2015   CO2 25 09/09/2015   TSH 0.94 09/09/2015   HGBA1C 7.5* 09/09/2015   MICROALBUR 1.7 08/28/2014    Mm Digital Screening Bilateral  08/07/2015  CLINICAL DATA:  Screening. EXAM: DIGITAL SCREENING BILATERAL MAMMOGRAM WITH CAD COMPARISON:  Previous exam(s). ACR Breast Density Category b: There are scattered areas of fibroglandular density. FINDINGS: There are no findings suspicious for malignancy. Images were processed with CAD. IMPRESSION: No mammographic evidence of malignancy. A result letter of this screening mammogram will be mailed directly to the patient. RECOMMENDATION: Screening mammogram in one year. (Code:SM-B-01Y) BI-RADS CATEGORY  1: Negative. Electronically Signed   By: Margarette Canada M.D.   On: 08/07/2015 09:08       Assessment & Plan:   Problem List Items Addressed This Visit    Anemia    Colonoscopy 11/28/14 - as outlined.  Recommended f/u colonoscopy in 5 years.  Follow cbc and ferritin.       Relevant Orders   CBC with  Differential/Platelet   Ferritin   Diabetes mellitus ([place])    Low carb diet and exercise.  Follow met b and a1c.  a1c increased on recent check.  Start metformin as directed.  Follow for low sugars.  Discussed diet, exercise and weight loss.       Relevant Medications   metFORMIN (GLUCOPHAGE) 500 MG tablet   Other Relevant Orders   Hemoglobin M0N   Basic metabolic panel   Microalbumin / creatinine urine ratio   GERD (gastroesophageal reflux disease)    Symptoms controlled on protonix.        Hypercholesterolemia    On pravastatin.  Cholesterol improved.  Follow  lipid panel and liver function tests.        Relevant Orders   Lipid panel   Hepatic function panel   Hypertension - Primary    Blood pressure under good control.  Continue same medication regimen.  Follow pressures.  Follow metabolic panel.        Relevant Orders   TSH   Obesity (BMI 30-39.9)    Discussed diet and exercise.        Relevant Medications   metFORMIN (GLUCOPHAGE) 500 MG tablet   Obstructive sleep apnea    Discussed cpap.  She is using.  Not nightly.  Needs ambien when she uses the mask.  Discussed the need to wear the mask nightly.  Follow.           Einar Pheasant, MD

## 2015-09-11 NOTE — Progress Notes (Signed)
Pre-visit discussion using our clinic review tool. No additional management support is needed unless otherwise documented below in the visit note.  

## 2015-09-12 ENCOUNTER — Encounter: Payer: Self-pay | Admitting: Internal Medicine

## 2015-09-12 NOTE — Assessment & Plan Note (Signed)
Discussed diet and exercise 

## 2015-09-12 NOTE — Assessment & Plan Note (Signed)
Blood pressure under good control.  Continue same medication regimen.  Follow pressures.  Follow metabolic panel.   

## 2015-09-12 NOTE — Assessment & Plan Note (Signed)
On pravastatin.  Cholesterol improved.  Follow lipid panel and liver function tests.   

## 2015-09-12 NOTE — Assessment & Plan Note (Signed)
Low carb diet and exercise.  Follow met b and a1c.  a1c increased on recent check.  Start metformin as directed.  Follow for low sugars.  Discussed diet, exercise and weight loss.

## 2015-09-12 NOTE — Assessment & Plan Note (Signed)
Symptoms controlled on protonix.   

## 2015-09-12 NOTE — Assessment & Plan Note (Signed)
Discussed cpap.  She is using.  Not nightly.  Needs ambien when she uses the mask.  Discussed the need to wear the mask nightly.  Follow.

## 2015-09-12 NOTE — Assessment & Plan Note (Signed)
Colonoscopy 11/28/14 - as outlined.  Recommended f/u colonoscopy in 5 years.  Follow cbc and ferritin.

## 2015-09-17 ENCOUNTER — Encounter: Payer: Self-pay | Admitting: Internal Medicine

## 2015-10-07 ENCOUNTER — Other Ambulatory Visit: Payer: Self-pay | Admitting: Internal Medicine

## 2015-10-09 NOTE — Telephone Encounter (Signed)
Request received for Ambien Last Refill 08/25/2015

## 2015-10-09 NOTE — Telephone Encounter (Signed)
ok'd refill for ambien #30 with one refill.  

## 2015-10-09 NOTE — Telephone Encounter (Signed)
Request received for Ambien- Last refill 08/25/2015 with one additional refill--Last OV 09/11/2015

## 2015-10-15 ENCOUNTER — Other Ambulatory Visit: Payer: Self-pay

## 2015-10-15 MED ORDER — METFORMIN HCL 500 MG PO TABS: 500.0000 mg | ORAL_TABLET | Freq: Every day | ORAL | Status: DC

## 2015-10-20 ENCOUNTER — Ambulatory Visit: Payer: BC Managed Care – PPO

## 2015-10-20 ENCOUNTER — Ambulatory Visit (INDEPENDENT_AMBULATORY_CARE_PROVIDER_SITE_OTHER): Payer: BC Managed Care – PPO

## 2015-10-20 DIAGNOSIS — Z302 Encounter for sterilization: Secondary | ICD-10-CM

## 2015-10-20 MED ORDER — MEDROXYPROGESTERONE ACETATE 150 MG/ML IM SUSP
150.0000 mg | Freq: Once | INTRAMUSCULAR | Status: AC
  Administered 2015-10-20: 150 mg via INTRAMUSCULAR

## 2015-10-20 NOTE — Progress Notes (Signed)
Patient was in receiving a Depo-Provera injection in the right deltoid. Patient tolerated well.

## 2015-11-04 ENCOUNTER — Encounter: Payer: Self-pay | Admitting: Internal Medicine

## 2015-11-04 ENCOUNTER — Other Ambulatory Visit: Payer: Self-pay | Admitting: Internal Medicine

## 2015-11-04 MED ORDER — FLUTICASONE PROPIONATE 50 MCG/ACT NA SUSP: 2.0000 | Freq: Every day | NASAL | Status: DC

## 2015-11-04 NOTE — Telephone Encounter (Signed)
rx sent in for flonase with 2 refills.

## 2015-12-09 ENCOUNTER — Other Ambulatory Visit (INDEPENDENT_AMBULATORY_CARE_PROVIDER_SITE_OTHER): Payer: BC Managed Care – PPO

## 2015-12-09 DIAGNOSIS — I10 Essential (primary) hypertension: Secondary | ICD-10-CM | POA: Diagnosis not present

## 2015-12-09 DIAGNOSIS — E119 Type 2 diabetes mellitus without complications: Secondary | ICD-10-CM | POA: Diagnosis not present

## 2015-12-09 DIAGNOSIS — D649 Anemia, unspecified: Secondary | ICD-10-CM

## 2015-12-09 DIAGNOSIS — E7800 Pure hypercholesterolemia, unspecified: Secondary | ICD-10-CM

## 2015-12-09 LAB — CBC WITH DIFFERENTIAL/PLATELET
Basophils Absolute: 0 10*3/uL (ref 0.0–0.1)
Basophils Relative: 0.6 % (ref 0.0–3.0)
EOS PCT: 2.6 % (ref 0.0–5.0)
Eosinophils Absolute: 0.2 10*3/uL (ref 0.0–0.7)
HCT: 40.5 % (ref 36.0–46.0)
Hemoglobin: 13.7 g/dL (ref 12.0–15.0)
LYMPHS ABS: 1.8 10*3/uL (ref 0.7–4.0)
Lymphocytes Relative: 29.2 % (ref 12.0–46.0)
MCHC: 33.8 g/dL (ref 30.0–36.0)
MCV: 88.8 fl (ref 78.0–100.0)
MONOS PCT: 7 % (ref 3.0–12.0)
Monocytes Absolute: 0.4 10*3/uL (ref 0.1–1.0)
NEUTROS ABS: 3.8 10*3/uL (ref 1.4–7.7)
NEUTROS PCT: 60.6 % (ref 43.0–77.0)
PLATELETS: 210 10*3/uL (ref 150.0–400.0)
RBC: 4.56 Mil/uL (ref 3.87–5.11)
RDW: 13.1 % (ref 11.5–15.5)
WBC: 6.3 10*3/uL (ref 4.0–10.5)

## 2015-12-09 LAB — LDL CHOLESTEROL, DIRECT: Direct LDL: 122 mg/dL

## 2015-12-09 LAB — HEPATIC FUNCTION PANEL
ALK PHOS: 69 U/L (ref 39–117)
ALT: 33 U/L (ref 0–35)
AST: 26 U/L (ref 0–37)
Albumin: 3.9 g/dL (ref 3.5–5.2)
BILIRUBIN TOTAL: 0.4 mg/dL (ref 0.2–1.2)
Bilirubin, Direct: 0 mg/dL (ref 0.0–0.3)
Total Protein: 6.6 g/dL (ref 6.0–8.3)

## 2015-12-09 LAB — LIPID PANEL
CHOLESTEROL: 183 mg/dL (ref 0–200)
HDL: 40 mg/dL (ref >39.00)
NonHDL: 142.75
Total CHOL/HDL Ratio: 5
Triglycerides: 215 mg/dL — ABNORMAL HIGH (ref 0.0–149.0)
VLDL: 43 mg/dL — ABNORMAL HIGH (ref 0.0–40.0)

## 2015-12-09 LAB — BASIC METABOLIC PANEL
BUN: 12 mg/dL (ref 6–23)
CALCIUM: 8.9 mg/dL (ref 8.4–10.5)
CHLORIDE: 107 meq/L (ref 96–112)
CO2: 25 meq/L (ref 19–32)
CREATININE: 0.81 mg/dL (ref 0.40–1.20)
GFR: 80.23 mL/min (ref >60.00)
GLUCOSE: 169 mg/dL — AB (ref 70–99)
Potassium: 4.1 mEq/L (ref 3.5–5.1)
Sodium: 139 mEq/L (ref 135–145)

## 2015-12-09 LAB — MICROALBUMIN / CREATININE URINE RATIO
Creatinine,U: 226 mg/dL
Microalb Creat Ratio: 0.8 mg/g (ref 0.0–30.0)
Microalb, Ur: 1.9 mg/dL (ref 0.0–1.9)

## 2015-12-09 LAB — HEMOGLOBIN A1C: Hgb A1c MFr Bld: 7.6 % — ABNORMAL HIGH (ref 4.6–6.5)

## 2015-12-09 LAB — TSH: TSH: 1.21 u[IU]/mL (ref 0.35–4.50)

## 2015-12-09 LAB — FERRITIN: FERRITIN: 29.1 ng/mL (ref 10.0–291.0)

## 2015-12-10 ENCOUNTER — Encounter: Payer: Self-pay | Admitting: Internal Medicine

## 2015-12-11 ENCOUNTER — Encounter: Payer: Self-pay | Admitting: Internal Medicine

## 2015-12-11 ENCOUNTER — Ambulatory Visit (INDEPENDENT_AMBULATORY_CARE_PROVIDER_SITE_OTHER): Payer: BC Managed Care – PPO | Admitting: Internal Medicine

## 2015-12-11 VITALS — BP 110/70 | HR 90 | Temp 98.9°F | Resp 18 | Ht 63.5 in | Wt 204.5 lb

## 2015-12-11 DIAGNOSIS — D649 Anemia, unspecified: Secondary | ICD-10-CM

## 2015-12-11 DIAGNOSIS — E119 Type 2 diabetes mellitus without complications: Secondary | ICD-10-CM

## 2015-12-11 DIAGNOSIS — I10 Essential (primary) hypertension: Secondary | ICD-10-CM

## 2015-12-11 DIAGNOSIS — E7800 Pure hypercholesterolemia, unspecified: Secondary | ICD-10-CM | POA: Diagnosis not present

## 2015-12-11 DIAGNOSIS — E669 Obesity, unspecified: Secondary | ICD-10-CM

## 2015-12-11 MED ORDER — METFORMIN HCL ER 500 MG PO TB24: 500.0000 mg | ORAL_TABLET | Freq: Two times a day (BID) | ORAL | Status: DC

## 2015-12-11 NOTE — Progress Notes (Signed)
Pre-visit discussion using our clinic review tool. No additional management support is needed unless otherwise documented below in the visit note.  

## 2015-12-11 NOTE — Progress Notes (Signed)
Patient ID: Anne [name], female   DOB: 15-Jan-1966, 50 y.o.   MRN: [id number]   Subjective:    Patient ID: Anne [name], female    DOB: 1965/07/03, 50 y.o.   MRN: [id number]  HPI  Patient here for a scheduled follow up.  We discussed her recent lab results.  Recent a1c 7.6.  Discussed diet changes and exercises.  Discussed changing her medications.  She has had some bowel issues with short acting metformin.  Discussed changing to extended release metformin.  No chest pain.  No sob.  No abdominal pain.  LDL improved.  On pravastatin.  Discussed weight.  Discussed depo.    Past Medical History  Diagnosis Date  . Hypertension   . Diabetes mellitus ([place])   . Hypercholesterolemia   . Anemia   . History of abnormal Pap smear     s/p LEEP, h/o HPV  . IBS (irritable bowel syndrome)   . Gastritis    Past Surgical History  Procedure Laterality Date  . Appendectomy    . Colonoscopy with propofol N/A 11/28/2014    Procedure: COLONOSCOPY WITH PROPOFOL;  Surgeon: Lollie Sails, MD;  Location: [facility] ENDOSCOPY;  Service: Endoscopy;  Laterality: N/A;   Family History  Problem Relation Age of Onset  . Diabetes Paternal Grandmother   . Emphysema Paternal Grandfather   . Colon cancer Neg Hx   . Breast cancer Sister 11   Social History   Social History  . Marital Status: Married    Spouse Name: N/A  . Number of Children: 0  . Years of Education: N/A   Social History Main Topics  . Smoking status: Never Smoker   . Smokeless tobacco: Never Used  . Alcohol Use: No  . Drug Use: No  . Sexual Activity: Not Asked   Other Topics Concern  . None   Social History Narrative    Outpatient Encounter Prescriptions as of 12/11/2015  Medication Sig  . BIOTIN PO Take by mouth.  . fish oil-omega-3 fatty acids 1000 MG capsule Take 1 g by mouth daily.  Marland Kitchen FLUoxetine (PROZAC) 10 MG capsule TAKE 2 CAPSULES BY MOUTH EVERY DAY  . fluticasone (FLONASE) 50 MCG/ACT nasal spray PLACE 2 SPRAYS  INTO THE NOSE DAILY.  Marland Kitchen losartan (COZAAR) 25 MG tablet TAKE 1 TABLET BY MOUTH DAILY  . medroxyPROGESTERone (DEPO-PROVERA) 150 MG/ML injection INJECT 1 ML INTRAMUSCULARLY EVERY THREE MONTHS  . Multiple Vitamin (MULTIVITAMIN) tablet Take 1 tablet by mouth daily.  . ONE TOUCH ULTRA TEST test strip TEST BLOOD SUGAR DAILY  . pantoprazole (PROTONIX) 40 MG tablet Take 1 tablet (40 mg total) by mouth daily.  . pravastatin (PRAVACHOL) 10 MG tablet TAKE 1 TABLET (10 MG TOTAL) BY MOUTH DAILY.  . ranitidine (ZANTAC) 150 MG capsule Take 150 mg by mouth every evening.  . zolpidem (AMBIEN) 5 MG tablet TAKE 1 TABLET BY MOUTH AT BEDTIME AS NEEDED FOR SLEEP  . [DISCONTINUED] metFORMIN (GLUCOPHAGE) 500 MG tablet Take 1 tablet (500 mg total) by mouth daily.  . metFORMIN (GLUCOPHAGE XR) 500 MG 24 hr tablet Take 1 tablet (500 mg total) by mouth 2 (two) times daily.   No facility-administered encounter medications on file as of 12/11/2015.    Review of Systems  Constitutional: Negative for appetite change and unexpected weight change.  HENT: Negative for congestion and sinus pressure.   Respiratory: Negative for cough, chest tightness and shortness of breath.   Cardiovascular: Negative for chest pain, palpitations and leg swelling.  Gastrointestinal: Negative for nausea, vomiting, abdominal pain and diarrhea.  Genitourinary: Negative for dysuria and difficulty urinating.  Musculoskeletal: Negative for back pain and joint swelling.  Skin: Negative for color change and rash.  Neurological: Negative for dizziness, light-headedness and headaches.  Psychiatric/Behavioral: Negative for dysphoric mood and agitation.       Objective:     Blood pressure rechecked by me:  122/74  Physical Exam  Constitutional: She appears well-developed and well-nourished. No distress.  HENT:  Nose: Nose normal.  Mouth/Throat: Oropharynx is clear and moist.  Neck: Neck supple. No thyromegaly present.  Cardiovascular: Normal rate  and regular rhythm.   Pulmonary/Chest: Breath sounds normal. No respiratory distress. She has no wheezes.  Abdominal: Soft. Bowel sounds are normal. There is no tenderness.  Musculoskeletal: She exhibits no edema or tenderness.  Lymphadenopathy:    She has no cervical adenopathy.  Skin: No rash noted. No erythema.  Psychiatric: She has a normal mood and affect. Her behavior is normal.    BP 110/70 mmHg  Pulse 90  Temp(Src) 98.9 F (37.2 C) (Oral)  Resp 18  Ht 5' 3.5" (1.613 m)  Wt 204 lb 8 oz (92.761 kg)  BMI 35.65 kg/m2  SpO2 97% Wt Readings from Last 3 Encounters:  12/11/15 204 lb 8 oz (92.761 kg)  09/11/15 206 lb 6 oz (93.611 kg)  05/11/15 198 lb 12 oz (90.152 kg)     Lab Results  Component Value Date   WBC 6.3 12/09/2015   HGB 13.7 12/09/2015   HCT 40.5 12/09/2015   PLT 210.0 12/09/2015   GLUCOSE 169* 12/09/2015   CHOL 183 12/09/2015   TRIG 215.0* 12/09/2015   HDL 40.00 12/09/2015   LDLDIRECT 122.0 12/09/2015   LDLCALC 124* 09/09/2015   ALT 33 12/09/2015   AST 26 12/09/2015   NA 139 12/09/2015   K 4.1 12/09/2015   CL 107 12/09/2015   CREATININE 0.81 12/09/2015   BUN 12 12/09/2015   CO2 25 12/09/2015   TSH 1.21 12/09/2015   HGBA1C 7.6* 12/09/2015   MICROALBUR 1.9 12/09/2015    Mm Digital Screening Bilateral  08/07/2015  CLINICAL DATA:  Screening. EXAM: DIGITAL SCREENING BILATERAL MAMMOGRAM WITH CAD COMPARISON:  Previous exam(s). ACR Breast Density Category b: There are scattered areas of fibroglandular density. FINDINGS: There are no findings suspicious for malignancy. Images were processed with CAD. IMPRESSION: No mammographic evidence of malignancy. A result letter of this screening mammogram will be mailed directly to the patient. RECOMMENDATION: Screening mammogram in one year. (Code:SM-B-01Y) BI-RADS CATEGORY  1: Negative. Electronically Signed   By: Margarette Canada M.D.   On: 08/07/2015 09:08       Assessment & Plan:   Problem List Items Addressed This  Visit    Anemia    Most recent hgb wnl.        Relevant Orders   Ferritin   CBC with Differential/Platelet   Diabetes mellitus ([place]) - Primary    Low carb diet and exercise.  Discussed weight loss.  Follow met b and a1c.  Change to extended release metformin and change to bid.  Follow.        Relevant Medications   metFORMIN (GLUCOPHAGE XR) 500 MG 24 hr tablet   Other Relevant Orders   Hemoglobin P2R   Basic metabolic panel   Hypercholesterolemia    Discussed diet and exercise.  On pravastatin.  LDL improved.  Follow.  May need to increase dose.        Relevant Orders  Lipid panel   Hepatic function panel   Hypertension    Blood pressure under good control.  Continue same medication regimen.  Follow pressures.  Follow metabolic panel.        Obesity (BMI 30-39.9)    Discussed diet and exercise.  Follow.       Relevant Medications   metFORMIN (GLUCOPHAGE XR) 500 MG 24 hr tablet     I spent 25 minutes with the patient and more than 50% of the time was spent in consultation regarding the above.     Einar Pheasant, MD

## 2015-12-13 ENCOUNTER — Encounter: Payer: Self-pay | Admitting: Internal Medicine

## 2015-12-13 NOTE — Assessment & Plan Note (Signed)
Discussed diet and exercise.  On pravastatin.  LDL improved.  Follow.  May need to increase dose.

## 2015-12-13 NOTE — Assessment & Plan Note (Signed)
Low carb diet and exercise.  Discussed weight loss.  Follow met b and a1c.  Change to extended release metformin and change to bid.  Follow.

## 2015-12-13 NOTE — Assessment & Plan Note (Signed)
Discussed diet and exercise.  Follow.  

## 2015-12-13 NOTE — Assessment & Plan Note (Signed)
Blood pressure under good control.  Continue same medication regimen.  Follow pressures.  Follow metabolic panel.   

## 2015-12-13 NOTE — Assessment & Plan Note (Signed)
Most recent hgb wnl.

## 2015-12-24 ENCOUNTER — Encounter: Payer: Self-pay | Admitting: Internal Medicine

## 2016-01-03 ENCOUNTER — Other Ambulatory Visit: Payer: Self-pay | Admitting: Internal Medicine

## 2016-01-04 ENCOUNTER — Encounter: Payer: Self-pay | Admitting: Internal Medicine

## 2016-01-04 NOTE — Telephone Encounter (Signed)
Rx faxed to [facility] [place] 

## 2016-01-04 NOTE — Telephone Encounter (Signed)
ok'd refill for ambien #30 with no refills.

## 2016-01-05 ENCOUNTER — Ambulatory Visit: Payer: BC Managed Care – PPO

## 2016-01-05 ENCOUNTER — Ambulatory Visit (INDEPENDENT_AMBULATORY_CARE_PROVIDER_SITE_OTHER): Payer: BC Managed Care – PPO

## 2016-01-05 DIAGNOSIS — Z30013 Encounter for initial prescription of injectable contraceptive: Secondary | ICD-10-CM | POA: Diagnosis not present

## 2016-01-05 MED ORDER — MEDROXYPROGESTERONE ACETATE 150 MG/ML IM SUSP
150.0000 mg | Freq: Once | INTRAMUSCULAR | Status: AC
  Administered 2016-01-05: 150 mg via INTRAMUSCULAR

## 2016-01-05 NOTE — Progress Notes (Signed)
Patient came in for a Depo shot.  Received in Left deltoid.  Patient tolerated well.

## 2016-01-09 ENCOUNTER — Other Ambulatory Visit: Payer: Self-pay | Admitting: Internal Medicine

## 2016-01-18 ENCOUNTER — Encounter: Payer: Self-pay | Admitting: Internal Medicine

## 2016-02-09 ENCOUNTER — Other Ambulatory Visit: Payer: Self-pay | Admitting: Internal Medicine

## 2016-02-09 NOTE — Telephone Encounter (Signed)
Last filled 01/07/16. Last seen 12/11/2015.

## 2016-02-10 ENCOUNTER — Other Ambulatory Visit: Payer: Self-pay | Admitting: *Deleted

## 2016-02-10 LAB — HM DIABETES EYE EXAM

## 2016-02-10 MED ORDER — METFORMIN HCL ER 500 MG PO TB24: 500.0000 mg | ORAL_TABLET | Freq: Two times a day (BID) | ORAL | 0 refills | Status: DC

## 2016-02-10 NOTE — Telephone Encounter (Signed)
Rx faxed to [facility] [place]  

## 2016-02-17 ENCOUNTER — Encounter: Payer: Self-pay | Admitting: Internal Medicine

## 2016-03-03 ENCOUNTER — Telehealth: Payer: Self-pay | Admitting: Internal Medicine

## 2016-03-03 MED ORDER — ACYCLOVIR 400 MG PO TABS: 400.0000 mg | ORAL_TABLET | Freq: Three times a day (TID) | ORAL | 0 refills | Status: DC

## 2016-03-03 NOTE — Telephone Encounter (Signed)
faxed

## 2016-03-03 NOTE — Telephone Encounter (Signed)
Pt called about needing a Rx for a vaginal break out that she's having. Pt stated that if she ever have a break out to call and Dr Lorin PicketScott will call in something. Please advise?  Ph[facility]acy is [id number] - [place], [place] - [address]  Call pt @ [phone]. Thank you!

## 2016-03-03 NOTE — Telephone Encounter (Signed)
I have sent in the prescription for the medication.  I assume this is similar to previous flares.  Please confirm with pt.  Let us know if persistent problems.

## 2016-03-14 ENCOUNTER — Other Ambulatory Visit: Payer: Self-pay | Admitting: Internal Medicine

## 2016-03-15 NOTE — Telephone Encounter (Signed)
Last filled 02/10/16. Last OV 12/11/15. Has follow up visit on 04/27/16.

## 2016-03-22 ENCOUNTER — Ambulatory Visit (INDEPENDENT_AMBULATORY_CARE_PROVIDER_SITE_OTHER): Payer: BC Managed Care – PPO

## 2016-03-22 DIAGNOSIS — Z308 Encounter for other contraceptive management: Secondary | ICD-10-CM | POA: Diagnosis not present

## 2016-03-22 MED ORDER — MEDROXYPROGESTERONE ACETATE 150 MG/ML IM SUSP
150.0000 mg | Freq: Once | INTRAMUSCULAR | Status: AC
  Administered 2016-03-22: 150 mg via INTRAMUSCULAR

## 2016-03-22 NOTE — Progress Notes (Signed)
Pt was in today receiving a depo shot. Received in right deltoid. Pt tolerated well.

## 2016-04-08 ENCOUNTER — Other Ambulatory Visit: Payer: Self-pay | Admitting: Internal Medicine

## 2016-04-10 ENCOUNTER — Other Ambulatory Visit: Payer: Self-pay | Admitting: Internal Medicine

## 2016-04-11 NOTE — Telephone Encounter (Signed)
last filled 03/15/16 30 0rf

## 2016-04-18 ENCOUNTER — Other Ambulatory Visit (INDEPENDENT_AMBULATORY_CARE_PROVIDER_SITE_OTHER): Payer: BC Managed Care – PPO

## 2016-04-18 DIAGNOSIS — E7800 Pure hypercholesterolemia, unspecified: Secondary | ICD-10-CM

## 2016-04-18 DIAGNOSIS — E119 Type 2 diabetes mellitus without complications: Secondary | ICD-10-CM

## 2016-04-18 DIAGNOSIS — D649 Anemia, unspecified: Secondary | ICD-10-CM | POA: Diagnosis not present

## 2016-04-18 DIAGNOSIS — R7989 Other specified abnormal findings of blood chemistry: Secondary | ICD-10-CM | POA: Diagnosis not present

## 2016-04-18 LAB — CBC WITH DIFFERENTIAL/PLATELET
BASOS ABS: 0 10*3/uL (ref 0.0–0.1)
Basophils Relative: 0.7 % (ref 0.0–3.0)
EOS ABS: 0.2 10*3/uL (ref 0.0–0.7)
Eosinophils Relative: 3.7 % (ref 0.0–5.0)
HCT: 43.4 % (ref 36.0–46.0)
HEMOGLOBIN: 14.8 g/dL (ref 12.0–15.0)
Lymphocytes Relative: 35.1 % (ref 12.0–46.0)
Lymphs Abs: 1.9 10*3/uL (ref 0.7–4.0)
MCHC: 34 g/dL (ref 30.0–36.0)
MCV: 88.8 fl (ref 78.0–100.0)
MONO ABS: 0.4 10*3/uL (ref 0.1–1.0)
Monocytes Relative: 7.1 % (ref 3.0–12.0)
Neutro Abs: 3 10*3/uL (ref 1.4–7.7)
Neutrophils Relative %: 53.4 % (ref 43.0–77.0)
Platelets: 230 10*3/uL (ref 150.0–400.0)
RBC: 4.89 Mil/uL (ref 3.87–5.11)
RDW: 13.4 % (ref 11.5–15.5)
WBC: 5.6 10*3/uL (ref 4.0–10.5)

## 2016-04-18 LAB — BASIC METABOLIC PANEL
BUN: 17 mg/dL (ref 6–23)
CALCIUM: 9.4 mg/dL (ref 8.4–10.5)
CO2: 26 mEq/L (ref 19–32)
CREATININE: 0.87 mg/dL (ref 0.40–1.20)
Chloride: 104 mEq/L (ref 96–112)
GFR: 73.77 mL/min (ref >60.00)
Glucose, Bld: 180 mg/dL — ABNORMAL HIGH (ref 70–99)
Potassium: 4.2 mEq/L (ref 3.5–5.1)
Sodium: 138 mEq/L (ref 135–145)

## 2016-04-18 LAB — FERRITIN: FERRITIN: 51.4 ng/mL (ref 10.0–291.0)

## 2016-04-18 LAB — HEPATIC FUNCTION PANEL
ALBUMIN: 4.2 g/dL (ref 3.5–5.2)
ALT: 46 U/L — AB (ref 0–35)
AST: 50 U/L — AB (ref 0–37)
Alkaline Phosphatase: 69 U/L (ref 39–117)
Bilirubin, Direct: 0.1 mg/dL (ref 0.0–0.3)
Total Bilirubin: 0.4 mg/dL (ref 0.2–1.2)
Total Protein: 7.4 g/dL (ref 6.0–8.3)

## 2016-04-18 LAB — LIPID PANEL
Cholesterol: 221 mg/dL — ABNORMAL HIGH (ref 0–200)
HDL: 43.8 mg/dL (ref >39.00)
NONHDL: 177.38
TRIGLYCERIDES: 242 mg/dL — AB (ref 0.0–149.0)
Total CHOL/HDL Ratio: 5
VLDL: 48.4 mg/dL — AB (ref 0.0–40.0)

## 2016-04-18 LAB — HEMOGLOBIN A1C: HEMOGLOBIN A1C: 8 % — AB (ref 4.6–6.5)

## 2016-04-18 LAB — LDL CHOLESTEROL, DIRECT: Direct LDL: 163 mg/dL

## 2016-04-19 ENCOUNTER — Encounter: Payer: Self-pay | Admitting: Internal Medicine

## 2016-04-27 ENCOUNTER — Ambulatory Visit (INDEPENDENT_AMBULATORY_CARE_PROVIDER_SITE_OTHER): Payer: BC Managed Care – PPO | Admitting: Internal Medicine

## 2016-04-27 ENCOUNTER — Encounter: Payer: Self-pay | Admitting: Internal Medicine

## 2016-04-27 VITALS — BP 114/80 | HR 83 | Temp 98.9°F | Ht 64.0 in | Wt 202.2 lb

## 2016-04-27 DIAGNOSIS — E669 Obesity, unspecified: Secondary | ICD-10-CM

## 2016-04-27 DIAGNOSIS — F439 Reaction to severe stress, unspecified: Secondary | ICD-10-CM | POA: Diagnosis not present

## 2016-04-27 DIAGNOSIS — E7800 Pure hypercholesterolemia, unspecified: Secondary | ICD-10-CM

## 2016-04-27 DIAGNOSIS — R945 Abnormal results of liver function studies: Secondary | ICD-10-CM

## 2016-04-27 DIAGNOSIS — I10 Essential (primary) hypertension: Secondary | ICD-10-CM

## 2016-04-27 DIAGNOSIS — K219 Gastro-esophageal reflux disease without esophagitis: Secondary | ICD-10-CM

## 2016-04-27 DIAGNOSIS — D649 Anemia, unspecified: Secondary | ICD-10-CM

## 2016-04-27 DIAGNOSIS — R7989 Other specified abnormal findings of blood chemistry: Secondary | ICD-10-CM

## 2016-04-27 DIAGNOSIS — G4733 Obstructive sleep apnea (adult) (pediatric): Secondary | ICD-10-CM | POA: Diagnosis not present

## 2016-04-27 DIAGNOSIS — E119 Type 2 diabetes mellitus without complications: Secondary | ICD-10-CM

## 2016-04-27 LAB — HEPATIC FUNCTION PANEL
ALBUMIN: 4.4 g/dL (ref 3.5–5.2)
ALT: 41 U/L — ABNORMAL HIGH (ref 0–35)
AST: 31 U/L (ref 0–37)
Alkaline Phosphatase: 67 U/L (ref 39–117)
BILIRUBIN TOTAL: 0.3 mg/dL (ref 0.2–1.2)
Bilirubin, Direct: 0.1 mg/dL (ref 0.0–0.3)
Total Protein: 7.4 g/dL (ref 6.0–8.3)

## 2016-04-27 MED ORDER — PRAVASTATIN SODIUM 20 MG PO TABS: 20.0000 mg | ORAL_TABLET | Freq: Every day | ORAL | 1 refills | Status: DC

## 2016-04-27 MED ORDER — METFORMIN HCL ER 500 MG PO TB24: ORAL_TABLET | ORAL | 1 refills | Status: DC

## 2016-04-27 NOTE — Progress Notes (Signed)
Pre visit review using our clinic review tool, if applicable. No additional management support is needed unless otherwise documented below in the visit note. 

## 2016-04-27 NOTE — Progress Notes (Signed)
Patient ID: Anne [name], female   DOB: July 16, 1965, 50 y.o.   MRN: [id number]   Subjective:    Patient ID: Anne [name], female    DOB: July 16, 1965, 50 y.o.   MRN: [id number]  HPI  Patient here for a scheduled follow up.  She has not been exercising.  Not watching her diet.  States she started getting more serious when she got her lab results recently.  She has not been taking her medication regularly.  Misses doses.  Discussed lab results.  a1c on recent check 8.0.  Again not taking her medication.  Discussed adding medication.  She prefers to try diet and exercise and just taking metformin and seeing if she can get her numbers down.  Cholesterol elevated as well.  Increased liver function tests.  Discussed fatty liver and the need for diet and weight loss.  No chest pain.  No sob.  No nausea or vomiting.  Bowels stable.  Increased stress.  Discussed with her today.  Overall she feels she is handling things relatively well.     Past Medical History:  Diagnosis Date  . Anemia   . Diabetes mellitus ([facility])   . Gastritis   . History of abnormal Pap smear    s/p LEEP, h/o HPV  . Hypercholesterolemia   . Hypertension   . IBS (irritable bowel syndrome)    Past Surgical History:  Procedure Laterality Date  . APPENDECTOMY    . COLONOSCOPY WITH PROPOFOL N/A 11/28/2014   Procedure: COLONOSCOPY WITH PROPOFOL;  Surgeon: Christena DeemMartin U Skulskie, MD;  Location[facility] END[facility]COPY;  Service: Endoscopy;  Laterality: N/A;   Family History  Problem Relation Age of Onset  . Diabetes Paternal Grandmother   . Emphysema Paternal Grandfather   . Breast cancer Sister [id number]  . Colon cancer Neg Hx    Social History   Social History  . Marital status: Married    Spouse name: N/A  . Number of children: 0  . Years of education: N/A   Social History Main Topics  . Smoking status: Never Smoker  . Smokeless tobacco: Never Used  . Alcohol use No  . Drug use: No  . Sexual activity: Not Asked   Other  Topics Concern  . None   Social History Narrative  . None    Outpatient Encounter Prescriptions as of 04/27/2016  Medication Sig  . acyclovir (ZOVIRAX) 400 MG tablet Take 1 tablet (400 mg total) by mouth 3 (three) times daily.  Marland Kitchen. BIOTIN PO Take by mouth.  . fish oil-omega-3 fatty acids 1000 MG capsule Take 1 g by mouth daily.  Marland Kitchen. FLUoxetine (PROZAC) 10 MG capsule TAKE 2 CAPSULES BY MOUTH EVERY DAY  . fluticasone (FLONASE) 50 MCG/ACT nasal spray PLACE 2 SPRAYS INTO THE NOSE DAILY.  Marland Kitchen. losartan (COZAAR) 25 MG tablet TAKE 1 TABLET BY MOUTH DAILY  . medroxyPROGESTERone (DEPO-PROVERA) 150 MG/ML injection INJECT 1 ML INTRAMUSCULARLY EVERY THREE MONTHS  . metFORMIN (GLUCOPHAGE XR) 500 MG 24 hr tablet Take 2 tablets bid  . Multiple Vitamin (MULTIVITAMIN) tablet Take 1 tablet by mouth daily.  . ONE TOUCH ULTRA TEST test strip TEST BLOOD SUGAR DAILY  . pantoprazole (PROTONIX) 40 MG tablet TAKE 1 TABLET (40 MG TOTAL) BY MOUTH DAILY.  . ranitidine (ZANTAC) 150 MG capsule Take 150 mg by mouth every evening.  . zolpidem (AMBIEN) 5 MG tablet TAKE 1 TABLET BY MOUTH AT BEDTIME AS NEEDED FOR SLEEP  . [DISCONTINUED] metFORMIN (GLUCOPHAGE XR) 500 MG 24 hr  tablet Take 1 tablet (500 mg total) by mouth 2 (two) times daily.  . [DISCONTINUED] pravastatin (PRAVACHOL) 10 MG tablet TAKE 1 TABLET (10 MG TOTAL) BY MOUTH DAILY.  . pravastatin (PRAVACHOL) 20 MG tablet Take 1 tablet (20 mg total) by mouth daily.   No facility-administered encounter medications on file as of 04/27/2016.     Review of Systems  Constitutional: Negative for appetite change and unexpected weight change.  HENT: Negative for congestion and sinus pressure.   Respiratory: Negative for cough, chest tightness and shortness of breath.   Cardiovascular: Negative for chest pain, palpitations and leg swelling.  Gastrointestinal: Negative for abdominal pain, diarrhea, nausea and vomiting.  Genitourinary: Negative for difficulty urinating and  dysuria.  Musculoskeletal: Negative for back pain and joint swelling.  Skin: Negative for color change and rash.  Neurological: Negative for dizziness, light-headedness and headaches.  Psychiatric/Behavioral: Negative for agitation and dysphoric mood.       Increased stress as outlined.         Objective:     Blood pressure rechecked by me:  120/82  Physical Exam  Constitutional: She appears well-developed and well-nourished. No distress.  HENT:  Nose: Nose normal.  Mouth/Throat: Oropharynx is clear and moist.  Neck: Neck supple. No thyromegaly present.  Cardiovascular: Normal rate and regular rhythm.   Pulmonary/Chest: Breath sounds normal. No respiratory distress. She has no wheezes.  Abdominal: Soft. Bowel sounds are normal. There is no tenderness.  Musculoskeletal: She exhibits no edema or tenderness.  Lymphadenopathy:    She has no cervical adenopathy.  Skin: No rash noted. No erythema.  Psychiatric: She has a normal mood and affect. Her behavior is normal.    BP 114/80   Pulse 83   Temp 98.9 F (37.2 C) (Oral)   Ht 5\' 4"  (1.626 m)   Wt 202 lb 3.2 oz (91.7 kg)   SpO2 98%   BMI 34.71 kg/m  Wt Readings from Last 3 Encounters:  04/27/16 202 lb 3.2 oz (91.7 kg)  12/11/15 204 lb 8 oz (92.8 kg)  09/11/15 206 lb 6 oz (93.6 kg)     Lab Results  Component Value Date   WBC 5.6 04/18/2016   HGB 14.8 04/18/2016   HCT 43.4 04/18/2016   PLT 230.0 04/18/2016   GLUCOSE 180 (H) 04/18/2016   CHOL 221 (H) 04/18/2016   TRIG 242.0 (H) 04/18/2016   HDL 43.80 04/18/2016   LDLDIRECT 163.0 04/18/2016   LDLCALC 124 (H) 09/09/2015   ALT 41 (H) 04/27/2016   AST 31 04/27/2016   NA 138 04/18/2016   K 4.2 04/18/2016   CL 104 04/18/2016   CREATININE 0.87 04/18/2016   BUN 17 04/18/2016   CO2 26 04/18/2016   TSH 1.21 12/09/2015   HGBA1C 8.0 (H) 04/18/2016   MICROALBUR 1.9 12/09/2015    Mm Digital Screening Bilateral  Result Date: 08/07/2015 CLINICAL DATA:  Screening. EXAM:  DIGITAL SCREENING BILATERAL MAMMOGRAM WITH CAD COMPARISON:  Previous exam(s). ACR Breast Density Category b: There are scattered areas of fibroglandular density. FINDINGS: There are no findings suspicious for malignancy. Images were processed with CAD. IMPRESSION: No mammographic evidence of malignancy. A result letter of this screening mammogram will be mailed directly to the patient. RECOMMENDATION: Screening mammogram in one year. (Code:SM-B-01Y) BI-RADS CATEGORY  1: Negative. Electronically Signed   By: Harmon PierJeffrey  Hu M.D.   On: 08/07/2015 09:08       Assessment & Plan:   Problem List Items Addressed This Visit  Anemia    Follow cbc.  Recent hgb wnl.        Diabetes mellitus ([facility])    a1c on recent check 8.0.  Increasing. She is not taking her medication as scheduled.  Not watching her diet.  Not exercising.  Discussed treatment options.  She prefers to adjust her diet, exercise and stick with metformin for now.  Feels she can get it down.  Will increased metformin to 1000 bid.  Follow closely.  Send in readings over the next few weeks.        Relevant Medications   metFORMIN (GLUCOPHAGE XR) 500 MG 24 hr tablet   pravastatin (PRAVACHOL) 20 MG tablet   Elevated LFTs    Discussed diet, exercise and weight loss.  Recheck liver panel.        GERD (gastroesophageal reflux disease)    Controlled on protonix.  Follow.       Hypercholesterolemia    Low cholesterol diet and exercise.  Increase pravastatin to 20mg  q day.  Follow lipid panel and liver function tests.        Relevant Medications   pravastatin (PRAVACHOL) 20 MG tablet   Hypertension    Blood pressure under good control.  Continue same medication regimen.  Follow pressures.  Follow metabolic panel.        Relevant Medications   pravastatin (PRAVACHOL) 20 MG tablet   Obesity (BMI 30-39.9)    Discussed diet and exercise.  Follow.        Relevant Medications   metFORMIN (GLUCOPHAGE XR) 500 MG 24 hr tablet   Obstructive  sleep apnea    CPAP.  Uses ambien to help her sleep.        Stress    Increased stress.  Discussed with her today. On prozac.  Does not feel needs any further intervention.  Follow.         Other Visit Diagnoses    Abnormal liver function tests    -  Primary   Relevant Orders   Hepatic function panel (Completed)       Dale [place], MD

## 2016-04-30 ENCOUNTER — Encounter: Payer: Self-pay | Admitting: Internal Medicine

## 2016-05-02 ENCOUNTER — Encounter: Payer: Self-pay | Admitting: Internal Medicine

## 2016-05-02 DIAGNOSIS — R7989 Other specified abnormal findings of blood chemistry: Secondary | ICD-10-CM | POA: Insufficient documentation

## 2016-05-02 DIAGNOSIS — R945 Abnormal results of liver function studies: Secondary | ICD-10-CM

## 2016-05-02 NOTE — Assessment & Plan Note (Signed)
Discussed diet, exercise and weight loss.  Recheck liver panel.

## 2016-05-02 NOTE — Assessment & Plan Note (Signed)
CPAP.  Uses ambien to help her sleep.

## 2016-05-02 NOTE — Assessment & Plan Note (Signed)
Blood pressure under good control.  Continue same medication regimen.  Follow pressures.  Follow metabolic panel.   

## 2016-05-02 NOTE — Assessment & Plan Note (Signed)
a1c on recent check 8.0.  Increasing. She is not taking her medication as scheduled.  Not watching her diet.  Not exercising.  Discussed treatment options.  She prefers to adjust her diet, exercise and stick with metformin for now.  Feels she can get it down.  Will increased metformin to 1000 bid.  Follow closely.  Send in readings over the next few weeks.

## 2016-05-02 NOTE — Assessment & Plan Note (Signed)
Low cholesterol diet and exercise.  Increase pravastatin to 20mg  q day.  Follow lipid panel and liver function tests.

## 2016-05-02 NOTE — Assessment & Plan Note (Signed)
Controlled on protonix.  Follow.   

## 2016-05-02 NOTE — Assessment & Plan Note (Signed)
Discussed diet and exercise.  Follow.  

## 2016-05-02 NOTE — Assessment & Plan Note (Signed)
Follow cbc.  Recent hgb wnl.  

## 2016-05-02 NOTE — Assessment & Plan Note (Signed)
Increased stress.  Discussed with her today. On prozac.  Does not feel needs any further intervention.  Follow.

## 2016-05-03 ENCOUNTER — Other Ambulatory Visit: Payer: Self-pay | Admitting: Internal Medicine

## 2016-05-03 NOTE — Telephone Encounter (Signed)
Last filled 04/11/16 30 0rf

## 2016-05-04 NOTE — Telephone Encounter (Signed)
faxed

## 2016-05-11 ENCOUNTER — Other Ambulatory Visit: Payer: Self-pay | Admitting: Internal Medicine

## 2016-05-18 ENCOUNTER — Encounter: Payer: Self-pay | Admitting: Internal Medicine

## 2016-06-12 ENCOUNTER — Other Ambulatory Visit: Payer: Self-pay | Admitting: Internal Medicine

## 2016-06-13 NOTE — Telephone Encounter (Signed)
Please advise on refill.

## 2016-06-15 ENCOUNTER — Ambulatory Visit (INDEPENDENT_AMBULATORY_CARE_PROVIDER_SITE_OTHER): Payer: BC Managed Care – PPO

## 2016-06-15 DIAGNOSIS — Z308 Encounter for other contraceptive management: Secondary | ICD-10-CM | POA: Diagnosis not present

## 2016-06-15 MED ORDER — MEDROXYPROGESTERONE ACETATE 150 MG/ML IM SUSP
150.0000 mg | Freq: Once | INTRAMUSCULAR | Status: AC
  Administered 2016-06-15: 150 mg via INTRAMUSCULAR

## 2016-06-15 NOTE — Progress Notes (Signed)
Patient comes in for Depo Provera injection.  Injected left deltoid.  Patient tolerated well.   Patient instructed on when to return for next Depo Provera injection.

## 2016-06-19 NOTE — Progress Notes (Signed)
  I have reviewed the above information and agree with above.   Clive Parcel, MD 

## 2016-07-03 ENCOUNTER — Other Ambulatory Visit: Payer: Self-pay | Admitting: Internal Medicine

## 2016-07-04 NOTE — Telephone Encounter (Signed)
[facility] filled Templeambien 06/13/2016

## 2016-07-05 ENCOUNTER — Telehealth: Payer: Self-pay | Admitting: Internal Medicine

## 2016-07-05 NOTE — Telephone Encounter (Signed)
I am ok to change her to prozac 20mg  q day.  Ok to refill #30 with 2 refills.

## 2016-07-05 NOTE — Telephone Encounter (Signed)
Alternative Requested for FLUoxetine (PROZAC) 10 MG capsule. Insurance will only cover 1 per day need new rx with higher strength.

## 2016-07-05 NOTE — Telephone Encounter (Signed)
Script has been sent by Dr. Lorin PicketScott.

## 2016-07-05 NOTE — Telephone Encounter (Signed)
Called pt she is now taking Prozac 10mg  two tabs at night. Her insurance will only approve 1 tablet a day. She would like to get new script for 20mg  1 tab at night if that is an option.

## 2016-07-05 NOTE — Telephone Encounter (Signed)
Please fax rx

## 2016-07-06 MED ORDER — FLUOXETINE HCL 20 MG PO CAPS: 20.0000 mg | ORAL_CAPSULE | Freq: Every day | ORAL | 3 refills | Status: DC

## 2016-07-18 ENCOUNTER — Other Ambulatory Visit: Payer: BC Managed Care – PPO

## 2016-07-26 ENCOUNTER — Other Ambulatory Visit: Payer: Self-pay | Admitting: Internal Medicine

## 2016-07-26 DIAGNOSIS — Z1231 Encounter for screening mammogram for malignant neoplasm of breast: Secondary | ICD-10-CM

## 2016-07-29 ENCOUNTER — Ambulatory Visit: Payer: BC Managed Care – PPO | Admitting: Internal Medicine

## 2016-08-04 ENCOUNTER — Other Ambulatory Visit: Payer: Self-pay | Admitting: Internal Medicine

## 2016-08-04 ENCOUNTER — Telehealth: Payer: Self-pay | Admitting: Radiology

## 2016-08-04 DIAGNOSIS — E119 Type 2 diabetes mellitus without complications: Secondary | ICD-10-CM

## 2016-08-04 DIAGNOSIS — D649 Anemia, unspecified: Secondary | ICD-10-CM

## 2016-08-04 DIAGNOSIS — I10 Essential (primary) hypertension: Secondary | ICD-10-CM

## 2016-08-04 DIAGNOSIS — E7800 Pure hypercholesterolemia, unspecified: Secondary | ICD-10-CM

## 2016-08-04 NOTE — Telephone Encounter (Signed)
Pt coming in for labs tomorrow, please place future orders. Thank you.  

## 2016-08-04 NOTE — Telephone Encounter (Signed)
Order placed for labs.

## 2016-08-04 NOTE — Progress Notes (Signed)
Order placed for labs.

## 2016-08-05 ENCOUNTER — Other Ambulatory Visit (INDEPENDENT_AMBULATORY_CARE_PROVIDER_SITE_OTHER): Payer: BC Managed Care – PPO

## 2016-08-05 DIAGNOSIS — E7800 Pure hypercholesterolemia, unspecified: Secondary | ICD-10-CM | POA: Diagnosis not present

## 2016-08-05 DIAGNOSIS — D649 Anemia, unspecified: Secondary | ICD-10-CM

## 2016-08-05 DIAGNOSIS — E119 Type 2 diabetes mellitus without complications: Secondary | ICD-10-CM | POA: Diagnosis not present

## 2016-08-05 LAB — CBC WITH DIFFERENTIAL/PLATELET
Basophils Absolute: 0 10*3/uL (ref 0.0–0.1)
Basophils Relative: 0.7 % (ref 0.0–3.0)
EOS PCT: 2.6 % (ref 0.0–5.0)
Eosinophils Absolute: 0.2 10*3/uL (ref 0.0–0.7)
HCT: 42.5 % (ref 36.0–46.0)
Hemoglobin: 14.2 g/dL (ref 12.0–15.0)
LYMPHS ABS: 2 10*3/uL (ref 0.7–4.0)
Lymphocytes Relative: 34.2 % (ref 12.0–46.0)
MCHC: 33.5 g/dL (ref 30.0–36.0)
MCV: 90.8 fl (ref 78.0–100.0)
MONO ABS: 0.4 10*3/uL (ref 0.1–1.0)
MONOS PCT: 7.3 % (ref 3.0–12.0)
NEUTROS ABS: 3.3 10*3/uL (ref 1.4–7.7)
Neutrophils Relative %: 55.2 % (ref 43.0–77.0)
PLATELETS: 214 10*3/uL (ref 150.0–400.0)
RBC: 4.68 Mil/uL (ref 3.87–5.11)
RDW: 13 % (ref 11.5–15.5)
WBC: 6 10*3/uL (ref 4.0–10.5)

## 2016-08-05 LAB — LIPID PANEL
CHOL/HDL RATIO: 5
Cholesterol: 187 mg/dL (ref 0–200)
HDL: 39 mg/dL — AB (ref >39.00)
NONHDL: 147.91
Triglycerides: 211 mg/dL — ABNORMAL HIGH (ref 0.0–149.0)
VLDL: 42.2 mg/dL — ABNORMAL HIGH (ref 0.0–40.0)

## 2016-08-05 LAB — HEMOGLOBIN A1C: Hgb A1c MFr Bld: 7.3 % — ABNORMAL HIGH (ref 4.6–6.5)

## 2016-08-05 LAB — BASIC METABOLIC PANEL
BUN: 16 mg/dL (ref 6–23)
CALCIUM: 9.5 mg/dL (ref 8.4–10.5)
CO2: 25 meq/L (ref 19–32)
CREATININE: 0.79 mg/dL (ref 0.40–1.20)
Chloride: 105 mEq/L (ref 96–112)
GFR: 82.35 mL/min (ref >60.00)
Glucose, Bld: 153 mg/dL — ABNORMAL HIGH (ref 70–99)
Potassium: 4.3 mEq/L (ref 3.5–5.1)
SODIUM: 138 meq/L (ref 135–145)

## 2016-08-05 LAB — HEPATIC FUNCTION PANEL
ALT: 41 U/L — AB (ref 0–35)
AST: 37 U/L (ref 0–37)
Albumin: 4.2 g/dL (ref 3.5–5.2)
Alkaline Phosphatase: 63 U/L (ref 39–117)
Bilirubin, Direct: 0.1 mg/dL (ref 0.0–0.3)
TOTAL PROTEIN: 7.1 g/dL (ref 6.0–8.3)
Total Bilirubin: 0.4 mg/dL (ref 0.2–1.2)

## 2016-08-05 LAB — LDL CHOLESTEROL, DIRECT: LDL DIRECT: 118 mg/dL

## 2016-08-05 LAB — FERRITIN: FERRITIN: 29.7 ng/mL (ref 10.0–291.0)

## 2016-08-06 ENCOUNTER — Encounter: Payer: Self-pay | Admitting: Internal Medicine

## 2016-08-08 ENCOUNTER — Ambulatory Visit
Admission: RE | Admit: 2016-08-08 | Discharge: 2016-08-08 | Disposition: A | Discharge status: Home / Self Care | Payer: BC Managed Care – PPO | Source: Ambulatory Visit | Attending: Internal Medicine | Admitting: Internal Medicine

## 2016-08-08 ENCOUNTER — Other Ambulatory Visit: Payer: Self-pay | Admitting: Internal Medicine

## 2016-08-08 DIAGNOSIS — Z1231 Encounter for screening mammogram for malignant neoplasm of breast: Secondary | ICD-10-CM

## 2016-08-12 ENCOUNTER — Ambulatory Visit (INDEPENDENT_AMBULATORY_CARE_PROVIDER_SITE_OTHER): Payer: BC Managed Care – PPO | Admitting: Internal Medicine

## 2016-08-12 ENCOUNTER — Encounter: Payer: Self-pay | Admitting: Internal Medicine

## 2016-08-12 DIAGNOSIS — I10 Essential (primary) hypertension: Secondary | ICD-10-CM

## 2016-08-12 DIAGNOSIS — E119 Type 2 diabetes mellitus without complications: Secondary | ICD-10-CM

## 2016-08-12 DIAGNOSIS — G4733 Obstructive sleep apnea (adult) (pediatric): Secondary | ICD-10-CM | POA: Diagnosis not present

## 2016-08-12 DIAGNOSIS — E669 Obesity, unspecified: Secondary | ICD-10-CM

## 2016-08-12 DIAGNOSIS — E7800 Pure hypercholesterolemia, unspecified: Secondary | ICD-10-CM

## 2016-08-12 DIAGNOSIS — F439 Reaction to severe stress, unspecified: Secondary | ICD-10-CM

## 2016-08-12 DIAGNOSIS — D649 Anemia, unspecified: Secondary | ICD-10-CM | POA: Diagnosis not present

## 2016-08-12 DIAGNOSIS — K219 Gastro-esophageal reflux disease without esophagitis: Secondary | ICD-10-CM | POA: Diagnosis not present

## 2016-08-12 DIAGNOSIS — R7989 Other specified abnormal findings of blood chemistry: Secondary | ICD-10-CM | POA: Diagnosis not present

## 2016-08-12 DIAGNOSIS — R945 Abnormal results of liver function studies: Secondary | ICD-10-CM

## 2016-08-12 MED ORDER — ZOLPIDEM TARTRATE 5 MG PO TABS: 5.0000 mg | ORAL_TABLET | Freq: Every evening | ORAL | 0 refills | Status: DC | PRN

## 2016-08-12 MED ORDER — METFORMIN HCL ER 500 MG PO TB24: ORAL_TABLET | ORAL | 1 refills | Status: DC

## 2016-08-12 NOTE — Progress Notes (Signed)
Pre-visit discussion using our clinic review tool. No additional management support is needed unless otherwise documented below in the visit note.  

## 2016-08-12 NOTE — Progress Notes (Signed)
Patient ID: Anne [name], female   DOB: 09/25/1965, 51 y.o.   MRN: [id number]   Subjective:    Patient ID: Anne [name], female    DOB: 1966/04/07, 51 y.o.   MRN: [id number]  HPI  Patient here for a scheduled follow up.  States she is doing well.  Feels better.  Has adjusted her diet.  Still trying to get in a routine with eating regular meals and appropriate snacks.  a1c has improved - 7.3.  Discussed with her.  Triglycerides and cholesterol improved.  She is not exercising yet.  Plans to start.  AM sugars averaging 150-170.  Evening sugars lower.  No chest pain.  No sob.  No acid reflux.  No abdominal pain or cramping.  Has some loose stool.  She is not sure if related to her diet or the metformin.  Wants to decrease the metformin and see if bowels improve.   Handling stress.  Discussed with her today.  Has lost some weight.     Past Medical History:  Diagnosis Date  . Anemia   . Diabetes mellitus (HCC)   . Gastritis   . History of abnormal Pap smear    s/p LEEP, h/o HPV  . Hypercholesterolemia   . Hypertension   . IBS (irritable bowel syndrome)    Past Surgical History:  Procedure Laterality Date  . APPENDECTOMY    . COLONOSCOPY WITH PROPOFOL N/A 11/28/2014   Procedure: COLONOSCOPY WITH PROPOFOL;  Surgeon: Christena Deem, MD;  Location: [facility] ENDOSCOPY;  Service: Endoscopy;  Laterality: N/A;   Family History  Problem Relation Age of Onset  . Diabetes Paternal Grandmother   . Emphysema Paternal Grandfather   . Breast cancer Sister 55  . Colon cancer Neg Hx    Social History   Social History  . Marital status: Married    Spouse name: N/A  . Number of children: 0  . Years of education: N/A   Social History Main Topics  . Smoking status: Never Smoker  . Smokeless tobacco: Never Used  . Alcohol use No  . Drug use: No  . Sexual activity: Not Asked   Other Topics Concern  . None   Social History Narrative  . None    Outpatient Encounter Prescriptions  as of 08/12/2016  Medication Sig  . acyclovir (ZOVIRAX) 400 MG tablet Take 1 tablet (400 mg total) by mouth 3 (three) times daily.  Marland Kitchen BIOTIN PO Take by mouth.  . fish oil-omega-3 fatty acids 1000 MG capsule Take 1 g by mouth daily.  Marland Kitchen FLUoxetine (PROZAC) 20 MG capsule Take 1 capsule (20 mg total) by mouth daily.  . fluticasone (FLONASE) 50 MCG/ACT nasal spray PLACE 2 SPRAYS INTO THE NOSE DAILY.  Marland Kitchen losartan (COZAAR) 25 MG tablet TAKE 1 TABLET BY MOUTH DAILY  . medroxyPROGESTERone (DEPO-PROVERA) 150 MG/ML injection INJECT 1 ML INTRAMUSCULARLY EVERY THREE MONTHS  . metFORMIN (GLUCOPHAGE XR) 500 MG 24 hr tablet Take 2 tablets bid  . Multiple Vitamin (MULTIVITAMIN) tablet Take 1 tablet by mouth daily.  . ONE TOUCH ULTRA TEST test strip TEST BLOOD SUGAR DAILY  . pantoprazole (PROTONIX) 40 MG tablet TAKE 1 TABLET (40 MG TOTAL) BY MOUTH DAILY.  . pravastatin (PRAVACHOL) 20 MG tablet Take 1 tablet (20 mg total) by mouth daily.  . ranitidine (ZANTAC) 150 MG capsule Take 150 mg by mouth every evening.  . zolpidem (AMBIEN) 5 MG tablet Take 1 tablet (5 mg total) by mouth at bedtime as needed.  . [  DISCONTINUED] metFORMIN (GLUCOPHAGE XR) 500 MG 24 hr tablet Take 2 tablets bid  . [DISCONTINUED] metFORMIN (GLUCOPHAGE-XR) 500 MG 24 hr tablet TAKE 1 TABLET (500 MG TOTAL) BY MOUTH 2 (TWO) TIMES DAILY.  . [DISCONTINUED] zolpidem (AMBIEN) 5 MG tablet TAKE 1 TABLET BY MOUTH AT BEDTIME AS NEEDED   No facility-administered encounter medications on file as of 08/12/2016.     Review of Systems  Constitutional: Negative for appetite change and unexpected weight change.  HENT: Negative for congestion and sinus pressure.   Respiratory: Negative for cough, chest tightness and shortness of breath.   Cardiovascular: Negative for chest pain, palpitations and leg swelling.  Gastrointestinal: Negative for abdominal pain, diarrhea, nausea and vomiting.  Genitourinary: Negative for difficulty urinating and dysuria.    Musculoskeletal: Negative for back pain and joint swelling.  Skin: Negative for color change and rash.  Neurological: Negative for dizziness, light-headedness and headaches.  Psychiatric/Behavioral: Negative for agitation and dysphoric mood.       Objective:     Blood pressure rechecked by me:  128/84  Physical Exam  Constitutional: She appears well-developed and well-nourished. No distress.  HENT:  Nose: Nose normal.  Mouth/Throat: Oropharynx is clear and moist.  Neck: Neck supple. No thyromegaly present.  Cardiovascular: Normal rate and regular rhythm.   Pulmonary/Chest: Breath sounds normal. No respiratory distress. She has no wheezes.  Abdominal: Soft. Bowel sounds are normal. There is no tenderness.  Musculoskeletal: She exhibits no edema or tenderness.  Lymphadenopathy:    She has no cervical adenopathy.  Skin: No rash noted. No erythema.  Psychiatric: She has a normal mood and affect. Her behavior is normal.    BP 120/76 (BP Location: Left Arm, Patient Position: Sitting, Cuff Size: Normal)   Pulse 96   Temp 98.6 F (37 C) (Oral)   Resp 16   Ht 5\' 4"  (1.626 m)   Wt 197 lb 3.2 oz (89.4 kg)   SpO2 98%   BMI 33.85 kg/m  Wt Readings from Last 3 Encounters:  08/12/16 197 lb 3.2 oz (89.4 kg)  04/27/16 202 lb 3.2 oz (91.7 kg)  12/11/15 204 lb 8 oz (92.8 kg)     Lab Results  Component Value Date   WBC 6.0 08/05/2016   HGB 14.2 08/05/2016   HCT 42.5 08/05/2016   PLT 214.0 08/05/2016   GLUCOSE 153 (H) 08/05/2016   CHOL 187 08/05/2016   TRIG 211.0 (H) 08/05/2016   HDL 39.00 (L) 08/05/2016   LDLDIRECT 118.0 08/05/2016   LDLCALC 124 (H) 09/09/2015   ALT 41 (H) 08/05/2016   AST 37 08/05/2016   NA 138 08/05/2016   K 4.3 08/05/2016   CL 105 08/05/2016   CREATININE 0.79 08/05/2016   BUN 16 08/05/2016   CO2 25 08/05/2016   TSH 1.21 12/09/2015   HGBA1C 7.3 (H) 08/05/2016   MICROALBUR 1.9 12/09/2015    Mm Screening Breast Tomo Bilateral  Result Date:  08/09/2016 CLINICAL DATA:  Screening. EXAM: 2D DIGITAL SCREENING BILATERAL MAMMOGRAM WITH CAD AND ADJUNCT TOMO COMPARISON:  Previous exam(s). ACR Breast Density Category b: There are scattered areas of fibroglandular density. FINDINGS: There are no findings suspicious for malignancy. Images were processed with CAD. IMPRESSION: No mammographic evidence of malignancy. A result letter of this screening mammogram will be mailed directly to the patient. RECOMMENDATION: Screening mammogram in one year. (Code:SM-B-01Y) BI-RADS CATEGORY  1: Negative. Electronically Signed   By: Sherian Rein M.D.   On: 08/09/2016 09:38  Assessment & Plan:   Problem List Items Addressed This Visit    Anemia    Recent hgb wnl.       Relevant Orders   CBC with Differential/Platelet   Ferritin   Diabetes mellitus (HCC)    Sugars as outlined.  A 1c improved.  She has adjusted her diet.  Plans to do more.  Plans to start exercising.  Some loose stool.  Wants to decrease dose of metformin and see if stools improve.  Will decrease to 500mg  bid.  Follow closely.  Diet and exercise.        Relevant Medications   metFORMIN (GLUCOPHAGE XR) 500 MG 24 hr tablet   Other Relevant Orders   Hemoglobin A1c   Basic metabolic panel   Elevated LFTs    Diet, exercise and weight loss.  Liver function tests stable.  Follow.        GERD (gastroesophageal reflux disease)    Controlled on protonix.        Hypercholesterolemia    Low cholesterol diet and exercise.  She has adjusted her diet.  Cholesterol improved.  On pravastatin.  Continue.  Follow lipid panel and liver function tests.        Relevant Orders   Hepatic function panel   Lipid panel   Hypertension    Blood pressure under good control.  Continue same medication regimen.  Follow pressures.  Follow metabolic panel.        Obesity (BMI 30-39.9)    Discussed diet and exercise.        Relevant Medications   metFORMIN (GLUCOPHAGE XR) 500 MG 24 hr tablet    Obstructive sleep apnea    CPAP.  Uses ambien to help her sleep.        Stress    Increased stress as outlined.  Handling things well.  Doing well on current medication.  Follow.           Dale DurhamSCOTT, Rayshun Kandler, MD

## 2016-08-13 ENCOUNTER — Encounter: Payer: Self-pay | Admitting: Internal Medicine

## 2016-08-13 NOTE — Assessment & Plan Note (Signed)
Sugars as outlined.  A 1c improved.  She has adjusted her diet.  Plans to do more.  Plans to start exercising.  Some loose stool.  Wants to decrease dose of metformin and see if stools improve.  Will decrease to 500mg  bid.  Follow closely.  Diet and exercise.

## 2016-08-13 NOTE — Assessment & Plan Note (Signed)
Diet, exercise and weight loss.  Liver function tests stable.  Follow.

## 2016-08-13 NOTE — Assessment & Plan Note (Signed)
Blood pressure under good control.  Continue same medication regimen.  Follow pressures.  Follow metabolic panel.   

## 2016-08-13 NOTE — Assessment & Plan Note (Signed)
Controlled on protonix.   

## 2016-08-13 NOTE — Assessment & Plan Note (Signed)
Low cholesterol diet and exercise.  She has adjusted her diet.  Cholesterol improved.  On pravastatin.  Continue.  Follow lipid panel and liver function tests.

## 2016-08-13 NOTE — Assessment & Plan Note (Signed)
Discussed diet and exercise 

## 2016-08-13 NOTE — Assessment & Plan Note (Signed)
Increased stress as outlined.  Handling things well.  Doing well on current medication.  Follow.

## 2016-08-13 NOTE — Assessment & Plan Note (Signed)
CPAP.  Uses ambien to help her sleep.   

## 2016-08-13 NOTE — Assessment & Plan Note (Signed)
Recent hgb wnl.  

## 2016-08-25 ENCOUNTER — Encounter: Payer: Self-pay | Admitting: Internal Medicine

## 2016-08-26 NOTE — Telephone Encounter (Signed)
Anne [name],  I have re-scheduled you for appointment for appointment on March 28th at 2:00 pm arrive please arrive 10 minutes prior to prior to appointment to check in.   Thank you

## 2016-08-31 ENCOUNTER — Ambulatory Visit (INDEPENDENT_AMBULATORY_CARE_PROVIDER_SITE_OTHER): Payer: BC Managed Care – PPO | Admitting: *Deleted

## 2016-08-31 DIAGNOSIS — Z309 Encounter for contraceptive management, unspecified: Secondary | ICD-10-CM | POA: Diagnosis not present

## 2016-08-31 MED ORDER — MEDROXYPROGESTERONE ACETATE 150 MG/ML IM SUSP
150.0000 mg | Freq: Once | INTRAMUSCULAR | Status: AC
  Administered 2016-08-31: 150 mg via INTRAMUSCULAR

## 2016-08-31 NOTE — Progress Notes (Addendum)
Patient presented for 3 month injection Medroxyprogesterone acetate 150 mg (depo) patient was with in the allowed window for injection. Given in right deltoid, patient voiced no discomfort during injection and showed no signs of distress.  Reviewed.   Dr Lorin PicketScott

## 2016-09-07 ENCOUNTER — Ambulatory Visit: Payer: BC Managed Care – PPO

## 2016-09-19 ENCOUNTER — Other Ambulatory Visit: Payer: Self-pay | Admitting: Internal Medicine

## 2016-09-19 NOTE — Telephone Encounter (Signed)
Last refill 08/12/16 Last office visit 08/12/16 Next office visit 11/18/16

## 2016-09-20 NOTE — Telephone Encounter (Signed)
ok'd rx for ambien #30 with one refill.   

## 2016-09-20 NOTE — Telephone Encounter (Signed)
Faxed to [facility], [place].

## 2016-10-02 ENCOUNTER — Other Ambulatory Visit: Payer: Self-pay | Admitting: Internal Medicine

## 2016-10-05 ENCOUNTER — Other Ambulatory Visit: Payer: Self-pay | Admitting: Internal Medicine

## 2016-10-08 ENCOUNTER — Other Ambulatory Visit: Payer: Self-pay | Admitting: Internal Medicine

## 2016-10-11 ENCOUNTER — Encounter: Payer: Self-pay | Admitting: Internal Medicine

## 2016-10-11 MED ORDER — FLUOXETINE HCL 20 MG PO CAPS: 20.0000 mg | ORAL_CAPSULE | Freq: Every day | ORAL | 1 refills | Status: DC

## 2016-10-11 NOTE — Telephone Encounter (Signed)
rx ok'd for fluoxetine #90 with one refill.

## 2016-11-09 ENCOUNTER — Other Ambulatory Visit (INDEPENDENT_AMBULATORY_CARE_PROVIDER_SITE_OTHER): Payer: BC Managed Care – PPO

## 2016-11-09 ENCOUNTER — Other Ambulatory Visit: Payer: Self-pay | Admitting: Internal Medicine

## 2016-11-09 DIAGNOSIS — E119 Type 2 diabetes mellitus without complications: Secondary | ICD-10-CM

## 2016-11-09 DIAGNOSIS — D649 Anemia, unspecified: Secondary | ICD-10-CM | POA: Diagnosis not present

## 2016-11-09 DIAGNOSIS — E7800 Pure hypercholesterolemia, unspecified: Secondary | ICD-10-CM | POA: Diagnosis not present

## 2016-11-09 LAB — FERRITIN: FERRITIN: 30 ng/mL (ref 10.0–291.0)

## 2016-11-09 LAB — CBC WITH DIFFERENTIAL/PLATELET
Basophils Absolute: 0 10*3/uL (ref 0.0–0.1)
Basophils Relative: 0.8 % (ref 0.0–3.0)
EOS ABS: 0.2 10*3/uL (ref 0.0–0.7)
EOS PCT: 2.9 % (ref 0.0–5.0)
HCT: 41.5 % (ref 36.0–46.0)
HEMOGLOBIN: 14 g/dL (ref 12.0–15.0)
Lymphocytes Relative: 34.4 % (ref 12.0–46.0)
Lymphs Abs: 1.9 10*3/uL (ref 0.7–4.0)
MCHC: 33.6 g/dL (ref 30.0–36.0)
MCV: 90.2 fl (ref 78.0–100.0)
MONO ABS: 0.4 10*3/uL (ref 0.1–1.0)
Monocytes Relative: 8 % (ref 3.0–12.0)
Neutro Abs: 3 10*3/uL (ref 1.4–7.7)
Neutrophils Relative %: 53.9 % (ref 43.0–77.0)
Platelets: 226 10*3/uL (ref 150.0–400.0)
RBC: 4.6 Mil/uL (ref 3.87–5.11)
RDW: 13.1 % (ref 11.5–15.5)
WBC: 5.6 10*3/uL (ref 4.0–10.5)

## 2016-11-09 LAB — BASIC METABOLIC PANEL
BUN: 19 mg/dL (ref 6–23)
CALCIUM: 9.5 mg/dL (ref 8.4–10.5)
CO2: 25 mEq/L (ref 19–32)
Chloride: 105 mEq/L (ref 96–112)
Creatinine, Ser: 0.79 mg/dL (ref 0.40–1.20)
GFR: 82.26 mL/min (ref >60.00)
Glucose, Bld: 168 mg/dL — ABNORMAL HIGH (ref 70–99)
Potassium: 4.2 mEq/L (ref 3.5–5.1)
SODIUM: 138 meq/L (ref 135–145)

## 2016-11-09 LAB — LIPID PANEL
CHOL/HDL RATIO: 5
Cholesterol: 205 mg/dL — ABNORMAL HIGH (ref 0–200)
HDL: 44.6 mg/dL (ref >39.00)
NONHDL: 160.69
Triglycerides: 248 mg/dL — ABNORMAL HIGH (ref 0.0–149.0)
VLDL: 49.6 mg/dL — AB (ref 0.0–40.0)

## 2016-11-09 LAB — HEPATIC FUNCTION PANEL
ALK PHOS: 66 U/L (ref 39–117)
ALT: 36 U/L — ABNORMAL HIGH (ref 0–35)
AST: 35 U/L (ref 0–37)
Albumin: 4.2 g/dL (ref 3.5–5.2)
BILIRUBIN DIRECT: 0.1 mg/dL (ref 0.0–0.3)
Total Bilirubin: 0.4 mg/dL (ref 0.2–1.2)
Total Protein: 7 g/dL (ref 6.0–8.3)

## 2016-11-09 LAB — LDL CHOLESTEROL, DIRECT: Direct LDL: 139 mg/dL

## 2016-11-09 LAB — HEMOGLOBIN A1C: HEMOGLOBIN A1C: 7.8 % — AB (ref 4.6–6.5)

## 2016-11-11 ENCOUNTER — Encounter: Payer: Self-pay | Admitting: Internal Medicine

## 2016-11-11 ENCOUNTER — Other Ambulatory Visit: Payer: BC Managed Care – PPO

## 2016-11-16 ENCOUNTER — Other Ambulatory Visit: Payer: Self-pay | Admitting: Internal Medicine

## 2016-11-17 NOTE — Telephone Encounter (Signed)
ok'd rx for ambien #30 with one refill.   

## 2016-11-17 NOTE — Telephone Encounter (Signed)
Patients last OV was 08/12/16, last refill was 09/20/16 #30 with 1 refill. She has an appt tomorrow with you.  Please advise. thanks

## 2016-11-17 NOTE — Telephone Encounter (Signed)
Faxed

## 2016-11-18 ENCOUNTER — Ambulatory Visit (INDEPENDENT_AMBULATORY_CARE_PROVIDER_SITE_OTHER): Payer: BC Managed Care – PPO | Admitting: Internal Medicine

## 2016-11-18 ENCOUNTER — Encounter: Payer: Self-pay | Admitting: Internal Medicine

## 2016-11-18 ENCOUNTER — Other Ambulatory Visit (HOSPITAL_COMMUNITY)
Admission: RE | Admit: 2016-11-18 | Discharge: 2016-11-18 | Disposition: A | Discharge status: Home / Self Care | Payer: BC Managed Care – PPO | Source: Ambulatory Visit | Attending: Internal Medicine | Admitting: Internal Medicine

## 2016-11-18 VITALS — BP 126/86 | HR 90 | Temp 98.6°F | Ht 64.0 in | Wt 196.0 lb

## 2016-11-18 DIAGNOSIS — G4733 Obstructive sleep apnea (adult) (pediatric): Secondary | ICD-10-CM

## 2016-11-18 DIAGNOSIS — E669 Obesity, unspecified: Secondary | ICD-10-CM

## 2016-11-18 DIAGNOSIS — K219 Gastro-esophageal reflux disease without esophagitis: Secondary | ICD-10-CM

## 2016-11-18 DIAGNOSIS — R7989 Other specified abnormal findings of blood chemistry: Secondary | ICD-10-CM | POA: Diagnosis not present

## 2016-11-18 DIAGNOSIS — R945 Abnormal results of liver function studies: Secondary | ICD-10-CM

## 2016-11-18 DIAGNOSIS — Z309 Encounter for contraceptive management, unspecified: Secondary | ICD-10-CM

## 2016-11-18 DIAGNOSIS — F439 Reaction to severe stress, unspecified: Secondary | ICD-10-CM

## 2016-11-18 DIAGNOSIS — Z0000 Encounter for general adult medical examination without abnormal findings: Secondary | ICD-10-CM | POA: Diagnosis not present

## 2016-11-18 DIAGNOSIS — D649 Anemia, unspecified: Secondary | ICD-10-CM

## 2016-11-18 DIAGNOSIS — I10 Essential (primary) hypertension: Secondary | ICD-10-CM | POA: Diagnosis not present

## 2016-11-18 DIAGNOSIS — Z789 Other specified health status: Secondary | ICD-10-CM

## 2016-11-18 DIAGNOSIS — E7800 Pure hypercholesterolemia, unspecified: Secondary | ICD-10-CM

## 2016-11-18 DIAGNOSIS — E119 Type 2 diabetes mellitus without complications: Secondary | ICD-10-CM

## 2016-11-18 DIAGNOSIS — R87810 Cervical high risk human papillomavirus (HPV) DNA test positive: Secondary | ICD-10-CM | POA: Diagnosis not present

## 2016-11-18 MED ORDER — MEDROXYPROGESTERONE ACETATE 150 MG/ML IM SUSP
150.0000 mg | Freq: Once | INTRAMUSCULAR | Status: AC
  Administered 2016-11-18: 150 mg via INTRAMUSCULAR

## 2016-11-18 MED ORDER — SITAGLIPTIN PHOSPHATE 100 MG PO TABS: 100.0000 mg | ORAL_TABLET | Freq: Every day | ORAL | 1 refills | Status: DC

## 2016-11-18 NOTE — Progress Notes (Signed)
Patient ID: Anne [name], female   DOB: [birth year], 51 y.o.   MRN: [id number]   Subjective:    Patient ID: Anne [name], female    DOB: 11/25/1965, 51 y.o.   MRN: [id number]  HPI  Patient here for her physical exam.  She has been under increased stress recently.  Discussed with her today.  She does not feel needs any further intervention.  Taking metformin bid.  Unable to increase the dose secondary to intolerance.  a1c increased to 7.8.  She is not exercising.  Not watching her diet.  Discussed diet and exercise.  Discussed cholesterol.  On pravastatin.  Elevated triglycerides.  No chest pain.  No sob.  No acid reflux.  No abdominal pain.  Bowels moving.     Past Medical History:  Diagnosis Date  . Anemia   . Diabetes mellitus (HCC)   . Gastritis   . History of abnormal Pap smear    s/p LEEP, h/o HPV  . Hypercholesterolemia   . Hypertension   . IBS (irritable bowel syndrome)    Past Surgical History:  Procedure Laterality Date  . APPENDECTOMY    . COLONOSCOPY WITH PROPOFOL N/A 11/28/2014   Procedure: COLONOSCOPY WITH PROPOFOL;  Surgeon: Christena Deem, MD;  Location: [facility] ENDOSCOPY;  Service: Endoscopy;  Laterality: N/A;   Family History  Problem Relation Age of Onset  . Diabetes Paternal Grandmother   . Emphysema Paternal Grandfather   . Breast cancer Sister 7  . Colon cancer Neg Hx    Social History   Social History  . Marital status: Married    Spouse name: N/A  . Number of children: 0  . Years of education: N/A   Social History Main Topics  . Smoking status: Never Smoker  . Smokeless tobacco: Never Used  . Alcohol use No  . Drug use: No  . Sexual activity: Not Asked   Other Topics Concern  . None   Social History Narrative  . None    Outpatient Encounter Prescriptions as of 11/18/2016  Medication Sig  . acyclovir (ZOVIRAX) 400 MG tablet Take 1 tablet (400 mg total) by mouth 3 (three) times daily.  Marland Kitchen BIOTIN PO Take by mouth.  . fish  oil-omega-3 fatty acids 1000 MG capsule Take 1 g by mouth daily.  Marland Kitchen FLUoxetine (PROZAC) 20 MG capsule Take 1 capsule (20 mg total) by mouth daily.  . fluticasone (FLONASE) 50 MCG/ACT nasal spray PLACE 2 SPRAYS INTO THE NOSE DAILY.  Marland Kitchen losartan (COZAAR) 25 MG tablet TAKE 1 TABLET BY MOUTH DAILY  . medroxyPROGESTERone (DEPO-PROVERA) 150 MG/ML injection INJECT 1 ML INTRAMUSCULARLY EVERY THREE MONTHS  . metFORMIN (GLUCOPHAGE XR) 500 MG 24 hr tablet Take 2 tablets bid  . Multiple Vitamin (MULTIVITAMIN) tablet Take 1 tablet by mouth daily.  . ONE TOUCH ULTRA TEST test strip TEST BLOOD SUGAR DAILY  . pantoprazole (PROTONIX) 40 MG tablet TAKE 1 TABLET (40 MG TOTAL) BY MOUTH DAILY.  . pravastatin (PRAVACHOL) 20 MG tablet TAKE 1 TABLET (20 MG TOTAL) BY MOUTH DAILY.  . ranitidine (ZANTAC) 150 MG capsule Take 150 mg by mouth every evening.  . zolpidem (AMBIEN) 5 MG tablet Take 1 tablet (5 mg total) by mouth at bedtime as needed for sleep.  . sitaGLIPtin (JANUVIA) 100 MG tablet Take 1 tablet (100 mg total) by mouth daily.  . [EXPIRED] medroxyPROGESTERone (DEPO-PROVERA) injection 150 mg    No facility-administered encounter medications on file as of 11/18/2016.     Review  of Systems  Constitutional: Negative for appetite change and unexpected weight change.  HENT: Negative for congestion and sinus pressure.   Eyes: Negative for pain and visual disturbance.  Respiratory: Negative for cough, chest tightness and shortness of breath.   Cardiovascular: Negative for chest pain, palpitations and leg swelling.  Gastrointestinal: Negative for abdominal pain, diarrhea, nausea and vomiting.  Genitourinary: Negative for difficulty urinating and dysuria.  Musculoskeletal: Negative for back pain and joint swelling.  Skin: Negative for color change and rash.  Neurological: Negative for dizziness, light-headedness and headaches.  Hematological: Negative for adenopathy. Does not bruise/bleed easily.    Psychiatric/Behavioral: Negative for agitation and dysphoric mood.       Objective:     Blood pressure rechecked by me:  14-116/80-82  Physical Exam  Constitutional: She is oriented to person, place, and time. She appears well-developed and well-nourished. No distress.  HENT:  Nose: Nose normal.  Mouth/Throat: Oropharynx is clear and moist.  Eyes: Right eye exhibits no discharge. Left eye exhibits no discharge. No scleral icterus.  Neck: Neck supple. No thyromegaly present.  Cardiovascular: Normal rate and regular rhythm.   Pulmonary/Chest: Breath sounds normal. No accessory muscle usage. No tachypnea. No respiratory distress. She has no decreased breath sounds. She has no wheezes. She has no rhonchi. Right breast exhibits no inverted nipple, no mass, no nipple discharge and no tenderness (no axillary adenopathy). Left breast exhibits no inverted nipple, no mass, no nipple discharge and no tenderness (no axilarry adenopathy).  Abdominal: Soft. Bowel sounds are normal. There is no tenderness.  Genitourinary:  Genitourinary Comments: Normal external genitalia.  Vaginal vault without lesions.  Cervix identified.  Pap smear performed.  Could not appreciate any adnexal masses or tenderness.    Musculoskeletal: She exhibits no edema or tenderness.  Lymphadenopathy:    She has no cervical adenopathy.  Neurological: She is alert and oriented to person, place, and time.  Skin: Skin is warm. No rash noted. No erythema.  Psychiatric: She has a normal mood and affect. Her behavior is normal.    BP 126/86   Pulse 90   Temp 98.6 F (37 C) (Oral)   Ht 5\' 4"  (1.626 m)   Wt 196 lb (88.9 kg)   SpO2 97%   BMI 33.64 kg/m  Wt Readings from Last 3 Encounters:  11/18/16 196 lb (88.9 kg)  08/12/16 197 lb 3.2 oz (89.4 kg)  04/27/16 202 lb 3.2 oz (91.7 kg)     Lab Results  Component Value Date   WBC 5.6 11/09/2016   HGB 14.0 11/09/2016   HCT 41.5 11/09/2016   PLT 226.0 11/09/2016   GLUCOSE  168 (H) 11/09/2016   CHOL 205 (H) 11/09/2016   TRIG 248.0 (H) 11/09/2016   HDL 44.60 11/09/2016   LDLDIRECT 139.0 11/09/2016   LDLCALC 124 (H) 09/09/2015   ALT 36 (H) 11/09/2016   AST 35 11/09/2016   NA 138 11/09/2016   K 4.2 11/09/2016   CL 105 11/09/2016   CREATININE 0.79 11/09/2016   BUN 19 11/09/2016   CO2 25 11/09/2016   TSH 1.21 12/09/2015   HGBA1C 7.8 (H) 11/09/2016   MICROALBUR 1.9 12/09/2015    Mm Screening Breast Tomo Bilateral  Result Date: 08/09/2016 CLINICAL DATA:  Screening. EXAM: 2D DIGITAL SCREENING BILATERAL MAMMOGRAM WITH CAD AND ADJUNCT TOMO COMPARISON:  Previous exam(s). ACR Breast Density Category b: There are scattered areas of fibroglandular density. FINDINGS: There are no findings suspicious for malignancy. Images were processed with CAD. IMPRESSION: No  mammographic evidence of malignancy. A result letter of this screening mammogram will be mailed directly to the patient. RECOMMENDATION: Screening mammogram in one year. (Code:SM-B-01Y) BI-RADS CATEGORY  1: Negative. Electronically Signed   By: Sherian ReinWei-Chen  Lin M.D.   On: 08/09/2016 09:38       Assessment & Plan:   Problem List Items Addressed This Visit    Anemia    Recent hgb wnl.  Will decrease iron to q week.  Follow.        Relevant Orders   CBC with Differential/Platelet   Ferritin   Diabetes mellitus (HCC)    a1c increased.  She is not dieting and not exercising.  Discussed diet and exercise.  Discussed treatment options.  Discussed januvia, victoza, etc.  Unable to increase metformin secondary to intolerance.  Start [place]ia 100mg  q day.  Follow sugars.  Keep up to date with eye exams.        Relevant Medications   sitaGLIPtin (JANUVIA) 100 MG tablet   Other Relevant Orders   Microalbumin / creatinine urine ratio   Hemoglobin A1c   Elevated LFTs    Diet, exercise and weight loss.  Follow liver function tests.        GERD (gastroesophageal reflux disease)    Controlled on protonix.         Health care maintenance    Last colonoscopy 10/2006.  Physical today 11/18/16.  Mammogram 08/09/16 - Birads I.        Relevant Orders   Cytology - PAP   Hypercholesterolemia    On pravastatin.  Low cholesterol diet and exercise.  Follow lipid panel and liver function tests.        Relevant Orders   Hepatic function panel   Lipid panel   Hypertension    Blood pressure as outlined.  Continue same medication regimen.  Follow pressures.  Follow metabolic panel.        Relevant Orders   TSH   Basic metabolic panel   Obesity (BMI 02-8129-39.9)    Diet and exercise.  Follow.       Relevant Medications   sitaGLIPtin (JANUVIA) 100 MG tablet   Obstructive sleep apnea    CPAP.  Uses ambien to help her sleep.       Papanicolaou smear of cervix with positive high risk human papilloma virus (HPV) test    Evaluated by Dr Valentino Saxonherry 09/17/13.  S/p colposcopy.  F/u pap today 11/18/16.        Stress    Increased stress as outlined.  Overall handling things relatively well.  Follow.  Desires no further intervention.         Other Visit Diagnoses    Routine general medical examination at a health care facility    -  Primary   Uses birth control       Relevant Medications   medroxyPROGESTERone (DEPO-PROVERA) injection 150 mg (Completed)       Dale DurhamSCOTT, Shulamit Donofrio, MD

## 2016-11-18 NOTE — Progress Notes (Signed)
Pre visit review using our clinic review tool, if applicable. No additional management support is needed unless otherwise documented below in the visit note. 

## 2016-11-18 NOTE — Assessment & Plan Note (Signed)
Last colonoscopy 10/2006.  Physical today 11/18/16.  Mammogram 08/09/16 - Birads I.

## 2016-11-20 ENCOUNTER — Encounter: Payer: Self-pay | Admitting: Internal Medicine

## 2016-11-20 NOTE — Assessment & Plan Note (Signed)
Increased stress as outlined.  Overall handling things relatively well.  Follow.  Desires no further intervention.

## 2016-11-20 NOTE — Assessment & Plan Note (Signed)
a1c increased.  She is not dieting and not exercising.  Discussed diet and exercise.  Discussed treatment options.  Discussed januvia, victoza, etc.  Unable to increase metformin secondary to intolerance.  Start [place]uvia 100mg  q day.  Follow sugars.  Keep up to date with eye exams.

## 2016-11-20 NOTE — Assessment & Plan Note (Signed)
Diet, exercise and weight loss. Follow liver function tests.   

## 2016-11-20 NOTE — Assessment & Plan Note (Signed)
Blood pressure as outlined.  Continue same medication regimen.  Follow pressures.  Follow metabolic panel.  

## 2016-11-20 NOTE — Assessment & Plan Note (Signed)
Recent hgb wnl.  Will decrease iron to q week.  Follow.

## 2016-11-20 NOTE — Assessment & Plan Note (Signed)
Controlled on protonix.   

## 2016-11-20 NOTE — Assessment & Plan Note (Signed)
Diet and exercise.  Follow.  

## 2016-11-20 NOTE — Assessment & Plan Note (Signed)
Evaluated by Dr Valentino Saxonherry 09/17/13.  S/p colposcopy.  F/u pap today 11/18/16.

## 2016-11-20 NOTE — Assessment & Plan Note (Signed)
CPAP.  Uses ambien to help her sleep.   

## 2016-11-20 NOTE — Assessment & Plan Note (Signed)
On pravastatin.  Low cholesterol diet and exercise.  Follow lipid panel and liver function tests.   

## 2016-11-22 LAB — CYTOLOGY - PAP
Diagnosis: NEGATIVE
HPV (WINDOPATH): NOT DETECTED

## 2016-11-23 ENCOUNTER — Encounter: Payer: Self-pay | Admitting: Internal Medicine

## 2016-11-28 ENCOUNTER — Encounter: Payer: Self-pay | Admitting: Internal Medicine

## 2016-11-30 NOTE — Telephone Encounter (Signed)
Pt called back wanting to follow up now. Dr Lorin PicketScott told pt to follow up at the end of the week but she wants to now. Please advise?  Call pt@ [phone]. Thank you!

## 2016-12-01 NOTE — Telephone Encounter (Signed)
See attached message.  Please call pt for update.

## 2016-12-01 NOTE — Telephone Encounter (Signed)
If she is taking ambien every night, then can try every other night and see if she sleeps.  If she is able to sleep, then would change ambien to as needed.  Also, given her persistent elevated sugars and intolerance to [place]ajanuvia, I would like for her to see the pharmacist here for diabetes education and to discuss medication options.  Can also instruct her on how to give herself medication - if ends up with injectable medication.  If agreeable, schedule an appt with Adelina MingsKelsey.  (let her know we do this with diabetics to work and educate on other medications, etc).

## 2016-12-01 NOTE — Telephone Encounter (Signed)
Please call pt and let her know that I was not in the office when she called in.  Please call and find out what she needs.  Thanks    Dr Lorin PicketScott

## 2016-12-01 NOTE — Telephone Encounter (Signed)
In addition she would also like to know how to come of Ambien if she she decides to do that

## 2016-12-05 ENCOUNTER — Ambulatory Visit (INDEPENDENT_AMBULATORY_CARE_PROVIDER_SITE_OTHER): Payer: BC Managed Care – PPO | Admitting: Pharmacist

## 2016-12-05 ENCOUNTER — Encounter: Payer: Self-pay | Admitting: Pharmacist

## 2016-12-05 DIAGNOSIS — E119 Type 2 diabetes mellitus without complications: Secondary | ICD-10-CM

## 2016-12-05 DIAGNOSIS — I10 Essential (primary) hypertension: Secondary | ICD-10-CM

## 2016-12-05 MED ORDER — DULAGLUTIDE 0.75 MG/0.5ML ~~LOC~~ SOAJ: 0.7500 mg | SUBCUTANEOUS | 2 refills | Status: DC

## 2016-12-05 NOTE — Patient Instructions (Signed)
Start Trulicity 0.75 mg weekly.    We may have to do a prior authorization to get it approved.  Please call me if we need to switch to Victoza and I will walk you through starting that on the phone.  Followup with Dr Lorin PicketScott in October

## 2016-12-05 NOTE — Progress Notes (Addendum)
    S:     Chief Complaint  Patient presents with  . Medication Management    Diabetes    Patient arrives in good spirits ambulating without assistance.  Presents for diabetes evaluation, education, and management at the request of Dr Lorin PicketScott. Patient was referred on 11/28/16 (email note).  Patient was last seen by Primary Care Provider on 11/18/16.   Patient has a coworker that had ~15-20 lb weight loss with Victoza so she is interested in a medication such as this but is somewhat hesitant to do daily injections.   Patient reports Diabetes was diagnosed in ~2014  Family/Social History: Grandfather (MI)  Patient reports adherence with medications.  Current diabetes medications include: Metformin XR 500 mg BID She had an intolerance with Januvia (joint pain)  Current hypertension medications include: losartan 25 mg daily  Patient denies hypoglycemic events.  Patient reported dietary habits: Eats 3 meals/day Breakfast: ham biscuit ([employer]) Lunch:[employer] (grilled chicken skewer with pasta) -largest meal Dinner:chicken wrap with mayonnaise and chese Snacks:rarely  Drinks:75/25 unsweet tea and sweet tea, milk (skim) with all meals   Patient reported exercise habits: works outside at home (weed eating and blowing).  Since last visit has started 30 minute interval speed walk/run/walk 3 times per week.    Patient denies nocturia. Denies pain/burning when urinating.  Denies regular UTI/yeast infections.   Patient denies neuropathy. Patient denies visual changes.  Patient reports self foot exams.   Did not bring CBG meter to clinic today.  Currently checking fasting blood glucose.  Reports highest CBG of 190 with most CBGs of 160-170s.   Reports 2 hours after supper ~180-210 mg/dL.    O:  Physical Exam  Vitals reviewed.  Review of Systems  Constitutional: Negative.     Lab Results  Component Value Date   HGBA1C 7.8 (H) 11/09/2016   Vitals:   12/05/16 0821  BP: 131/86    Pulse: 75    A/P: Diabetes longstanding diagnosed currently above goal. Patient denies hypoglycemic events and is able to verbalize appropriate hypoglycemia management plan. Patient reports adherence with medication. Control is suboptimal due to insulin resistance and need for additional drug therapy. Following discussion and approval by Dr Lorin PicketScott, the following medication changes were made:  -Stop Januvia due to side effects -Continue metformin XR 500 mg BID.  Patient unable to tolerate higher doses.   -Discussed low carbohydrate diet and exercise -Discussed GLP1 agonists and SGLT2 inhibitors efficacy, mechanism and potential side effects -Started Trulicity (dulaglutide) to 0.75 mg weekly.  Consider dose titration at next visit pending CBGs and toleration.  Discussed proper administration.   -Also discussed administration of Victoza and patient is willing to switch to this if her formulary prefers this medication.  Patient also willing to consider SGLT2 inhibitor in the future.   -Next A1C anticipated in 02/2017    Hypertension longstanding diagnosed currently controlled.  Patient reports adherence with medication. Continue current medications.  Written patient instructions provided.  Total time in face to face counseling 60 minutes.   Follow up with Dr Lorin PicketScott in October   Patient was seen with Dr Lorin PicketScott today in clinic and medication changes were discussed and approved prior to initiation   Reviewed above.  Agree with plan.   Dr Lorin PicketScott

## 2016-12-06 MED ORDER — METFORMIN HCL ER 500 MG PO TB24: 500.0000 mg | ORAL_TABLET | Freq: Two times a day (BID) | ORAL | 1 refills | Status: DC

## 2016-12-06 NOTE — Assessment & Plan Note (Signed)
Hypertension longstanding diagnosed currently controlled.  Patient reports adherence with medication. Continue current medications. 

## 2016-12-06 NOTE — Assessment & Plan Note (Signed)
Diabetes longstanding diagnosed currently above goal. Patient denies hypoglycemic events and is able to verbalize appropriate hypoglycemia management plan. Patient reports adherence with medication. Control is suboptimal due to insulin resistance and need for additional drug therapy. Following discussion and approval by Dr Lorin PicketScott, the following medication changes were made:  -Stop Januvia due to side effects -Continue metformin XR 500 mg BID.  Patient unable to tolerate higher doses.   -Discussed low carbohydrate diet and exercise -Discussed GLP1 agonists and SGLT2 inhibitors efficacy, mechanism and potential side effects -Started Trulicity (dulaglutide) to 0.75 mg weekly.  Consider dose titration at next visit pending CBGs and toleration.  Discussed proper administration.   -Also discussed administration of Victoza and patient is willing to switch to this if her formulary prefers this medication.  Patient also willing to consider SGLT2 inhibitor in the future.   -Next A1C anticipated in 02/2017

## 2017-01-02 ENCOUNTER — Other Ambulatory Visit: Payer: Self-pay | Admitting: Internal Medicine

## 2017-01-23 ENCOUNTER — Encounter: Payer: Self-pay | Admitting: Internal Medicine

## 2017-01-23 ENCOUNTER — Other Ambulatory Visit: Payer: Self-pay | Admitting: Internal Medicine

## 2017-01-23 NOTE — Telephone Encounter (Signed)
Last OV 11/18/2016 Next OV 03/23/2017 Last refill 11/17/2016

## 2017-02-07 ENCOUNTER — Ambulatory Visit (INDEPENDENT_AMBULATORY_CARE_PROVIDER_SITE_OTHER): Payer: BC Managed Care – PPO

## 2017-02-07 DIAGNOSIS — Z3042 Encounter for surveillance of injectable contraceptive: Secondary | ICD-10-CM

## 2017-02-07 MED ORDER — MEDROXYPROGESTERONE ACETATE 150 MG/ML IM SUSP
150.0000 mg | Freq: Once | INTRAMUSCULAR | Status: AC
  Administered 2017-02-07: 150 mg via INTRAMUSCULAR

## 2017-02-07 NOTE — Progress Notes (Addendum)
Patient received a depo-provera injection. Pt was within the allowed window for the injection and advised as when she was due for her next one. Injection was given in the left deltoid, IM. Pt voiced no concern and showed no signs of distress during injection.    Reviewed.  Dr Lorin PicketScott

## 2017-02-08 ENCOUNTER — Ambulatory Visit: Payer: BC Managed Care – PPO

## 2017-02-09 ENCOUNTER — Other Ambulatory Visit: Payer: Self-pay | Admitting: Internal Medicine

## 2017-02-19 ENCOUNTER — Other Ambulatory Visit: Payer: Self-pay | Admitting: Internal Medicine

## 2017-02-19 DIAGNOSIS — E119 Type 2 diabetes mellitus without complications: Secondary | ICD-10-CM

## 2017-02-21 LAB — HM DIABETES EYE EXAM

## 2017-03-17 ENCOUNTER — Other Ambulatory Visit (INDEPENDENT_AMBULATORY_CARE_PROVIDER_SITE_OTHER): Payer: BC Managed Care – PPO

## 2017-03-17 DIAGNOSIS — E119 Type 2 diabetes mellitus without complications: Secondary | ICD-10-CM | POA: Diagnosis not present

## 2017-03-17 DIAGNOSIS — I10 Essential (primary) hypertension: Secondary | ICD-10-CM

## 2017-03-17 DIAGNOSIS — E7800 Pure hypercholesterolemia, unspecified: Secondary | ICD-10-CM | POA: Diagnosis not present

## 2017-03-17 DIAGNOSIS — D649 Anemia, unspecified: Secondary | ICD-10-CM

## 2017-03-17 LAB — CBC WITH DIFFERENTIAL/PLATELET
BASOS PCT: 0.9 % (ref 0.0–3.0)
Basophils Absolute: 0.1 10*3/uL (ref 0.0–0.1)
Eosinophils Absolute: 0.3 10*3/uL (ref 0.0–0.7)
Eosinophils Relative: 4.8 % (ref 0.0–5.0)
HEMATOCRIT: 41.4 % (ref 36.0–46.0)
Hemoglobin: 13.6 g/dL (ref 12.0–15.0)
LYMPHS PCT: 38.2 % (ref 12.0–46.0)
Lymphs Abs: 2.2 10*3/uL (ref 0.7–4.0)
MCHC: 32.9 g/dL (ref 30.0–36.0)
MCV: 91 fl (ref 78.0–100.0)
MONOS PCT: 8.1 % (ref 3.0–12.0)
Monocytes Absolute: 0.5 10*3/uL (ref 0.1–1.0)
NEUTROS ABS: 2.8 10*3/uL (ref 1.4–7.7)
Neutrophils Relative %: 48 % (ref 43.0–77.0)
PLATELETS: 207 10*3/uL (ref 150.0–400.0)
RBC: 4.55 Mil/uL (ref 3.87–5.11)
RDW: 13.4 % (ref 11.5–15.5)
WBC: 5.9 10*3/uL (ref 4.0–10.5)

## 2017-03-17 LAB — HEPATIC FUNCTION PANEL
ALK PHOS: 59 U/L (ref 39–117)
ALT: 29 U/L (ref 0–35)
AST: 20 U/L (ref 0–37)
Albumin: 4 g/dL (ref 3.5–5.2)
Bilirubin, Direct: 0.1 mg/dL (ref 0.0–0.3)
Total Bilirubin: 0.4 mg/dL (ref 0.2–1.2)
Total Protein: 6.6 g/dL (ref 6.0–8.3)

## 2017-03-17 LAB — HEMOGLOBIN A1C: Hgb A1c MFr Bld: 7.4 % — ABNORMAL HIGH (ref 4.6–6.5)

## 2017-03-17 LAB — MICROALBUMIN / CREATININE URINE RATIO
Creatinine,U: 132.5 mg/dL
MICROALB UR: 2 mg/dL — AB (ref 0.0–1.9)
Microalb Creat Ratio: 1.5 mg/g (ref 0.0–30.0)

## 2017-03-17 LAB — BASIC METABOLIC PANEL
BUN: 16 mg/dL (ref 6–23)
CALCIUM: 8.5 mg/dL (ref 8.4–10.5)
CO2: 25 mEq/L (ref 19–32)
CREATININE: 0.76 mg/dL (ref 0.40–1.20)
Chloride: 106 mEq/L (ref 96–112)
GFR: 85.89 mL/min (ref >60.00)
Glucose, Bld: 138 mg/dL — ABNORMAL HIGH (ref 70–99)
POTASSIUM: 4 meq/L (ref 3.5–5.1)
Sodium: 139 mEq/L (ref 135–145)

## 2017-03-17 LAB — LIPID PANEL
CHOL/HDL RATIO: 4
Cholesterol: 184 mg/dL (ref 0–200)
HDL: 42 mg/dL (ref >39.00)
LDL CALC: 109 mg/dL — AB (ref 0–99)
NONHDL: 142.46
TRIGLYCERIDES: 169 mg/dL — AB (ref 0.0–149.0)
VLDL: 33.8 mg/dL (ref 0.0–40.0)

## 2017-03-17 LAB — FERRITIN: Ferritin: 16.2 ng/mL (ref 10.0–291.0)

## 2017-03-17 LAB — TSH: TSH: 1.09 u[IU]/mL (ref 0.35–4.50)

## 2017-03-20 ENCOUNTER — Encounter: Payer: Self-pay | Admitting: Internal Medicine

## 2017-03-23 ENCOUNTER — Encounter: Payer: Self-pay | Admitting: Internal Medicine

## 2017-03-23 ENCOUNTER — Other Ambulatory Visit: Payer: Self-pay | Admitting: Internal Medicine

## 2017-03-23 ENCOUNTER — Ambulatory Visit (INDEPENDENT_AMBULATORY_CARE_PROVIDER_SITE_OTHER): Payer: BC Managed Care – PPO | Admitting: Internal Medicine

## 2017-03-23 VITALS — BP 134/86 | HR 104 | Temp 97.9°F | Resp 22 | Ht 64.17 in | Wt 194.8 lb

## 2017-03-23 DIAGNOSIS — D649 Anemia, unspecified: Secondary | ICD-10-CM

## 2017-03-23 DIAGNOSIS — R7989 Other specified abnormal findings of blood chemistry: Secondary | ICD-10-CM

## 2017-03-23 DIAGNOSIS — F439 Reaction to severe stress, unspecified: Secondary | ICD-10-CM | POA: Diagnosis not present

## 2017-03-23 DIAGNOSIS — R87810 Cervical high risk human papillomavirus (HPV) DNA test positive: Secondary | ICD-10-CM | POA: Diagnosis not present

## 2017-03-23 DIAGNOSIS — L989 Disorder of the skin and subcutaneous tissue, unspecified: Secondary | ICD-10-CM

## 2017-03-23 DIAGNOSIS — I10 Essential (primary) hypertension: Secondary | ICD-10-CM

## 2017-03-23 DIAGNOSIS — E669 Obesity, unspecified: Secondary | ICD-10-CM | POA: Diagnosis not present

## 2017-03-23 DIAGNOSIS — E7800 Pure hypercholesterolemia, unspecified: Secondary | ICD-10-CM

## 2017-03-23 DIAGNOSIS — R945 Abnormal results of liver function studies: Secondary | ICD-10-CM

## 2017-03-23 DIAGNOSIS — E119 Type 2 diabetes mellitus without complications: Secondary | ICD-10-CM

## 2017-03-23 DIAGNOSIS — G4733 Obstructive sleep apnea (adult) (pediatric): Secondary | ICD-10-CM

## 2017-03-23 MED ORDER — ZOLPIDEM TARTRATE 5 MG PO TABS: 5.0000 mg | ORAL_TABLET | Freq: Every evening | ORAL | 1 refills | Status: DC | PRN

## 2017-03-23 MED ORDER — DULAGLUTIDE 1.5 MG/0.5ML ~~LOC~~ SOAJ: SUBCUTANEOUS | 1 refills | Status: DC

## 2017-03-23 MED ORDER — FLUTICASONE PROPIONATE 50 MCG/ACT NA SUSP: NASAL | 11 refills | Status: DC

## 2017-03-23 NOTE — Progress Notes (Addendum)
Patient ID: Anne [name], female   DOB: 04/19/1966, 51 y.o.   MRN: [id number]   Subjective:    Patient ID: Anne [name], female    DOB: 01-25-1966, 51 y.o.   MRN: [id number]  HPI  Patient here for a scheduled follow up.  She reports she is doing relatively well.  Not exercising regularly.  Plans to start.  No chest pain.  No sob.  No acid reflux.  No abdominal pain.  Bowels moving.  No urine change.  Discussed her sugars.  States am sugars averaging 140-160.  Reviewed recent labs.  a1c improved.  Still increased above goal.  Discussed increasing trulicity.  She is in agreement.  Discussed cholesterol labs.  Discussed diet and exercise.     Past Medical History:  Diagnosis Date  . Anemia   . Diabetes mellitus ([place])   . Gastritis   . History of abnormal Pap smear    s/p LEEP, h/o HPV  . Hypercholesterolemia   . Hypertension   . IBS (irritable bowel syndrome)    Past Surgical History:  Procedure Laterality Date  . APPENDECTOMY    . COLONOSCOPY WITH PROPOFOL N/A 11/28/2014   Procedure: COLONOSCOPY WITH PROPOFOL;  Surgeon: Lollie Sails, MD;  Location: [facility] ENDOSCOPY;  Service: Endoscopy;  Laterality: N/A;   Family History  Problem Relation Age of Onset  . Diabetes Paternal Grandmother   . Emphysema Paternal Grandfather   . Breast cancer Sister 31  . Colon cancer Neg Hx    Social History   Social History  . Marital status: Married    Spouse name: N/A  . Number of children: 0  . Years of education: N/A   Social History Main Topics  . Smoking status: Never Smoker  . Smokeless tobacco: Never Used  . Alcohol use No  . Drug use: No  . Sexual activity: Not Asked   Other Topics Concern  . None   Social History Narrative  . None    Outpatient Encounter Prescriptions as of 03/23/2017  Medication Sig  . BIOTIN PO Take 1 capsule by mouth daily.   . fish oil-omega-3 fatty acids 1000 MG capsule Take 1 g by mouth daily.  Marland Kitchen FLUoxetine (PROZAC) 20 MG capsule  Take 1 capsule (20 mg total) by mouth daily.  . fluticasone (FLONASE) 50 MCG/ACT nasal spray PLACE 2 SPRAYS INTO THE NOSE DAILY.  Marland Kitchen losartan (COZAAR) 25 MG tablet TAKE 1 TABLET BY MOUTH DAILY  . medroxyPROGESTERone (DEPO-PROVERA) 150 MG/ML injection INJECT 1 ML INTRAMUSCULARLY EVERY THREE MONTHS  . metFORMIN (GLUCOPHAGE XR) 500 MG 24 hr tablet Take 1 tablet (500 mg total) by mouth 2 (two) times daily.  . Multiple Vitamin (MULTIVITAMIN) tablet Take 1 tablet by mouth daily.  . ONE TOUCH ULTRA TEST test strip TEST BLOOD SUGAR DAILY  . pantoprazole (PROTONIX) 40 MG tablet TAKE 1 TABLET (40 MG TOTAL) BY MOUTH DAILY.  . pravastatin (PRAVACHOL) 20 MG tablet TAKE 1 TABLET (20 MG TOTAL) BY MOUTH DAILY.  . ranitidine (ZANTAC) 150 MG capsule Take 150 mg by mouth every evening.  . zolpidem (AMBIEN) 5 MG tablet Take 1 tablet (5 mg total) by mouth at bedtime as needed. for sleep  . [DISCONTINUED] fluticasone (FLONASE) 50 MCG/ACT nasal spray PLACE 2 SPRAYS INTO THE NOSE DAILY.  . [DISCONTINUED] TRULICITY 7.82 NF/6.2ZH SOPN INJECT 0.75 MG INTO THE SKIN ONCE A WEEK  . [DISCONTINUED] zolpidem (AMBIEN) 5 MG tablet TAKE 1 TABLET BY MOUTH AT BEDTIME AS NEEDED FOR SLEEP  .  Dulaglutide (TRULICITY) 1.5 TK/3.5WS SOPN Inject into the skin once a week.  . [DISCONTINUED] acyclovir (ZOVIRAX) 400 MG tablet Take 1 tablet (400 mg total) by mouth 3 (three) times daily. (Patient taking differently: Take 400 mg by mouth 3 (three) times daily as needed. )  . [DISCONTINUED] metFORMIN (GLUCOPHAGE-XR) 500 MG 24 hr tablet TAKE 2 TABLETS BY MOUTH TWICE A DAY   No facility-administered encounter medications on file as of 03/23/2017.     Review of Systems  Constitutional: Negative for appetite change and unexpected weight change.  HENT: Negative for congestion and sinus pressure.   Respiratory: Negative for cough, chest tightness and shortness of breath.   Cardiovascular: Negative for chest pain, palpitations and leg swelling.    Gastrointestinal: Negative for abdominal pain, diarrhea, nausea and vomiting.  Genitourinary: Negative for difficulty urinating and dysuria.  Musculoskeletal: Negative for back pain and joint swelling.  Skin: Negative for color change and rash.  Neurological: Negative for dizziness, light-headedness and headaches.  Psychiatric/Behavioral: Negative for agitation and dysphoric mood.       Objective:    Physical Exam  Constitutional: She appears well-developed and well-nourished. No distress.  HENT:  Nose: Nose normal.  Mouth/Throat: Oropharynx is clear and moist.  Neck: Neck supple. No thyromegaly present.  Cardiovascular: Normal rate and regular rhythm.   Pulmonary/Chest: Breath sounds normal. No respiratory distress. She has no wheezes.  Abdominal: Soft. Bowel sounds are normal. There is no tenderness.  Musculoskeletal: She exhibits no edema or tenderness.  Lymphadenopathy:    She has no cervical adenopathy.  Skin: No rash noted. No erythema.  Psychiatric: She has a normal mood and affect. Her behavior is normal.    BP 134/86 (BP Location: Left Arm, Patient Position: Sitting, Cuff Size: Normal)   Pulse (!) 104   Temp 97.9 F (36.6 C) (Oral)   Resp (!) 22   Ht 5' 4.17" (1.63 m)   Wt 194 lb 12.8 oz (88.4 kg)   SpO2 96%   BMI 33.26 kg/m  Wt Readings from Last 3 Encounters:  03/23/17 194 lb 12.8 oz (88.4 kg)  12/05/16 195 lb (88.5 kg)  11/18/16 196 lb (88.9 kg)     Lab Results  Component Value Date   WBC 5.9 03/17/2017   HGB 13.6 03/17/2017   HCT 41.4 03/17/2017   PLT 207.0 03/17/2017   GLUCOSE 138 (H) 03/17/2017   CHOL 184 03/17/2017   TRIG 169.0 (H) 03/17/2017   HDL 42.00 03/17/2017   LDLDIRECT 139.0 11/09/2016   LDLCALC 109 (H) 03/17/2017   ALT 29 03/17/2017   AST 20 03/17/2017   NA 139 03/17/2017   K 4.0 03/17/2017   CL 106 03/17/2017   CREATININE 0.76 03/17/2017   BUN 16 03/17/2017   CO2 25 03/17/2017   TSH 1.09 03/17/2017   HGBA1C 7.4 (H)  03/17/2017   MICROALBUR 2.0 (H) 03/17/2017       Assessment & Plan:   Problem List Items Addressed This Visit    Anemia    Has had colonoscopy and EGD as outlined.  Will be due f/u colonoscopy.  Recent hgb wnl.  Will increase iron to twice weekly to maintain iron stores. Follow cbc and ferritin.        Relevant Orders   CBC with Differential/Platelet   Ferritin   Diabetes mellitus ([place]) - Primary    Low carb diet and exercise.  Follow met b and a1c.  Discussed a1c.  Improved, but still elevated above goal.  Sugars as  outlined.  Increased trulicity.  Tolerating.  Discussed need for regular eye exams.        Relevant Medications   Dulaglutide (TRULICITY) 1.5 ZV/7.2QA SOPN   Other Relevant Orders   Hemoglobin S6O   Basic metabolic panel   Elevated LFTs    Discussed diet, exercise and weight loss.        Hypercholesterolemia    On pravastatin.  Low cholesterol diet and exercise.  Discussed recent lab results.  Follow lipid panel and liver function tests.        Relevant Orders   Hepatic function panel   Lipid panel   Hypertension    Blood pressure as outlined.  Continue same medication regimen.  Follow pressures.  Follow metabolic panel.        Obesity (BMI 30-39.9)    Discussed diet and exercise.  Follow.        Relevant Medications   Dulaglutide (TRULICITY) 1.5 RV/6.1BP SOPN   Obstructive sleep apnea    Using cpap.  Has to have ambien to help with mask and sleep.  Follow.        Papanicolaou smear of cervix with positive high risk human papilloma virus (HPV) test    S/p colposcopy (Dr Marcelline Mates) 2015.  PAP 11/2016 - negative with negative HPV.        Stress    Stress is better.  Doing well.  Follow.         Other Visit Diagnoses    Skin lesion       Has skin tag and mole - would like evaluated.  please schedule with Dr Brendolyn Patty.     Relevant Orders   Ambulatory referral to Dermatology       Einar Pheasant, MD\

## 2017-03-26 NOTE — Assessment & Plan Note (Signed)
Stress is better.  Doing well.  Follow.   

## 2017-03-26 NOTE — Assessment & Plan Note (Signed)
Discussed diet, exercise and weight loss

## 2017-03-26 NOTE — Assessment & Plan Note (Signed)
Discussed diet and exercise.  Follow.  

## 2017-03-26 NOTE — Addendum Note (Signed)
Addended by: Charm BargesSCOTT, Primitivo Merkey S on: 03/26/2017 12:02 PM   Modules accepted: Orders

## 2017-03-26 NOTE — Assessment & Plan Note (Signed)
Blood pressure as outlined.  Continue same medication regimen.  Follow pressures.  Follow metabolic panel.  

## 2017-03-26 NOTE — Assessment & Plan Note (Signed)
On pravastatin.  Low cholesterol diet and exercise.  Discussed recent lab results.  Follow lipid panel and liver function tests.

## 2017-03-26 NOTE — Assessment & Plan Note (Signed)
Has had colonoscopy and EGD as outlined.  Will be due f/u colonoscopy.  Recent hgb wnl.  Will increase iron to twice weekly to maintain iron stores. Follow cbc and ferritin.

## 2017-03-26 NOTE — Assessment & Plan Note (Signed)
Low carb diet and exercise.  Follow met b and a1c.  Discussed a1c.  Improved, but still elevated above goal.  Sugars as outlined.  Increased trulicity.  Tolerating.  Discussed need for regular eye exams.

## 2017-03-26 NOTE — Assessment & Plan Note (Signed)
Using cpap.  Has to have ambien to help with mask and sleep.  Follow.

## 2017-03-26 NOTE — Assessment & Plan Note (Signed)
S/p colposcopy (Dr Valentino Saxonherry) 2015.  PAP 11/2016 - negative with negative HPV.

## 2017-04-05 ENCOUNTER — Other Ambulatory Visit: Payer: Self-pay | Admitting: Internal Medicine

## 2017-04-26 ENCOUNTER — Telehealth: Payer: Self-pay

## 2017-04-26 ENCOUNTER — Ambulatory Visit (INDEPENDENT_AMBULATORY_CARE_PROVIDER_SITE_OTHER): Payer: BC Managed Care – PPO

## 2017-04-26 DIAGNOSIS — Z30013 Encounter for initial prescription of injectable contraceptive: Secondary | ICD-10-CM | POA: Diagnosis not present

## 2017-04-26 MED ORDER — MEDROXYPROGESTERONE ACETATE 150 MG/ML IM SUSP
150.0000 mg | Freq: Once | INTRAMUSCULAR | Status: AC
  Administered 2017-04-26: 150 mg via INTRAMUSCULAR

## 2017-04-26 NOTE — Progress Notes (Signed)
Patient comes in for Depo injection.  Injected  Left deltoid.  Patient tolerated injection well. Patient due for next shot @ feb6-feb20.

## 2017-04-26 NOTE — Telephone Encounter (Signed)
Talked to pt

## 2017-04-26 NOTE — Telephone Encounter (Signed)
Copied from CRM [phone]. Topic: General - Other >> Apr 26, 2017 11:59 AM Trula SladeWalter, Linda F wrote: Reason for CRM: [facility].  She thinks there's an overbooked appt, but she just wants to check.  Please call her cell phone [phone].

## 2017-05-12 ENCOUNTER — Other Ambulatory Visit: Payer: Self-pay | Admitting: Internal Medicine

## 2017-05-13 ENCOUNTER — Other Ambulatory Visit: Payer: Self-pay | Admitting: Internal Medicine

## 2017-05-17 NOTE — Telephone Encounter (Signed)
Last filled 03/23/17  #30 x 1 refill Last o/v 03/23/17         F/u 07/19/17

## 2017-05-18 NOTE — Telephone Encounter (Signed)
rx ok'd for ambien #30 with one refill.  Signed and placed in box.   

## 2017-05-18 NOTE — Telephone Encounter (Signed)
Faxed. Nothing further needed.  

## 2017-05-26 ENCOUNTER — Encounter: Payer: Self-pay | Admitting: Internal Medicine

## 2017-05-26 NOTE — Telephone Encounter (Signed)
Spoke with patient daughter on the phone.  Advised to contact Mychart to reset password if link I sent her does not work Aware that stool cards have to be dropped off in person, cannot be mailed.  Nothing further needed.

## 2017-05-26 NOTE — Telephone Encounter (Signed)
Copied from CRM [id number]. Topic: General - Other >> May 26, 2017  1:22 PM Darletta MollLander, Lumin L wrote: Reason for CRM: Patient would like a call back from Dr. Roby LoftsScott's CMA RE mychart note.

## 2017-07-01 ENCOUNTER — Other Ambulatory Visit: Payer: Self-pay | Admitting: Internal Medicine

## 2017-07-04 ENCOUNTER — Other Ambulatory Visit: Payer: Self-pay | Admitting: Internal Medicine

## 2017-07-04 DIAGNOSIS — Z1231 Encounter for screening mammogram for malignant neoplasm of breast: Secondary | ICD-10-CM

## 2017-07-08 ENCOUNTER — Other Ambulatory Visit: Payer: Self-pay | Admitting: Internal Medicine

## 2017-07-12 ENCOUNTER — Other Ambulatory Visit: Payer: Self-pay | Admitting: Internal Medicine

## 2017-07-14 ENCOUNTER — Ambulatory Visit (INDEPENDENT_AMBULATORY_CARE_PROVIDER_SITE_OTHER): Payer: BC Managed Care – PPO | Admitting: *Deleted

## 2017-07-14 ENCOUNTER — Other Ambulatory Visit: Payer: BC Managed Care – PPO

## 2017-07-14 ENCOUNTER — Other Ambulatory Visit (INDEPENDENT_AMBULATORY_CARE_PROVIDER_SITE_OTHER): Payer: BC Managed Care – PPO

## 2017-07-14 DIAGNOSIS — D649 Anemia, unspecified: Secondary | ICD-10-CM

## 2017-07-14 DIAGNOSIS — Z308 Encounter for other contraceptive management: Secondary | ICD-10-CM | POA: Diagnosis not present

## 2017-07-14 DIAGNOSIS — E119 Type 2 diabetes mellitus without complications: Secondary | ICD-10-CM | POA: Diagnosis not present

## 2017-07-14 DIAGNOSIS — E7800 Pure hypercholesterolemia, unspecified: Secondary | ICD-10-CM | POA: Diagnosis not present

## 2017-07-14 LAB — BASIC METABOLIC PANEL
BUN: 12 mg/dL (ref 6–23)
CALCIUM: 8.8 mg/dL (ref 8.4–10.5)
CHLORIDE: 109 meq/L (ref 96–112)
CO2: 25 mEq/L (ref 19–32)
CREATININE: 0.79 mg/dL (ref 0.40–1.20)
GFR: 82.03 mL/min (ref >60.00)
Glucose, Bld: 148 mg/dL — ABNORMAL HIGH (ref 70–99)
Potassium: 4 mEq/L (ref 3.5–5.1)
SODIUM: 141 meq/L (ref 135–145)

## 2017-07-14 LAB — CBC WITH DIFFERENTIAL/PLATELET
BASOS ABS: 38 {cells}/uL (ref 0–200)
Basophils Relative: 0.7 %
Eosinophils Absolute: 491 cells/uL (ref 15–500)
Eosinophils Relative: 9.1 %
HCT: 40.5 % (ref 35.0–45.0)
Hemoglobin: 13.6 g/dL (ref 11.7–15.5)
LYMPHS ABS: 2020 {cells}/uL (ref 850–3900)
MCH: 29.6 pg (ref 27.0–33.0)
MCHC: 33.6 g/dL (ref 32.0–36.0)
MCV: 88.2 fL (ref 80.0–100.0)
MPV: 11.8 fL (ref 7.5–12.5)
Monocytes Relative: 7.1 %
NEUTROS PCT: 45.7 %
Neutro Abs: 2468 cells/uL (ref 1500–7800)
PLATELETS: 199 10*3/uL (ref 140–400)
RBC: 4.59 10*6/uL (ref 3.80–5.10)
RDW: 12.9 % (ref 11.0–15.0)
TOTAL LYMPHOCYTE: 37.4 %
WBC mixed population: 383 cells/uL (ref 200–950)
WBC: 5.4 10*3/uL (ref 3.8–10.8)

## 2017-07-14 LAB — HEPATIC FUNCTION PANEL
ALBUMIN: 3.6 g/dL (ref 3.5–5.2)
ALK PHOS: 58 U/L (ref 39–117)
ALT: 21 U/L (ref 0–35)
AST: 17 U/L (ref 0–37)
Bilirubin, Direct: 0.1 mg/dL (ref 0.0–0.3)
TOTAL PROTEIN: 6.6 g/dL (ref 6.0–8.3)
Total Bilirubin: 0.4 mg/dL (ref 0.2–1.2)

## 2017-07-14 LAB — LIPID PANEL
CHOL/HDL RATIO: 4
CHOLESTEROL: 146 mg/dL (ref 0–200)
HDL: 36.1 mg/dL — AB (ref >39.00)
LDL Cholesterol: 78 mg/dL (ref 0–99)
NonHDL: 109.58
TRIGLYCERIDES: 158 mg/dL — AB (ref 0.0–149.0)
VLDL: 31.6 mg/dL (ref 0.0–40.0)

## 2017-07-14 LAB — HEMOGLOBIN A1C: Hgb A1c MFr Bld: 7.2 % — ABNORMAL HIGH (ref 4.6–6.5)

## 2017-07-14 LAB — FERRITIN: Ferritin: 11.3 ng/mL (ref 10.0–291.0)

## 2017-07-14 MED ORDER — MEDROXYPROGESTERONE ACETATE 150 MG/ML IM SUSP
150.0000 mg | Freq: Once | INTRAMUSCULAR | Status: AC
  Administered 2017-07-14: 150 mg via INTRAMUSCULAR

## 2017-07-14 NOTE — Progress Notes (Addendum)
Depo- Provera given in lft deltoid patient voiced no complaints or concerns, patient with in guidelines for Depo.  Reviewed.  Dr Lorin PicketScott

## 2017-07-17 ENCOUNTER — Encounter: Payer: Self-pay | Admitting: Internal Medicine

## 2017-07-18 ENCOUNTER — Ambulatory Visit: Payer: BC Managed Care – PPO

## 2017-07-19 ENCOUNTER — Ambulatory Visit: Payer: BC Managed Care – PPO | Admitting: Internal Medicine

## 2017-07-19 ENCOUNTER — Encounter: Payer: Self-pay | Admitting: Internal Medicine

## 2017-07-19 DIAGNOSIS — E669 Obesity, unspecified: Secondary | ICD-10-CM | POA: Diagnosis not present

## 2017-07-19 DIAGNOSIS — D649 Anemia, unspecified: Secondary | ICD-10-CM

## 2017-07-19 DIAGNOSIS — R110 Nausea: Secondary | ICD-10-CM

## 2017-07-19 DIAGNOSIS — E7800 Pure hypercholesterolemia, unspecified: Secondary | ICD-10-CM

## 2017-07-19 DIAGNOSIS — G4733 Obstructive sleep apnea (adult) (pediatric): Secondary | ICD-10-CM

## 2017-07-19 DIAGNOSIS — I10 Essential (primary) hypertension: Secondary | ICD-10-CM | POA: Diagnosis not present

## 2017-07-19 DIAGNOSIS — K219 Gastro-esophageal reflux disease without esophagitis: Secondary | ICD-10-CM

## 2017-07-19 DIAGNOSIS — R945 Abnormal results of liver function studies: Secondary | ICD-10-CM | POA: Diagnosis not present

## 2017-07-19 DIAGNOSIS — E119 Type 2 diabetes mellitus without complications: Secondary | ICD-10-CM | POA: Diagnosis not present

## 2017-07-19 DIAGNOSIS — R7989 Other specified abnormal findings of blood chemistry: Secondary | ICD-10-CM

## 2017-07-19 NOTE — Patient Instructions (Signed)
Stop trulicity and stop losartan.  Monitor blood pressures and sugars and send in as we discussed.

## 2017-07-19 NOTE — Progress Notes (Signed)
Patient ID: Genavieve Mangiapane, female   DOB: 1965-11-05, 52 y.o.   MRN: [id number]   Subjective:    Patient ID: Madaleine Simmon, female    DOB: March 25, 1966, 52 y.o.   MRN: [id number]  HPI  Patient here for a scheduled follow up.  She reports she has not been feeling well.  States she feels this started when she started taking Trulicity.  Reports nausea and decreased appetite.  Nausea worse in am.  Reports sugars are better and has lost some weight.  She also reports having some intermittent light headedness and occasional headache.  Taking ibuprofen.  Reports minimal constipation.  No abdominal pain.  No chest pain.  Breathing stable.  States am sugars averaging 140-170.     Past Medical History:  Diagnosis Date  . Anemia   . Diabetes mellitus ([place])   . Gastritis   . History of abnormal Pap smear    s/p LEEP, h/o HPV  . Hypercholesterolemia   . Hypertension   . IBS (irritable bowel syndrome)    Past Surgical History:  Procedure Laterality Date  . APPENDECTOMY    . COLONOSCOPY WITH PROPOFOL N/A 11/28/2014   Procedure: COLONOSCOPY WITH PROPOFOL;  Surgeon: Lollie Sails, MD;  Location: [facility] ENDOSCOPY;  Service: Endoscopy;  Laterality: N/A;   Family History  Problem Relation Age of Onset  . Diabetes Paternal Grandmother   . Emphysema Paternal Grandfather   . Breast cancer Sister 50  . Colon cancer Neg Hx    Social History   Socioeconomic History  . Marital status: Single    Spouse name: None  . Number of children: 0  . Years of education: None  . Highest education level: None  Social Needs  . Financial resource strain: None  . Food insecurity - worry: None  . Food insecurity - inability: None  . Transportation needs - medical: None  . Transportation needs - non-medical: None  Occupational History  . None  Tobacco Use  . Smoking status: Never Smoker  . Smokeless tobacco: Never Used  Substance and Sexual Activity  . Alcohol use: No    Alcohol/week: 0.0 oz  .  Drug use: No  . Sexual activity: None  Other Topics Concern  . None  Social History Narrative  . None    Outpatient Encounter Medications as of 07/19/2017  Medication Sig  . BIOTIN PO Take 1 capsule by mouth daily.   . fish oil-omega-3 fatty acids 1000 MG capsule Take 1 g by mouth daily.  Marland Kitchen FLUoxetine (PROZAC) 20 MG capsule TAKE 1 CAPSULE BY MOUTH EVERY DAY  . fluticasone (FLONASE) 50 MCG/ACT nasal spray PLACE 2 SPRAYS INTO THE NOSE DAILY.  . medroxyPROGESTERone (DEPO-PROVERA) 150 MG/ML injection INJECT 1 ML INTRAMUSCULARLY EVERY THREE MONTHS  . metFORMIN (GLUCOPHAGE XR) 500 MG 24 hr tablet Take 1 tablet (500 mg total) by mouth 2 (two) times daily.  . Multiple Vitamin (MULTIVITAMIN) tablet Take 1 tablet by mouth daily.  . ONE TOUCH ULTRA TEST test strip TEST BLOOD SUGAR DAILY  . pantoprazole (PROTONIX) 40 MG tablet TAKE 1 TABLET (40 MG TOTAL) BY MOUTH DAILY.  . pravastatin (PRAVACHOL) 20 MG tablet TAKE 1 TABLET (20 MG TOTAL) BY MOUTH DAILY.  . ranitidine (ZANTAC) 150 MG capsule Take 150 mg by mouth every evening.  . zolpidem (AMBIEN) 5 MG tablet TAKE 1 TABLET BY MOUTH AT BEDTIME AS NEEDED FOR SLEEP  . [DISCONTINUED] losartan (COZAAR) 25 MG tablet TAKE 1 TABLET BY MOUTH DAILY  . [  DISCONTINUED] TRULICITY 1.5 OE/7.0JJ SOPN INJECT INTO THE SKIN ONCE A WEEK.   No facility-administered encounter medications on file as of 07/19/2017.     Review of Systems  Constitutional: Positive for appetite change.       Has lost some weight.    HENT: Negative for congestion and sinus pressure.   Respiratory: Negative for cough, chest tightness and shortness of breath.   Cardiovascular: Negative for chest pain, palpitations and leg swelling.  Gastrointestinal: Negative for abdominal pain, diarrhea and vomiting.       Nausea as outlined.  Minimal constipation.    Genitourinary: Negative for difficulty urinating and dysuria.  Musculoskeletal: Negative for joint swelling and myalgias.  Skin: Negative for  color change and rash.  Neurological: Negative for dizziness, light-headedness and headaches.  Psychiatric/Behavioral: Negative for agitation and dysphoric mood.      Objective:    Physical Exam  Constitutional: She appears well-developed and well-nourished. No distress.  HENT:  Nose: Nose normal.  Mouth/Throat: Oropharynx is clear and moist.  Neck: Neck supple. No thyromegaly present.  Cardiovascular: Normal rate and regular rhythm.  Pulmonary/Chest: Breath sounds normal. No respiratory distress. She has no wheezes.  Abdominal: Soft. Bowel sounds are normal. There is no tenderness.  Musculoskeletal: She exhibits no edema or tenderness.  Lymphadenopathy:    She has no cervical adenopathy.  Skin: No rash noted. No erythema.  Psychiatric: She has a normal mood and affect. Her behavior is normal.    BP 122/78 (BP Location: Left Arm, Patient Position: Sitting, Cuff Size: Normal)   Pulse 82   Temp 98.8 F (37.1 C) (Oral)   Resp 16   Wt 191 lb (86.6 kg)   SpO2 96%   BMI 32.61 kg/m  Wt Readings from Last 3 Encounters:  07/19/17 191 lb (86.6 kg)  03/23/17 194 lb 12.8 oz (88.4 kg)  12/05/16 195 lb (88.5 kg)     Lab Results  Component Value Date   WBC 5.4 07/14/2017   HGB 13.6 07/14/2017   HCT 40.5 07/14/2017   PLT 199 07/14/2017   GLUCOSE 148 (H) 07/14/2017   CHOL 146 07/14/2017   TRIG 158.0 (H) 07/14/2017   HDL 36.10 (L) 07/14/2017   LDLDIRECT 139.0 11/09/2016   LDLCALC 78 07/14/2017   ALT 21 07/14/2017   AST 17 07/14/2017   NA 141 07/14/2017   K 4.0 07/14/2017   CL 109 07/14/2017   CREATININE 0.79 07/14/2017   BUN 12 07/14/2017   CO2 25 07/14/2017   TSH 1.09 03/17/2017   HGBA1C 7.2 (H) 07/14/2017   MICROALBUR 2.0 (H) 03/17/2017       Assessment & Plan:   Problem List Items Addressed This Visit    Anemia    Has had colonoscopy and EGD previous as outlined.  Had f/u colonoscopy 11/2014 - diverticulosis.  Recommended f/u colonoscopy in 5 years.  hgb wnl -  07/14/17.        Diabetes mellitus ([place])    Having increased nausea and decreased po intake.  Has lost some weight.  Doing better with her diet.  Will stop trulicity.  Have her check sugars bid.  Send in readings over the next few weeks.  Low carb diet and exercise.  Follow met b and a1c.        Elevated LFTs    Discussed diet and exercise.  Weight loss.  Follow liver function tests.        GERD (gastroesophageal reflux disease)    On protonix.  No acid reflux reported.        Hypercholesterolemia    On pravastatin.  Low cholesterol diet and exercise.  Follow lipid panel and liver function tests.        Hypertension    Blood pressure decreased some with change in positions.  Will stop losartan.  Follow pressures.  Follow metabolic panel.        Nausea    With nausea and decreased appetite.  Also with light headedness.  Will stop trulicity and losartan as outlined.  Follow sugars and pressures.  See if symptoms resolve.  Follow sugars and adjust meds as needed.        Obesity (BMI 30-39.9)    Diet and exercise.  Follow.        Obstructive sleep apnea    CPAP - continue.            Einar Pheasant, MD

## 2017-07-22 ENCOUNTER — Encounter: Payer: Self-pay | Admitting: Internal Medicine

## 2017-07-22 DIAGNOSIS — R112 Nausea with vomiting, unspecified: Secondary | ICD-10-CM | POA: Insufficient documentation

## 2017-07-22 DIAGNOSIS — R110 Nausea: Secondary | ICD-10-CM | POA: Insufficient documentation

## 2017-07-22 NOTE — Assessment & Plan Note (Signed)
On pravastatin.  Low cholesterol diet and exercise.  Follow lipid panel and liver function tests.   

## 2017-07-22 NOTE — Assessment & Plan Note (Signed)
CPAP continue 

## 2017-07-22 NOTE — Assessment & Plan Note (Signed)
With nausea and decreased appetite.  Also with light headedness.  Will stop trulicity and losartan as outlined.  Follow sugars and pressures.  See if symptoms resolve.  Follow sugars and adjust meds as needed.

## 2017-07-22 NOTE — Assessment & Plan Note (Signed)
On protonix.  No acid reflux reported.  

## 2017-07-22 NOTE — Assessment & Plan Note (Signed)
Has had colonoscopy and EGD previous as outlined.  Had f/u colonoscopy 11/2014 - diverticulosis.  Recommended f/u colonoscopy in 5 years.  hgb wnl - 07/14/17.

## 2017-07-22 NOTE — Assessment & Plan Note (Signed)
Having increased nausea and decreased po intake.  Has lost some weight.  Doing better with her diet.  Will stop trulicity.  Have her check sugars bid.  Send in readings over the next few weeks.  Low carb diet and exercise.  Follow met b and a1c.

## 2017-07-22 NOTE — Assessment & Plan Note (Signed)
Discussed diet and exercise.  Weight loss.  Follow liver function tests.   

## 2017-07-22 NOTE — Assessment & Plan Note (Signed)
Diet and exercise.  Follow.  

## 2017-07-22 NOTE — Assessment & Plan Note (Signed)
Blood pressure decreased some with change in positions.  Will stop losartan.  Follow pressures.  Follow metabolic panel.

## 2017-07-25 ENCOUNTER — Other Ambulatory Visit: Payer: Self-pay | Admitting: Internal Medicine

## 2017-07-28 ENCOUNTER — Encounter: Payer: Self-pay | Admitting: Internal Medicine

## 2017-08-04 ENCOUNTER — Encounter: Payer: Self-pay | Admitting: Internal Medicine

## 2017-08-05 ENCOUNTER — Other Ambulatory Visit: Payer: Self-pay | Admitting: Internal Medicine

## 2017-08-06 NOTE — Telephone Encounter (Signed)
Pt needs an appt scheduled with Devota Pacearoline Welles for diabetes management.  Needs a 4:00 appt if possible.  See her my chart message.  Thanks

## 2017-08-07 NOTE — Telephone Encounter (Signed)
Last OV 07/19/2017 Next OV 09/04/2017 Last refill 05/18/2017

## 2017-08-08 ENCOUNTER — Other Ambulatory Visit: Payer: Self-pay | Admitting: Internal Medicine

## 2017-08-08 ENCOUNTER — Encounter: Payer: Self-pay | Admitting: Internal Medicine

## 2017-08-08 NOTE — Telephone Encounter (Signed)
[facility] Rx [facility]

## 2017-08-09 ENCOUNTER — Ambulatory Visit
Admission: RE | Admit: 2017-08-09 | Discharge: 2017-08-09 | Disposition: A | Discharge status: Home / Self Care | Payer: BC Managed Care – PPO | Source: Ambulatory Visit | Attending: Internal Medicine | Admitting: Internal Medicine

## 2017-08-09 DIAGNOSIS — Z1231 Encounter for screening mammogram for malignant neoplasm of breast: Secondary | ICD-10-CM | POA: Diagnosis not present

## 2017-08-09 DIAGNOSIS — R928 Other abnormal and inconclusive findings on diagnostic imaging of breast: Secondary | ICD-10-CM | POA: Insufficient documentation

## 2017-08-10 ENCOUNTER — Other Ambulatory Visit: Payer: Self-pay | Admitting: Internal Medicine

## 2017-08-10 DIAGNOSIS — N6320 Unspecified lump in the left breast, unspecified quadrant: Secondary | ICD-10-CM

## 2017-08-10 DIAGNOSIS — R928 Other abnormal and inconclusive findings on diagnostic imaging of breast: Secondary | ICD-10-CM

## 2017-08-11 ENCOUNTER — Encounter: Payer: Self-pay | Admitting: Internal Medicine

## 2017-08-14 ENCOUNTER — Ambulatory Visit
Admission: RE | Admit: 2017-08-14 | Discharge: 2017-08-14 | Disposition: A | Discharge status: Home / Self Care | Payer: BC Managed Care – PPO | Source: Ambulatory Visit | Attending: Internal Medicine | Admitting: Internal Medicine

## 2017-08-14 ENCOUNTER — Encounter: Payer: Self-pay | Admitting: Pharmacist

## 2017-08-14 ENCOUNTER — Ambulatory Visit (INDEPENDENT_AMBULATORY_CARE_PROVIDER_SITE_OTHER): Payer: BC Managed Care – PPO | Admitting: Pharmacist

## 2017-08-14 VITALS — BP 136/98 | HR 80 | Ht 63.0 in | Wt 194.0 lb

## 2017-08-14 DIAGNOSIS — R928 Other abnormal and inconclusive findings on diagnostic imaging of breast: Secondary | ICD-10-CM | POA: Insufficient documentation

## 2017-08-14 DIAGNOSIS — N6320 Unspecified lump in the left breast, unspecified quadrant: Secondary | ICD-10-CM

## 2017-08-14 DIAGNOSIS — N6323 Unspecified lump in the left breast, lower outer quadrant: Secondary | ICD-10-CM | POA: Insufficient documentation

## 2017-08-14 DIAGNOSIS — E119 Type 2 diabetes mellitus without complications: Secondary | ICD-10-CM | POA: Diagnosis not present

## 2017-08-14 DIAGNOSIS — N6002 Solitary cyst of left breast: Secondary | ICD-10-CM | POA: Insufficient documentation

## 2017-08-14 MED ORDER — EMPAGLIFLOZIN 10 MG PO TABS: 10.0000 mg | ORAL_TABLET | Freq: Every day | ORAL | 2 refills | Status: DC

## 2017-08-14 MED ORDER — GLUCOSE BLOOD VI STRP: ORAL_STRIP | 12 refills | Status: DC

## 2017-08-14 MED ORDER — BAYER CONTOUR MONITOR DEVI: 0 refills | Status: DC

## 2017-08-14 MED ORDER — MICROLET NEXT LANCING DEVICE MISC: 1.0000 | Freq: Every day | 0 refills | Status: AC

## 2017-08-14 MED ORDER — MICROLET LANCETS MISC: 1.0000 | Freq: Three times a day (TID) | 11 refills | Status: AC

## 2017-08-14 NOTE — Progress Notes (Addendum)
S:     Chief Complaint  Patient presents with  . Medication Management    Diabetes    Patient arrives in good spirits, ambulating without assistance.  Presents for diabetes evaluation, education, and management at the request of Dr. Lorin Picket (referred on 3/2- patient email). Last seen by primary care provider on 07/19/17 - at that time Trulicity was stopped 2/2 n/v, weight loss, and hypoglycemia. Additionally, losartan was stopped with well controlled BP on losartan.   Today, patient reports she is motivated to control diabetes. Reports she has lost 12 pounds in the last year with decreased appetite and nausea, resolved since off Trulicity.  Reports that she is watching portions now. States she has had diabetes education previously. Reports significant stress as she is undergoing workup for mass found in breast. States she checks blood sugars at least twice a day - fasting or 2hrs post-prandials. States her CBG meter is "really old", presents for review today. Reports she has taken metformin in the past at 1000 mg BID but was unable to tolerate with GI upset after staying on it for 3 months.  Patient reports Diabetes was diagnosed in 2015.   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 Lives alone, eats out.   Insurance coverage/medication affordability: insurance, meds are afforable  Patient reports adherence with medications.  Current diabetes medications include: metformin XR 500 mg BID Current hypertension medications include: none  Patient denies hypoglycemic events.  Patient reported dietary habits: Eats 2-3 meals/day Breakfast: country ham biscuit Lunch:[employer] (3-item sampler) Dinner:leftovers from lunch OR eats out once a week Snacks: not many snacks Drinks: 2/3 unsweet-1/3 sweet tea, skim milk (2 gallons/week)  Patient reported exercise habits: none at present   Patient denies nocturia.  Patient denies pain/burning on urination. Denies frequent  UTIs, occasional yeast infections (1x/year) Patient denies neuropathy. Patient reports visual changes. Some blurriness which changes throughout the day.   Patient reports self foot exams. No issues.  Patient denies personal or family h/o medullary thyroid carcinoma or MEN2 Cancer.  O:  Physical Exam  Constitutional: She appears well-developed and well-nourished.    Review of Systems  All other systems reviewed and are negative.  110-130s/70s-80 - home wrist cuff hx Today's home BP cuff read 127/83 in office   Lab Results  Component Value Date   HGBA1C 7.2 (H) 07/14/2017   Vitals:   08/14/17 1157 08/14/17 1209  BP: (!) 134/104 (!) 136/98  Pulse:      Lipid Panel     Component Value Date/Time   CHOL 146 07/14/2017 0753   TRIG 158.0 (H) 07/14/2017 0753   HDL 36.10 (L) 07/14/2017 0753   CHOLHDL 4 07/14/2017 0753   VLDL 31.6 07/14/2017 0753   LDLCALC 78 07/14/2017 0753   LDLDIRECT 139.0 11/09/2016 0836     CBGs: 180s-250s, excursions to 104 and 300s A/P: #Diabetes longstanding currently uncontrolled per CBG report. Patient denies hypoglycemic events and is able to verbalize appropriate hypoglycemia management plan. Patient reports adherence with medication. Control is suboptimal due to dietary indiscretion, sedentary lifestyle, insulin resistance. Unable to tolerate Trulicity, on max tolerable dose of metformin.  - Start Jardiance 10 mg daily.  - Counseled on sick day rules for Jardiance.  - Continue metformin XR 500 mg BID - Extensively discussed dietary and lifestyle modifications to contribute to glycemic control.  - Next A1C anticipated May 2019.    #ASCVD risk - primary prevention in patient aged 50-75 with DM, baseline  LDL 70-189, multiple ASCVD risk factors- moderate intensity statin indicated  - Continued pravastatin 20 mg daily.     #Hypertension longstanding currently uncontrolled in setting of no antihypertensive medications. Home wrist cuff inaccurate compared  to manual BP taken in office. Control is suboptimal due to stress, dietary indiscretion, sedentary lifestyle - No medication changes at this time aside from [place]. Anticipate that Jardiance will contribute to blood pressure control.   Written patient instructions provided.  Total time in face to face counseling 60 minutes.    Follow up in Pharmacist Clinic Visit 3-4 weeks   Allena Katzaroline E Welles, Pharm.D., BCPS, CPP PGY2 Ambulatory Care Pharmacy Resident Phone: [phone]   Reviewed above information.  Agree with assessment and plan.    Dr Lorin PicketScott

## 2017-08-14 NOTE — Assessment & Plan Note (Signed)
#  Diabetes longstanding currently uncontrolled per CBG report. Patient denies hypoglycemic events and is able to verbalize appropriate hypoglycemia management plan. Patient reports adherence with medication. Control is suboptimal due to dietary indiscretion, sedentary lifestyle, insulin resistance. Unable to tolerate Trulicity, on max tolerable dose of metformin.  - Start Jardiance 10 mg daily.  - Counseled on sick day rules for Jardiance.  - Continue metformin XR 500 mg BID - Extensively discussed dietary and lifestyle modifications to contribute to glycemic control.  - Next A1C anticipated May 2019.    #ASCVD risk - primary prevention in patient aged 50-75 with DM, baseline LDL 70-189, multiple ASCVD risk factors- moderate intensity statin indicated  - Continued pravastatin 20 mg daily.

## 2017-08-14 NOTE — Assessment & Plan Note (Signed)
#  Hypertension longstanding currently uncontrolled in setting of no antihypertensive medications. Home wrist cuff inaccurate compared to manual BP taken in office. Control is suboptimal due to stress, dietary indiscretion, sedentary lifestyle - No medication changes at this time aside from [place]. Anticipate that Jardiance will contribute to blood pressure control.

## 2017-08-14 NOTE — Patient Instructions (Addendum)
Thanks for coming to see me today! We're going to tackle this. We're starting with INTAKE and PORTION.  Limit milk - consider trying an almond or coconut milk option. Look for the unsweetened options. Try to stick to 2-3 servings of carbohydrates per meal.   Start Jardiance 10 mg daily. If you are sick/dehydrated/throwing up DO NOT TAKE this medication.   If you experience any yeast infections or UTIs, please call.   My number is [phone].   I want to see you back in 3-4 weeks.

## 2017-08-15 ENCOUNTER — Encounter: Payer: Self-pay | Admitting: Internal Medicine

## 2017-08-15 DIAGNOSIS — R928 Other abnormal and inconclusive findings on diagnostic imaging of breast: Secondary | ICD-10-CM

## 2017-08-16 NOTE — Telephone Encounter (Signed)
Order placed for surgery referral.  

## 2017-08-21 ENCOUNTER — Inpatient Hospital Stay (INDEPENDENT_AMBULATORY_CARE_PROVIDER_SITE_OTHER): Payer: BC Managed Care – PPO

## 2017-08-21 ENCOUNTER — Ambulatory Visit: Payer: Self-pay | Admitting: General Surgery

## 2017-08-21 ENCOUNTER — Ambulatory Visit: Payer: BC Managed Care – PPO | Admitting: General Surgery

## 2017-08-21 ENCOUNTER — Encounter: Payer: Self-pay | Admitting: General Surgery

## 2017-08-21 VITALS — BP 142/80 | HR 78 | Ht 63.0 in | Wt 194.0 lb

## 2017-08-21 DIAGNOSIS — N6323 Unspecified lump in the left breast, lower outer quadrant: Secondary | ICD-10-CM | POA: Diagnosis not present

## 2017-08-21 DIAGNOSIS — N6321 Unspecified lump in the left breast, upper outer quadrant: Secondary | ICD-10-CM | POA: Diagnosis not present

## 2017-08-21 NOTE — Patient Instructions (Signed)
Follow up appointment to be announced.  

## 2017-08-21 NOTE — Progress Notes (Signed)
Patient ID: Shauniece [name], female   DOB: Oct 15, 1965, 52 y.o.   MRN: [id number]  Chief Complaint  Patient presents with  . Other    HPI Anne [name] is a 52 y.o. female who presents for a breast evaluation. The most recent mammogram was done on 08/09/2017 and added views and left breast ultrasound on 08/14/2017.  Patient does perform regular self breast checks and gets regular mammograms done.  Mom, Anne [name] is present.  Works at [employer] for [employer].  HPI  Past Medical History:  Diagnosis Date  . Anemia   . Diabetes mellitus ([place])   . Gastritis   . History of abnormal Pap smear    s/p LEEP, h/o HPV  . Hypercholesterolemia   . Hypertension   . IBS (irritable bowel syndrome)     Past Surgical History:  Procedure Laterality Date  . APPENDECTOMY    . COLONOSCOPY WITH PROPOFOL N/A 11/28/2014   Procedure: COLONOSCOPY WITH PROPOFOL;  Surgeon: Anne Sails, MD;  Location: [facility] ENDOSCOPY;  Service: Endoscopy;  Laterality: N/A;    Family History  Problem Relation Age of Onset  . Diabetes Paternal Grandmother   . Emphysema Paternal Grandfather   . Breast cancer Sister 43  . Colon cancer Neg Hx     Social History Social History   Tobacco Use  . Smoking status: Never Smoker  . Smokeless tobacco: Never Used  Substance Use Topics  . Alcohol use: No    Alcohol/week: 0.0 oz  . Drug use: No    Allergies  Allergen Reactions  . Trulicity [Dulaglutide] Nausea And Vomiting  . Januvia [Sitagliptin] Other (See Comments)    Elbow/Knee/Jaw joint pain, irritability and bloating    Current Outpatient Medications  Medication Sig Dispense Refill  . BIOTIN PO Take 1 capsule by mouth daily.     . Blood Glucose Monitoring Suppl (CONTOUR BLOOD GLUCOSE SYSTEM) DEVI Use to test blood sugar up to three times daily 1 Device 0  . empagliflozin (JARDIANCE) 10 MG TABS tablet Take 10 mg by mouth daily. 30 tablet 2  . fish oil-omega-3 fatty acids 1000 MG capsule Take 1 g by mouth daily.    Marland Kitchen  FLUoxetine (PROZAC) 20 MG capsule TAKE 1 CAPSULE BY MOUTH EVERY DAY 90 capsule 0  . fluticasone (FLONASE) 50 MCG/ACT nasal spray PLACE 2 SPRAYS INTO THE NOSE DAILY. 16 g 11  . glucose blood (BAYER CONTOUR TEST) test strip Use as instructed to test blood sugar three times daily 100 each 12  . Lancet Devices (MICROLET NEXT LANCING DEVICE) MISC 1 each by Does not apply route daily. 1 each 0  . medroxyPROGESTERone (DEPO-PROVERA) 150 MG/ML injection INJECT 1 ML INTRAMUSCULARLY EVERY THREE MONTHS 1 mL 3  . metFORMIN (GLUCOPHAGE XR) 500 MG 24 hr tablet Take 1 tablet (500 mg total) by mouth 2 (two) times daily. 180 tablet 1  . MICROLET LANCETS MISC 1 each by Does not apply route 3 (three) times daily. 100 each 11  . Multiple Vitamin (MULTIVITAMIN) tablet Take 1 tablet by mouth daily.    . pantoprazole (PROTONIX) 40 MG tablet TAKE 1 TABLET (40 MG TOTAL) BY MOUTH DAILY. 90 tablet 2  . pravastatin (PRAVACHOL) 20 MG tablet TAKE 1 TABLET (20 MG TOTAL) BY MOUTH DAILY. 90 tablet 0  . ranitidine (ZANTAC) 150 MG capsule Take 150 mg by mouth every evening.    . zolpidem (AMBIEN) 5 MG tablet TAKE 1 TABLET BY MOUTH EVERY DAY AT BEDTIME AS NEEDED FOR SLEEP 30  tablet 1   No current facility-administered medications for this visit.     Review of Systems Review of Systems  Constitutional: Negative.   Respiratory: Negative.   Cardiovascular: Negative.     Blood pressure (!) 142/80, pulse 78, height 5' 3"  (1.6 m), weight 194 lb (88 kg), head circumference 12" (30.5 cm).  Physical Exam Physical Exam  Constitutional: She is oriented to person, place, and time. She appears well-developed and well-nourished.  Eyes: Conjunctivae are normal. No scleral icterus.  Neck: Neck supple.  Cardiovascular: Normal rate, regular rhythm and normal heart sounds.  Pulmonary/Chest: Effort normal and breath sounds normal. Right breast exhibits no inverted nipple, no mass, no nipple discharge, no skin change and no tenderness. Left  breast exhibits no inverted nipple, no mass, no nipple discharge, no skin change and no tenderness.  Lymphadenopathy:    She has no cervical adenopathy.    She has no axillary adenopathy.  Neurological: She is alert and oriented to person, place, and time.  Skin: Skin is warm and dry.    Data Reviewed Mammograms of 2016, 2018 and 2019 reviewed.  Left breast ultrasound of 2019 reviewed.  Multiple cystic lesions in the lower outer quadrant of the left breast.  Indeterminate lesion in the lower outer quadrant of the left breast.  BI-RADS-4.  Ultrasound examination of the left breast was completed to determine if core biopsy could be completed today.  The multiple simple cysts in the 4-5 o'clock position of the breast near the periphery at 5 cm from the nipple are identified.  The indeterminate area reported on her earlier ultrasound from last week at the 4 cm mark from the nipple at the 430 o'clock position showed no clear architectural distortion or mass-effect.  There was a faint area of shadowing but no clear associated nodular density is imaged on the [place] study.  Inconclusive ultrasound today.  Assessment    Abnormal imaging, likely desiccated cyst, less likely malignancy.  Family history of older sister with breast cancer, BRCA negative.    Plan   I have recommended the patient have a ultrasound-guided core biopsy at the [facility] in the near future.    HPI, Physical Exam, Assessment and Plan have been scribed under the direction and in the presence of Anne Ard, MD.  Anne [name], CMA  I have completed the exam and reviewed the above documentation for accuracy and completeness.  I agree with the above.  Haematologist has been used and any errors in dictation or transcription are unintentional.  Anne [name], M.D., F.A.C.S.  Anne [name] 08/21/2017, 10:16 AM  Patient to be scheduled for a left breast ultrasound guided biopsy at the [facility] to be completed by the radiologist. The patient will be contacted by the folks in mammography to arrange a date and time. She is aware to call the office if she has further questions.   Dominga Ferry, CMA

## 2017-08-24 ENCOUNTER — Encounter: Payer: Self-pay | Admitting: Internal Medicine

## 2017-08-25 ENCOUNTER — Ambulatory Visit
Admission: RE | Admit: 2017-08-25 | Discharge: 2017-08-25 | Disposition: A | Discharge status: Home / Self Care | Payer: BC Managed Care – PPO | Source: Ambulatory Visit | Attending: General Surgery | Admitting: General Surgery

## 2017-08-25 DIAGNOSIS — N6082 Other benign mammary dysplasias of left breast: Secondary | ICD-10-CM | POA: Insufficient documentation

## 2017-08-25 DIAGNOSIS — N6323 Unspecified lump in the left breast, lower outer quadrant: Secondary | ICD-10-CM | POA: Diagnosis not present

## 2017-08-25 HISTORY — PX: BREAST BIOPSY: SHX20

## 2017-08-27 NOTE — Telephone Encounter (Signed)
Anne [name], she wanted me to forward her sugars to you.  They are attached for your review.    [name].

## 2017-08-28 ENCOUNTER — Ambulatory Visit: Payer: BC Managed Care – PPO | Admitting: Pharmacist

## 2017-08-28 LAB — SURGICAL PATHOLOGY

## 2017-08-29 ENCOUNTER — Telehealth: Payer: Self-pay | Admitting: General Surgery

## 2017-08-29 NOTE — Telephone Encounter (Signed)
Contacted the patient to confirm she had received results from the u/s guided biopsy completed [place] [facility] last week. She reported that she had received a call from mammography. (No note in Epic re: that call). Biopsy results: normal breast tissue, no atypia. No special f/u required.  Should resume annual screening mammograms w/ Dr. Lorin PicketScott in March 2020.

## 2017-09-04 ENCOUNTER — Ambulatory Visit: Payer: BC Managed Care – PPO | Admitting: Internal Medicine

## 2017-09-06 ENCOUNTER — Telehealth: Payer: Self-pay | Admitting: Pharmacist

## 2017-09-06 NOTE — Telephone Encounter (Signed)
Patient calls complaining of HA x several days, potentially weeks per patient, which she attributes to [place]ard[place]. HA has caused her to call out of work. She states that she has decreased sweet tea intake and has switched to water. She also reports she is eating healthier (egg white delight instead of ham biscuit, salads from mcdonalds) and she has increased her exercise (yard work, walking stairs, taking longer routes).   CBGs have been generally - 130s-210s (improved)  Blood pressures - 100s-110s/60s-70s, highest = 130/83 mmHg  Advised patient hold Jardiance x2 days and take note if HA improves. Asked patient to carefully watch diet and walk on the days that she is off of Jardiance. HAs may be attributable to medication or decreased BP+CBGs and decreased caffeine intake. Patient to call or email me with results of holding Jardiance. Provided patient my email address. Will follow up with patient on Monday over the phone.   Allena Katzaroline E Jesselle Laflamme, Pharm.D., BCPS PGY2 Ambulatory Care Pharmacy Resident Phone: [phone]

## 2017-09-11 ENCOUNTER — Telehealth: Payer: Self-pay | Admitting: Pharmacist

## 2017-09-11 MED ORDER — BAYER CONTOUR MONITOR DEVI: 0 refills | Status: DC

## 2017-09-11 MED ORDER — GLUCOSE BLOOD VI STRP: ORAL_STRIP | 12 refills | Status: DC

## 2017-09-11 NOTE — Telephone Encounter (Signed)
Received email communication from patient 09/11/2017 8:36 AM:   [name] morning Anne [name] you had a great weekend!  Here's my update on health/Jardiance.    I temp. stopped taking Jard. on Thu. 4/4 as we discussed.   I have 8 tablets left and can request this prescription be refilled today.    I still had a pretty back headache on Friday.  I had some headaches kind of dull on Sat. and Sun.  Only when I felt hungry or exhausted they got worse.     I did major work in my yard on sat and sun.   6 hrs, both days.    Not sure how to mark calories burned on those days LOL      I'm feeling great, very sore!   I'm drinking so much water, eating better, had  caff coffee (not a huge coff drinker),  & exercising.    I have a little bit of a headache this morning but just enough to know it's there.      I'm beginning to think this is caffeine related, possibly pollen, and all my numbers dropping and my body getting used to the new lower numbers as you said.     I am willing to continue to take Jardiance if you agree that makes sense.  I will also leave it to you to determine when to up the dosage.   If you do decide to do this, please call in a new prescription today.         BTW, can you please update my medicine list to reflect the new device I have (OneTouch Ultra2 system)?  So here are my new numbers on the temp. break from [place]..  SUGAR 4/8         5:58a     178 4/7         9:14p     188 4/7         4:49p     135 4/7         6:33a     163 4/6         9:09a     227 4/6         4:58p     141 4/6         8:20a     169 4/5         10:12a   168 4/5         5:55a     172 4/4         9:09p     156 4/4         4:27p     151 4/4         5:32a     145   BLOOD PRESSURE 4/8         4:57a     110/73 4/7         8:13p     115/66 4/7         3:54p     108/66 4/7         5:35a     112/70 4/6         8:10p     104/69 4/6         7:18a     124/77 4/5         4:55a     130/87 4/4         8:09p  110/73 4/4         3:27p     112/77 4/4         4:31a     108/70  Thanks for your review and consideration.   Let me know what the next steps will be.   Also, please share with Dr. Lorin PicketScott.   Feel free to call when you can, [phone].  Anne [name], Anne [name]   -----------------------------------  Called patient 09/11/2017 12:34 PM  Agree with her assessment - Asked her to restart Jardiance 10 mg daily. Will leave on lower dose for now and prolong time to titration. Patient has follow up with me on 4/15. Will re-evaluate then.   Anne [name], Pharm.D., BCPS PGY2 Ambulatory Care Pharmacy Resident Phone: [phone]

## 2017-09-18 ENCOUNTER — Encounter: Payer: Self-pay | Admitting: Pharmacist

## 2017-09-18 ENCOUNTER — Ambulatory Visit (INDEPENDENT_AMBULATORY_CARE_PROVIDER_SITE_OTHER): Payer: BC Managed Care – PPO | Admitting: Pharmacist

## 2017-09-18 VITALS — BP 124/86 | HR 61 | Ht 63.0 in | Wt 192.0 lb

## 2017-09-18 DIAGNOSIS — E119 Type 2 diabetes mellitus without complications: Secondary | ICD-10-CM | POA: Diagnosis not present

## 2017-09-18 NOTE — Patient Instructions (Signed)
Thanks for coming to see me! You have clearly been working hard! Keep it up.   No changes to your medications today. Follow up with Dr. Lorin PicketScott, then touch base with me to decide what we need to do for follow up.

## 2017-09-18 NOTE — Progress Notes (Addendum)
    S:     Chief Complaint  Patient presents with  . Medication Management    Diabetes    Patient arrives in good spirits, ambulating without assistance.  Presents for diabetes evaluation, education, and management at the request of Dr. Lorin PicketScott (referred on 3/2- patient email). Last seen by primary care provider on 07/19/17 - at that time Trulicity was stopped 2/2 n/v, weight loss, and hypoglycemia. Additionally, losartan was stopped with well controlled BP on losartan. Last seen in Rx clinic on 08/14/2017 - at that time, jardiance was started at 10 mg daily.   Today, patient reports she is feeling well overall. Has been tracking diet and exercise on [employer]. Brings in BP wrist cuff and CBG meter for review. Reports headache has gone away. Patient is very encouraged by her weight loss and glycemic and BP control.  Insurance coverage/medication affordability: BCBS via employer - meds affordable at this time.  Patient reports adherence with medications.  Current diabetes medications include: Metformin XR 500 mg BID, Jardiance 10 mg daily  Current hypertension medications include: none  Patient denies hypoglycemic events.  Patient reported dietary habits: Eats 3 meals/day Breakfast:[employer] Lunch:[employer] or bringing her lunch to work Dinner:leftovers from lunch Drinks:milk (watching serving sizes)  Patient reported exercise habits: yard work, has not started treadmill yet. Started using stairs and doing laps around the building.    O:  Physical Exam  Constitutional: She appears well-developed and well-nourished.     Review of Systems  All other systems reviewed and are negative.    Lab Results  Component Value Date   HGBA1C 7.2 (H) 07/14/2017   Vitals:   09/18/17 1644 09/18/17 1649  BP: 124/90 124/86  Pulse: 61     Lipid Panel     Component Value Date/Time   CHOL 146 07/14/2017 0753   TRIG 158.0 (H) 07/14/2017 0753   HDL 36.10 (L) 07/14/2017 0753   CHOLHDL 4  07/14/2017 0753   VLDL 31.6 07/14/2017 0753   LDLCALC 78 07/14/2017 0753   LDLDIRECT 139.0 11/09/2016 0836    CBGs: 120-170s, excursions 220s BP cuff: 100s-120s/70s-80s  A/P: #Diabetes longstanding currently with improved control per CBG meter review with occasional excursions in fasting state. Patient denies hypoglycemic events and is able to verbalize appropriate hypoglycemia management plan. Patient reports adherence with medication. Control is suboptimal due to dietary indiscretion, sedentary lifestyle, insulin resistance. Unable to tolerate Trulicity, on max tolerable dose of metformin, tolerating addition of Jardiance well.  - Continue Jardiance 10 mg daily.  - Counseled on sick day rules for Jardiance.  - Continue metformin XR 500 mg BID. - Next A1C anticipated May 2019.    #ASCVD risk - primary prevention in patient aged 140-75 with DM, baseline LDL 70-189, multiple ASCVD risk factors- moderate intensity statin indicated  - Continued pravastatin 20 mg daily.     #Hypertension longstanding currently controlled in setting of no antihypertensive medications. Home wrist cuff historically inaccurate compared to manual BP taken in office however has controlled readings today.  - No medications at this time.   Written patient instructions provided.  Total time in face to face counseling 30 minutes.    Follow up in Pharmacist Clinic Visit as needed - to follow up with Dr. Lorin PicketScott next.     Anne [name], Pharm.D., BCPS, CPP PGY2 Ambulatory Care Pharmacy Resident Phone: [phone]   Reviewed above information.  Agree with assessment and plan.    Dr Lorin PicketScott

## 2017-09-19 NOTE — Assessment & Plan Note (Signed)
#  Diabetes longstanding currently with improved control per CBG meter review with occasional excursions in fasting state. Patient denies hypoglycemic events and is able to verbalize appropriate hypoglycemia management plan. Patient reports adherence with medication. Control is suboptimal due to dietary indiscretion, sedentary lifestyle, insulin resistance. Unable to tolerate Trulicity, on max tolerable dose of metformin, tolerating addition of Jardiance well.  - Continue Jardiance 10 mg daily.  - Counseled on sick day rules for Jardiance.  - Continue metformin XR 500 mg BID. - Next A1C anticipated May 2019.

## 2017-09-19 NOTE — Assessment & Plan Note (Signed)
#  ASCVD risk - primary prevention in patient aged 50-75 with DM, baseline LDL 70-189, multiple ASCVD risk factors- moderate intensity statin indicated  - Continued pravastatin 20 mg daily.     #Hypertension longstanding currently controlled in setting of no antihypertensive medications. Home wrist cuff historically inaccurate compared to manual BP taken in office however has controlled readings today.  - No medications at this time.

## 2017-10-02 ENCOUNTER — Ambulatory Visit: Payer: BC Managed Care – PPO | Admitting: Pharmacist

## 2017-10-05 ENCOUNTER — Ambulatory Visit: Payer: BC Managed Care – PPO

## 2017-10-06 ENCOUNTER — Ambulatory Visit: Payer: BC Managed Care – PPO | Admitting: Internal Medicine

## 2017-10-06 VITALS — BP 136/86 | HR 75 | Temp 98.1°F | Resp 18 | Wt 189.2 lb

## 2017-10-06 DIAGNOSIS — R945 Abnormal results of liver function studies: Secondary | ICD-10-CM

## 2017-10-06 DIAGNOSIS — R7989 Other specified abnormal findings of blood chemistry: Secondary | ICD-10-CM

## 2017-10-06 DIAGNOSIS — E119 Type 2 diabetes mellitus without complications: Secondary | ICD-10-CM

## 2017-10-06 DIAGNOSIS — E669 Obesity, unspecified: Secondary | ICD-10-CM

## 2017-10-06 DIAGNOSIS — Z309 Encounter for contraceptive management, unspecified: Secondary | ICD-10-CM

## 2017-10-06 DIAGNOSIS — N6323 Unspecified lump in the left breast, lower outer quadrant: Secondary | ICD-10-CM | POA: Diagnosis not present

## 2017-10-06 DIAGNOSIS — E7800 Pure hypercholesterolemia, unspecified: Secondary | ICD-10-CM | POA: Diagnosis not present

## 2017-10-06 DIAGNOSIS — I10 Essential (primary) hypertension: Secondary | ICD-10-CM | POA: Diagnosis not present

## 2017-10-06 DIAGNOSIS — G4733 Obstructive sleep apnea (adult) (pediatric): Secondary | ICD-10-CM | POA: Diagnosis not present

## 2017-10-06 DIAGNOSIS — D649 Anemia, unspecified: Secondary | ICD-10-CM

## 2017-10-06 DIAGNOSIS — M25521 Pain in right elbow: Secondary | ICD-10-CM

## 2017-10-06 DIAGNOSIS — K219 Gastro-esophageal reflux disease without esophagitis: Secondary | ICD-10-CM | POA: Diagnosis not present

## 2017-10-06 MED ORDER — MEDROXYPROGESTERONE ACETATE 150 MG/ML IM SUSP
150.0000 mg | Freq: Once | INTRAMUSCULAR | Status: AC
  Administered 2017-10-06: 150 mg via INTRAMUSCULAR

## 2017-10-06 NOTE — Progress Notes (Signed)
Patient ID: Anne [name], female   DOB: 07/07/1965, 52 y.o.   MRN: [id number]   Subjective:    Patient ID: Anne [name], female    DOB: 12-04-1965, 52 y.o.   MRN: [id number]  HPI  Patient here for a scheduled follow up.  Recently started Jardiance.  Doing well with this.  AM sugars averaging 120-140s.  Trying to watch her diet.  Has lost some weight.  Starting to exercise more.  No chest pain.  No sob.  No acid reflux.  No abdominal pain.  Bowels moving.  No urine change.  Does report discomfort in her right elbow.  Has been doing a lot of physical labor.  Feels aggravated with shoveling, etc.     Past Medical History:  Diagnosis Date  . Anemia   . Diabetes mellitus ([place])   . Gastritis   . History of abnormal Pap smear    s/p LEEP, h/o HPV  . Hypercholesterolemia   . Hypertension   . IBS (irritable bowel syndrome)    Past Surgical History:  Procedure Laterality Date  . APPENDECTOMY    . BREAST BIOPSY Left 08/25/2017   left breast biopsy path pending  . COLONOSCOPY WITH PROPOFOL N/A 11/28/2014   Procedure: COLONOSCOPY WITH PROPOFOL;  Surgeon: Lollie Sails, MD;  Location: [facility] ENDOSCOPY;  Service: Endoscopy;  Laterality: N/A;   Family History  Problem Relation Age of Onset  . Diabetes Paternal Grandmother   . Emphysema Paternal Grandfather   . Breast cancer Sister 71  . Colon cancer Neg Hx    Social History   Socioeconomic History  . Marital status: Single    Spouse name: Not on file  . Number of children: 0  . Years of education: Not on file  . Highest education level: Not on file  Occupational History  . Not on file  Social Needs  . Financial resource strain: Not on file  . Food insecurity:    Worry: Not on file    Inability: Not on file  . Transportation needs:    Medical: Not on file    Non-medical: Not on file  Tobacco Use  . Smoking status: Never Smoker  . Smokeless tobacco: Never Used  Substance and Sexual Activity  . Alcohol use: No    Alcohol/week: 0.0 oz  . Drug use: No  . Sexual activity: Not on file  Lifestyle  . Physical activity:    Days per week: Not on file    Minutes per session: Not on file  . Stress: Not on file  Relationships  . Social connections:    Talks on phone: Not on file    Gets together: Not on file    Attends religious service: Not on file    Active member of club or organization: Not on file    Attends meetings of clubs or organizations: Not on file    Relationship status: Not on file  Other Topics Concern  . Not on file  Social History Narrative  . Not on file    Outpatient Encounter Medications as of 10/06/2017  Medication Sig  . BIOTIN PO Take 1 capsule by mouth daily.   . Blood Glucose Monitoring Suppl (CONTOUR BLOOD GLUCOSE SYSTEM) DEVI Use to test blood sugar up to three times daily - Using [employer]  . empagliflozin (JARDIANCE) 10 MG TABS tablet Take 10 mg by mouth daily.  . fish oil-omega-3 fatty acids 1000 MG capsule Take 1 g by mouth daily.  Marland Kitchen  FLUoxetine (PROZAC) 20 MG capsule TAKE 1 CAPSULE BY MOUTH EVERY DAY  . fluticasone (FLONASE) 50 MCG/ACT nasal spray PLACE 2 SPRAYS INTO THE NOSE DAILY.  Marland Kitchen glucose blood (BAYER CONTOUR TEST) test strip Use as instructed to test blood sugar three times daily - Using [employer]  . Lancet Devices (MICROLET NEXT LANCING DEVICE) MISC 1 each by Does not apply route daily.  . medroxyPROGESTERone (DEPO-PROVERA) 150 MG/ML injection INJECT 1 ML INTRAMUSCULARLY EVERY THREE MONTHS  . metFORMIN (GLUCOPHAGE XR) 500 MG 24 hr tablet Take 1 tablet (500 mg total) by mouth 2 (two) times daily.  Marland Kitchen MICROLET LANCETS MISC 1 each by Does not apply route 3 (three) times daily.  . Multiple Vitamin (MULTIVITAMIN) tablet Take 1 tablet by mouth daily.  . pravastatin (PRAVACHOL) 20 MG tablet TAKE 1 TABLET (20 MG TOTAL) BY MOUTH DAILY.  . ranitidine (ZANTAC) 150 MG capsule Take 150 mg by mouth every evening.  . zolpidem (AMBIEN) 5 MG tablet TAKE  1 TABLET BY MOUTH EVERY DAY AT BEDTIME AS NEEDED FOR SLEEP  . [DISCONTINUED] pantoprazole (PROTONIX) 40 MG tablet TAKE 1 TABLET (40 MG TOTAL) BY MOUTH DAILY.  . [EXPIRED] medroxyPROGESTERone (DEPO-PROVERA) injection 150 mg    No facility-administered encounter medications on file as of 10/06/2017.     Review of Systems  Constitutional: Negative for appetite change and unexpected weight change.  HENT: Negative for congestion and sinus pressure.   Respiratory: Negative for cough, chest tightness and shortness of breath.   Cardiovascular: Negative for chest pain, palpitations and leg swelling.  Gastrointestinal: Negative for abdominal pain, diarrhea, nausea and vomiting.  Genitourinary: Negative for difficulty urinating and dysuria.  Musculoskeletal: Negative for joint swelling and myalgias.       Increased right elbow pain as outlined.    Skin: Negative for color change and rash.  Neurological: Negative for dizziness, light-headedness and headaches.  Psychiatric/Behavioral: Negative for agitation and dysphoric mood.       Objective:     Blood pressure rechecked by me:  136/86  Physical Exam  Constitutional: She appears well-developed and well-nourished. No distress.  HENT:  Nose: Nose normal.  Mouth/Throat: Oropharynx is clear and moist.  Neck: Neck supple. No thyromegaly present.  Cardiovascular: Normal rate and regular rhythm.  Pulmonary/Chest: Breath sounds normal. No respiratory distress. She has no wheezes.  Abdominal: Soft. Bowel sounds are normal. There is no tenderness.  Musculoskeletal: She exhibits no edema.  Increased tenderness to palpation lateral elbow.  No increased soft tissue swelling.   Lymphadenopathy:    She has no cervical adenopathy.  Skin: No rash noted. No erythema.  Psychiatric: She has a normal mood and affect. Her behavior is normal.    BP 136/86   Pulse 75   Temp 98.1 F (36.7 C) (Oral)   Resp 18   Wt 189 lb 3.2 oz (85.8 kg)   SpO2 98%   BMI  33.52 kg/m  Wt Readings from Last 3 Encounters:  10/06/17 189 lb 3.2 oz (85.8 kg)  09/18/17 192 lb (87.1 kg)  08/21/17 194 lb (88 kg)     Lab Results  Component Value Date   WBC 5.4 07/14/2017   HGB 13.6 07/14/2017   HCT 40.5 07/14/2017   PLT 199 07/14/2017   GLUCOSE 148 (H) 07/14/2017   CHOL 146 07/14/2017   TRIG 158.0 (H) 07/14/2017   HDL 36.10 (L) 07/14/2017   LDLDIRECT 139.0 11/09/2016   LDLCALC 78 07/14/2017   ALT 21 07/14/2017   AST  17 07/14/2017   NA 141 07/14/2017   K 4.0 07/14/2017   CL 109 07/14/2017   CREATININE 0.79 07/14/2017   BUN 12 07/14/2017   CO2 25 07/14/2017   TSH 1.09 03/17/2017   HGBA1C 7.2 (H) 07/14/2017   MICROALBUR 2.0 (H) 03/17/2017    Mm Clip Placement Left  Result Date: 08/25/2017 CLINICAL DATA:  Status post ultrasound-guided core biopsy of a left breast mass. EXAM: DIAGNOSTIC LEFT MAMMOGRAM POST ULTRASOUND BIOPSY COMPARISON:  Previous exam(s). FINDINGS: Mammographic images were obtained following ultrasound guided biopsy of the left breast mass at the 4:30 o'clock axis. Heart shaped clip placed at the conclusion the procedure is appropriately positioned in the lower outer quadrant. IMPRESSION: Heart shaped biopsy clip appropriately positioned in the lower outer quadrant of the left breast. Final Assessment: Post Procedure Mammograms for Marker Placement Electronically Signed   By: Franki Cabot M.D.   On: 08/25/2017 08:49   US Breast Bx W Loc Dev 1st Lesion Img Bx Spec US Guide  Addendum Date: 08/29/2017   ADDENDUM REPORT: 08/29/2017 10:08 ADDENDUM: Pathology of the left breast biopsy revealed A. BREAST, LEFT 430; ULTRASOUND-GUIDED BIOPSY: USUAL DUCTAL HYPERPLASIA, COLUMNAR CELL CHANGE, APOCRINE METAPLASIA, AND FOCAL CYST FORMATION. REACTIVE STROMAL CHANGES SUGGESTIVE OF PRIOR CYST RUPTURE. NEGATIVE FOR ATYPIA AND MALIGNANCY. This was found to be concordant by Dr. Enriqueta Shutter. Recommendation: Return to routine screening mammography in one year. The  patient was contacted by phone by Jetta Lout, [place] on 08/28/17 at 5:05 PM for a post biopsy site check. The patient stated she did well following the biopsy with a small bruise but no pain. Post biopsy instructions were reviewed with the patient and all of her questions were answered. At the time of conversation she had not spoken to Dr. Bary Castilla regarding results. Results and recommendations from Dr. Enriqueta Shutter were relayed to the patient, but she was encouraged to follow-up with Dr. Bary Castilla as he may have other recommendations. She was encouraged to contact the [facility] with any further questions or concerns. Addendum by Jetta Lout, RRA on 08/29/17. Electronically Signed   By: Franki Cabot M.D.   On: 08/29/2017 10:08   Result Date: 08/29/2017 CLINICAL DATA:  Patient with left breast mass presents today for ultrasound-guided core biopsy. EXAM: ULTRASOUND GUIDED LEFT BREAST CORE NEEDLE BIOPSY COMPARISON:  Previous exam(s). PROCEDURE: I met with the patient and we discussed the procedure of ultrasound-guided biopsy, including benefits and alternatives. We discussed the high likelihood of a successful procedure. We discussed the risks of the procedure including infection, bleeding, tissue injury, clip migration, and inadequate sampling. Informed written consent was given. The usual time-out protocol was performed immediately prior to the procedure. Lesion quadrant: Lower outer quadrant Using sterile technique and 1% Lidocaine as local anesthetic, under direct ultrasound visualization, a 12 gauge spring-loaded device was used to perform biopsy of the left breast mass at the 4:30 o'clock axisusing a lateral approach. At the conclusion of the procedure, a heart shaped tissue marker clip was deployed into the biopsy cavity. Follow-up 2-view mammogram was performed and dictated separately. IMPRESSION: Ultrasound-guided biopsy of left breast mass at the 4:30 o'clock axis. No apparent complications.  Electronically Signed: By: Franki Cabot M.D. On: 08/25/2017 08:38       Assessment & Plan:   Problem List Items Addressed This Visit    Anemia    Follow cbc.  Had colonoscopy 11/2014 - diverticulosis.  Recommended f/u colonoscopy in 5 years.        Diabetes  mellitus ([place])    On jardiance now.  Sugars appear to be doing better.  Low carb diet and exercise.  Continue with weight loss.  Follow met b and a1c.        Relevant Orders   Hemoglobin M7E   Basic metabolic panel   Elevated LFTs    Diet, exercise and weight loss.  Follow liver function tests.        Relevant Orders   Hepatic function panel   GERD (gastroesophageal reflux disease)    Controlled on current regimen.  Follow.        Hypercholesterolemia    On pravastatin.  Low cholesterol diet and exercise.  Follow lipid panel and liver function tests.        Relevant Orders   Lipid panel   Hypertension    Blood pressure as outlined.  Follow pressures.        Mass of lower outer quadrant of left breast    S/p biopsy - normal - no atypia.  Recommended f/u in 08/2018.  Discussed depo.  Discussed recent studies.  She elects to remain on depo for now.  Follow.        Obesity (BMI 30-39.9)    Diet and exercise.  Follow.       Obstructive sleep apnea    CPAP.         Other Visit Diagnoses    Encounter for contraceptive management, unspecified type    -  Primary   Relevant Medications   medroxyPROGESTERone (DEPO-PROVERA) injection 150 mg (Completed)   Right elbow pain       Continue elbow strap.  avoid strenuous activity.  follow.  if persistent, may need injection.         Einar Pheasant, MD

## 2017-10-08 ENCOUNTER — Other Ambulatory Visit: Payer: Self-pay | Admitting: Internal Medicine

## 2017-10-09 ENCOUNTER — Telehealth: Payer: Self-pay | Admitting: Internal Medicine

## 2017-10-09 ENCOUNTER — Encounter: Payer: Self-pay | Admitting: Internal Medicine

## 2017-10-09 ENCOUNTER — Telehealth: Payer: Self-pay

## 2017-10-09 ENCOUNTER — Other Ambulatory Visit: Payer: Self-pay | Admitting: Internal Medicine

## 2017-10-09 NOTE — Telephone Encounter (Unsigned)
Copied from CRM [id number]. Topic: Appointment Scheduling - Scheduling Inquiry for Clinic >> Oct 09, 2017 10:13 AM Cipriano Bunker wrote: Reason for CRM: schedule labs for A1C or what doctor needs to check  Order needed

## 2017-10-09 NOTE — Assessment & Plan Note (Signed)
S/p biopsy - normal - no atypia.  Recommended f/u in 08/2018.  Discussed depo.  Discussed recent studies.  She elects to remain on depo for now.  Follow.

## 2017-10-09 NOTE — Assessment & Plan Note (Signed)
CPAP.  

## 2017-10-09 NOTE — Assessment & Plan Note (Signed)
On pravastatin.  Low cholesterol diet and exercise.  Follow lipid panel and liver function tests.   

## 2017-10-09 NOTE — Telephone Encounter (Signed)
Copied from CRM [id number]. Topic: Appointment Scheduling - Scheduling Inquiry for Clinic >> Oct 09, 2017 10:33 AM Cipriano Bunker wrote: Reason for CRM:   Dr. Lorin Picket had wanted her to return about August and could not get in until Sept. And wants to make sure it is ok wait until September.

## 2017-10-09 NOTE — Assessment & Plan Note (Signed)
Controlled on current regimen.  Follow.  

## 2017-10-09 NOTE — Assessment & Plan Note (Signed)
Blood pressure as outlined.  Follow pressures.  

## 2017-10-09 NOTE — Assessment & Plan Note (Signed)
Diet, exercise and weight loss. Follow liver function tests.   

## 2017-10-09 NOTE — Assessment & Plan Note (Signed)
Follow cbc.  Had colonoscopy 11/2014 - diverticulosis.  Recommended f/u colonoscopy in 5 years.

## 2017-10-09 NOTE — Assessment & Plan Note (Signed)
Diet and exercise.  Follow.  

## 2017-10-09 NOTE — Telephone Encounter (Signed)
Please advise 

## 2017-10-09 NOTE — Assessment & Plan Note (Signed)
On jardiance now.  Sugars appear to be doing better.  Low carb diet and exercise.  Continue with weight loss.  Follow met b and a1c.

## 2017-10-10 ENCOUNTER — Encounter: Payer: Self-pay | Admitting: Internal Medicine

## 2017-10-10 MED ORDER — ZOLPIDEM TARTRATE 5 MG PO TABS: ORAL_TABLET | ORAL | 1 refills | Status: DC

## 2017-10-10 NOTE — Telephone Encounter (Signed)
rx ok'd for ambien #30 with 1 refill.  

## 2017-10-10 NOTE — Telephone Encounter (Signed)
Last OV 10/06/17 last refill on Ambien 3/19 30 with one refill OK to fill?

## 2017-10-11 ENCOUNTER — Other Ambulatory Visit: Payer: Self-pay | Admitting: Internal Medicine

## 2017-10-16 NOTE — Telephone Encounter (Signed)
Patient would like to leave appts as is since everything is set up already.

## 2017-10-16 NOTE — Telephone Encounter (Signed)
See other phone note

## 2017-10-16 NOTE — Telephone Encounter (Signed)
Scheduled for A1C June 7

## 2017-10-31 ENCOUNTER — Ambulatory Visit: Payer: BC Managed Care – PPO | Admitting: Internal Medicine

## 2017-11-06 ENCOUNTER — Other Ambulatory Visit: Payer: Self-pay | Admitting: Internal Medicine

## 2017-11-10 ENCOUNTER — Other Ambulatory Visit (INDEPENDENT_AMBULATORY_CARE_PROVIDER_SITE_OTHER): Payer: BC Managed Care – PPO

## 2017-11-10 ENCOUNTER — Telehealth: Payer: Self-pay | Admitting: Internal Medicine

## 2017-11-10 DIAGNOSIS — R945 Abnormal results of liver function studies: Secondary | ICD-10-CM

## 2017-11-10 DIAGNOSIS — R7989 Other specified abnormal findings of blood chemistry: Secondary | ICD-10-CM

## 2017-11-10 DIAGNOSIS — E7800 Pure hypercholesterolemia, unspecified: Secondary | ICD-10-CM

## 2017-11-10 DIAGNOSIS — E119 Type 2 diabetes mellitus without complications: Secondary | ICD-10-CM | POA: Diagnosis not present

## 2017-11-10 LAB — LIPID PANEL
CHOLESTEROL: 174 mg/dL (ref 0–200)
HDL: 38.6 mg/dL — AB (ref >39.00)
LDL Cholesterol: 107 mg/dL — ABNORMAL HIGH (ref 0–99)
NONHDL: 135.36
Total CHOL/HDL Ratio: 5
Triglycerides: 141 mg/dL (ref 0.0–149.0)
VLDL: 28.2 mg/dL (ref 0.0–40.0)

## 2017-11-10 LAB — BASIC METABOLIC PANEL
BUN: 15 mg/dL (ref 6–23)
CALCIUM: 9.3 mg/dL (ref 8.4–10.5)
CO2: 24 mEq/L (ref 19–32)
Chloride: 107 mEq/L (ref 96–112)
Creatinine, Ser: 0.83 mg/dL (ref 0.40–1.20)
GFR: 77.38 mL/min (ref >60.00)
GLUCOSE: 133 mg/dL — AB (ref 70–99)
POTASSIUM: 4 meq/L (ref 3.5–5.1)
SODIUM: 141 meq/L (ref 135–145)

## 2017-11-10 LAB — HEPATIC FUNCTION PANEL
ALT: 24 U/L (ref 0–35)
AST: 20 U/L (ref 0–37)
Albumin: 4.2 g/dL (ref 3.5–5.2)
Alkaline Phosphatase: 60 U/L (ref 39–117)
BILIRUBIN DIRECT: 0.1 mg/dL (ref 0.0–0.3)
Total Bilirubin: 0.4 mg/dL (ref 0.2–1.2)
Total Protein: 7.2 g/dL (ref 6.0–8.3)

## 2017-11-10 LAB — HEMOGLOBIN A1C: Hgb A1c MFr Bld: 7.4 % — ABNORMAL HIGH (ref 4.6–6.5)

## 2017-11-10 NOTE — Telephone Encounter (Signed)
[facility] was at [facility] to [facility] on her zolpidem (AMBIEN) 5 MG tablet . [facility] would not [facility]. She would like to have it refilled.

## 2017-11-10 NOTE — Telephone Encounter (Signed)
Per [facility], patient can only get 15 at a time and has to pay out of pocket for the other 15. Notified patient that I could do a PA. She stated she has just picked up 15 and we can submit the PA when its time for another rx.

## 2017-11-10 NOTE — Telephone Encounter (Signed)
Please call pharmacy and see what the issues is and let pt know.  Per our records rx sent in for #30 with one refill on 10/10/17.

## 2017-11-10 NOTE — Telephone Encounter (Signed)
Last OV 10/06/17 Next OV 02/16/18 Last refill 10/10/17

## 2017-11-13 ENCOUNTER — Telehealth: Payer: Self-pay | Admitting: Pharmacist

## 2017-11-13 ENCOUNTER — Other Ambulatory Visit: Payer: Self-pay

## 2017-11-13 ENCOUNTER — Encounter: Payer: Self-pay | Admitting: Internal Medicine

## 2017-11-13 MED ORDER — PRAVASTATIN SODIUM 40 MG PO TABS: 40.0000 mg | ORAL_TABLET | Freq: Every day | ORAL | 1 refills | Status: DC

## 2017-11-13 MED ORDER — EMPAGLIFLOZIN 25 MG PO TABS
25.0000 mg | ORAL_TABLET | Freq: Every day | ORAL | 5 refills | Status: DC
Start: 2017-11-13 — End: 2018-06-19

## 2017-11-13 NOTE — Telephone Encounter (Signed)
Patient calls and reports that her A1C was 7.4% and her CBGs have been in the 130s but she does not check often. Patient endorses discouragement with her A1C. Reports she has been tolerating Jardiance well and denies hypoglycemia. Denies UTI/yeast infection sx. Patient inquires about increasing dose of Jardiance. Reports she has large supple of 10 mg tablets.   PLAN:  Increase Jardiance to 25 mg once daily. In the meantime, patient will take two 10 mg tablets once daily until gone.  Will follow up in ~1 week with patient.   Allena Katzaroline E Welles, Pharm.D., BCPS PGY2 Ambulatory Care Pharmacy Resident Phone: [phone]

## 2017-11-14 NOTE — Telephone Encounter (Signed)
Noted.  Also responded to pts my chart message.

## 2017-11-21 ENCOUNTER — Telehealth: Payer: Self-pay | Admitting: Internal Medicine

## 2017-11-21 ENCOUNTER — Telehealth: Payer: Self-pay | Admitting: Pharmacist

## 2017-11-21 NOTE — Telephone Encounter (Signed)
Called patient to f/u on CBGs since changing jardiance dose. No answer. Left hipaa-compliant VM requesting she return my call.   Allena Katzaroline E Welles, Pharm.D., BCPS PGY2 Ambulatory Care Pharmacy Resident Phone: [phone]

## 2017-11-21 NOTE — Telephone Encounter (Signed)
Mychart msg sent

## 2017-11-21 NOTE — Telephone Encounter (Signed)
Copied from CRM [id number]. Topic: Quick Communication - See Telephone Encounter >> Nov 21, 2017 10:01 AM Rudi CocoLathan, Tashika Goodin M, NT wrote: CRM for notification. See Telephone encounter for: 11/21/17.  Pt. Calling to speak with Alta Corningrisha D. About PA for medication [phone] (follow up call)

## 2017-11-21 NOTE — Telephone Encounter (Signed)
Please advise 

## 2017-11-24 NOTE — Telephone Encounter (Signed)
Pt calls to report she is tolerating increased jardiance and pravastatin. Patient reports some sx of bloating and headache - pt not convinced these are attributable to medication changes given recent weather patterns and increased stress at work and with friend's family. Reports her BGs have all been between 100-140s, excursions to 160s. Denies hypoglycemia. Patient wants to continue on medications.   Congratulated patient on her progress. No need for further pharmacy f/u as CBGs indicated well controlled DM. Pt verbalizes understanding.   Allena Katzaroline E Khamora Karan, Pharm.D., BCPS PGY2 Ambulatory Care Pharmacy Resident Phone: [phone]

## 2017-11-29 ENCOUNTER — Other Ambulatory Visit: Payer: Self-pay | Admitting: Internal Medicine

## 2017-11-29 ENCOUNTER — Encounter: Payer: Self-pay | Admitting: Internal Medicine

## 2017-11-30 MED ORDER — ZOLPIDEM TARTRATE 5 MG PO TABS: ORAL_TABLET | ORAL | 1 refills | Status: DC

## 2017-11-30 NOTE — Telephone Encounter (Signed)
[facility] and [facility] sent to [facility]

## 2017-11-30 NOTE — Telephone Encounter (Signed)
Spoke with [facility] regarding why we have not received request for PA. Pharmacist stated he needs new rx in order to send PA request over because patient does not have anymore refills on current script

## 2017-11-30 NOTE — Telephone Encounter (Signed)
Sent you a my chart message regarding this earlier this am

## 2017-11-30 NOTE — Telephone Encounter (Signed)
rx ok'd printed and placed in box.  Was already for #30.

## 2017-12-26 ENCOUNTER — Ambulatory Visit (INDEPENDENT_AMBULATORY_CARE_PROVIDER_SITE_OTHER): Payer: BC Managed Care – PPO | Admitting: *Deleted

## 2017-12-26 DIAGNOSIS — Z309 Encounter for contraceptive management, unspecified: Secondary | ICD-10-CM | POA: Diagnosis not present

## 2017-12-26 MED ORDER — MEDROXYPROGESTERONE ACETATE 150 MG/ML IM SUSP
150.0000 mg | Freq: Once | INTRAMUSCULAR | Status: AC
  Administered 2017-12-26: 150 mg via INTRAMUSCULAR

## 2017-12-26 NOTE — Progress Notes (Signed)
Injection given right deltoid at patient request . Depo-Provera 150 mg injection patient tolerated well.

## 2017-12-31 ENCOUNTER — Other Ambulatory Visit: Payer: Self-pay | Admitting: Internal Medicine

## 2018-01-01 ENCOUNTER — Other Ambulatory Visit: Payer: Self-pay | Admitting: Internal Medicine

## 2018-01-05 ENCOUNTER — Other Ambulatory Visit: Payer: BC Managed Care – PPO

## 2018-02-06 ENCOUNTER — Encounter: Payer: BC Managed Care – PPO | Admitting: Internal Medicine

## 2018-02-08 ENCOUNTER — Other Ambulatory Visit: Payer: Self-pay | Admitting: Internal Medicine

## 2018-02-12 NOTE — Telephone Encounter (Signed)
Faxed Rx to [facility] [place] 

## 2018-02-12 NOTE — Telephone Encounter (Signed)
Okay to refill? Last written on 11/30/17 for #0 with one refill.  LOV: 10/06/17 NOV: 02/16/18

## 2018-02-14 ENCOUNTER — Telehealth: Payer: Self-pay | Admitting: Radiology

## 2018-02-14 ENCOUNTER — Other Ambulatory Visit: Payer: Self-pay | Admitting: Internal Medicine

## 2018-02-14 DIAGNOSIS — E7800 Pure hypercholesterolemia, unspecified: Secondary | ICD-10-CM

## 2018-02-14 DIAGNOSIS — I10 Essential (primary) hypertension: Secondary | ICD-10-CM

## 2018-02-14 DIAGNOSIS — E119 Type 2 diabetes mellitus without complications: Secondary | ICD-10-CM

## 2018-02-14 NOTE — Progress Notes (Signed)
Orders placed for labs

## 2018-02-14 NOTE — Telephone Encounter (Signed)
Orders placed for labs

## 2018-02-14 NOTE — Telephone Encounter (Signed)
Pt coming in for labs Friday, please place future orders. Thank you.  

## 2018-02-16 ENCOUNTER — Encounter: Payer: Self-pay | Admitting: Internal Medicine

## 2018-02-16 ENCOUNTER — Ambulatory Visit (INDEPENDENT_AMBULATORY_CARE_PROVIDER_SITE_OTHER): Payer: BC Managed Care – PPO | Admitting: Internal Medicine

## 2018-02-16 ENCOUNTER — Other Ambulatory Visit (HOSPITAL_COMMUNITY)
Admission: RE | Admit: 2018-02-16 | Discharge: 2018-02-16 | Disposition: A | Discharge status: Home / Self Care | Payer: BC Managed Care – PPO | Source: Ambulatory Visit | Attending: Internal Medicine | Admitting: Internal Medicine

## 2018-02-16 ENCOUNTER — Other Ambulatory Visit (INDEPENDENT_AMBULATORY_CARE_PROVIDER_SITE_OTHER): Payer: BC Managed Care – PPO

## 2018-02-16 VITALS — BP 118/86 | HR 71 | Temp 98.0°F | Resp 18 | Ht 63.0 in | Wt 188.2 lb

## 2018-02-16 DIAGNOSIS — G4733 Obstructive sleep apnea (adult) (pediatric): Secondary | ICD-10-CM

## 2018-02-16 DIAGNOSIS — E669 Obesity, unspecified: Secondary | ICD-10-CM

## 2018-02-16 DIAGNOSIS — K219 Gastro-esophageal reflux disease without esophagitis: Secondary | ICD-10-CM

## 2018-02-16 DIAGNOSIS — I10 Essential (primary) hypertension: Secondary | ICD-10-CM

## 2018-02-16 DIAGNOSIS — E7800 Pure hypercholesterolemia, unspecified: Secondary | ICD-10-CM

## 2018-02-16 DIAGNOSIS — R945 Abnormal results of liver function studies: Secondary | ICD-10-CM

## 2018-02-16 DIAGNOSIS — Z124 Encounter for screening for malignant neoplasm of cervix: Secondary | ICD-10-CM

## 2018-02-16 DIAGNOSIS — D649 Anemia, unspecified: Secondary | ICD-10-CM | POA: Diagnosis not present

## 2018-02-16 DIAGNOSIS — E119 Type 2 diabetes mellitus without complications: Secondary | ICD-10-CM

## 2018-02-16 DIAGNOSIS — F439 Reaction to severe stress, unspecified: Secondary | ICD-10-CM

## 2018-02-16 DIAGNOSIS — R7989 Other specified abnormal findings of blood chemistry: Secondary | ICD-10-CM

## 2018-02-16 DIAGNOSIS — Z0000 Encounter for general adult medical examination without abnormal findings: Secondary | ICD-10-CM | POA: Diagnosis not present

## 2018-02-16 DIAGNOSIS — Z23 Encounter for immunization: Secondary | ICD-10-CM | POA: Diagnosis not present

## 2018-02-16 LAB — LDL CHOLESTEROL, DIRECT: Direct LDL: 127 mg/dL

## 2018-02-16 LAB — HEPATIC FUNCTION PANEL
ALBUMIN: 4.2 g/dL (ref 3.5–5.2)
ALT: 22 U/L (ref 0–35)
AST: 21 U/L (ref 0–37)
Alkaline Phosphatase: 73 U/L (ref 39–117)
Bilirubin, Direct: 0.1 mg/dL (ref 0.0–0.3)
Total Bilirubin: 0.7 mg/dL (ref 0.2–1.2)
Total Protein: 7.4 g/dL (ref 6.0–8.3)

## 2018-02-16 LAB — BASIC METABOLIC PANEL
BUN: 18 mg/dL (ref 6–23)
CHLORIDE: 106 meq/L (ref 96–112)
CO2: 23 mEq/L (ref 19–32)
Calcium: 9.4 mg/dL (ref 8.4–10.5)
Creatinine, Ser: 0.77 mg/dL (ref 0.40–1.20)
GFR: 84.29 mL/min (ref >60.00)
GLUCOSE: 145 mg/dL — AB (ref 70–99)
POTASSIUM: 4 meq/L (ref 3.5–5.1)
Sodium: 140 mEq/L (ref 135–145)

## 2018-02-16 LAB — LIPID PANEL
CHOL/HDL RATIO: 4
Cholesterol: 189 mg/dL (ref 0–200)
HDL: 42.7 mg/dL (ref >39.00)
NonHDL: 146.24
Triglycerides: 241 mg/dL — ABNORMAL HIGH (ref 0.0–149.0)
VLDL: 48.2 mg/dL — AB (ref 0.0–40.0)

## 2018-02-16 LAB — HEMOGLOBIN A1C: HEMOGLOBIN A1C: 7.4 % — AB (ref 4.6–6.5)

## 2018-02-16 NOTE — Progress Notes (Signed)
Patient ID: Dayona Shaheen, female   DOB: 10/22/1965, 52 y.o.   MRN: [id number]   Subjective:    Patient ID: Janat Tabbert, female    DOB: April 16, 1966, 52 y.o.   MRN: [id number]  HPI  Patient here for her physical exam.  She reports she is doing relatively well.  Taking Jardiance.  States am sugars are in the 120s.  Trying to do better with her diet.  Plans to get more serious about diet and exercise.  No chest pain.  No sob.  No acid reflux.  No abdominal pain.  Bowels moving.  No urine change.  Handling stress.     Past Medical History:  Diagnosis Date  . Anemia   . Diabetes mellitus ([place])   . Gastritis   . History of abnormal Pap smear    s/p LEEP, h/o HPV  . Hypercholesterolemia   . Hypertension   . IBS (irritable bowel syndrome)    Past Surgical History:  Procedure Laterality Date  . APPENDECTOMY    . BREAST BIOPSY Left 08/25/2017   left breast biopsy path pending  . COLONOSCOPY WITH PROPOFOL N/A 11/28/2014   Procedure: COLONOSCOPY WITH PROPOFOL;  Surgeon: Lollie Sails, MD;  Location: [facility] ENDOSCOPY;  Service: Endoscopy;  Laterality: N/A;   Family History  Problem Relation Age of Onset  . Diabetes Paternal Grandmother   . Emphysema Paternal Grandfather   . Breast cancer Sister 71  . Colon cancer Neg Hx    Social History   Socioeconomic History  . Marital status: Single    Spouse name: Not on file  . Number of children: 0  . Years of education: Not on file  . Highest education level: Not on file  Occupational History  . Not on file  Social Needs  . Financial resource strain: Not on file  . Food insecurity:    Worry: Not on file    Inability: Not on file  . Transportation needs:    Medical: Not on file    Non-medical: Not on file  Tobacco Use  . Smoking status: Never Smoker  . Smokeless tobacco: Never Used  Substance and Sexual Activity  . Alcohol use: No    Alcohol/week: 0.0 standard drinks  . Drug use: No  . Sexual activity: Not on file    Lifestyle  . Physical activity:    Days per week: Not on file    Minutes per session: Not on file  . Stress: Not on file  Relationships  . Social connections:    Talks on phone: Not on file    Gets together: Not on file    Attends religious service: Not on file    Active member of club or organization: Not on file    Attends meetings of clubs or organizations: Not on file    Relationship status: Not on file  Other Topics Concern  . Not on file  Social History Narrative  . Not on file    Outpatient Encounter Medications as of 02/16/2018  Medication Sig  . BIOTIN PO Take 1 capsule by mouth daily.   . Blood Glucose Monitoring Suppl (CONTOUR BLOOD GLUCOSE SYSTEM) DEVI Use to test blood sugar up to three times daily - Using [employer]  . empagliflozin (JARDIANCE) 25 MG TABS tablet Take 25 mg by mouth daily.  . fish oil-omega-3 fatty acids 1000 MG capsule Take 1 g by mouth daily.  Marland Kitchen FLUoxetine (PROZAC) 20 MG capsule TAKE 1 CAPSULE BY MOUTH  EVERY DAY  . fluticasone (FLONASE) 50 MCG/ACT nasal spray PLACE 2 SPRAYS INTO THE NOSE DAILY.  Marland Kitchen glucose blood (BAYER CONTOUR TEST) test strip Use as instructed to test blood sugar three times daily - Using [employer]  . Lancet Devices (MICROLET NEXT LANCING DEVICE) MISC 1 each by Does not apply route daily.  . medroxyPROGESTERone (DEPO-PROVERA) 150 MG/ML injection INJECT 1 ML INTRAMUSCULARLY EVERY THREE MONTHS  . metFORMIN (GLUCOPHAGE XR) 500 MG 24 hr tablet Take 1 tablet (500 mg total) by mouth 2 (two) times daily.  . metFORMIN (GLUCOPHAGE-XR) 500 MG 24 hr tablet TAKE 2 TABLETS BY MOUTH TWICE A DAY  . MICROLET LANCETS MISC 1 each by Does not apply route 3 (three) times daily.  . Multiple Vitamin (MULTIVITAMIN) tablet Take 1 tablet by mouth daily.  . pantoprazole (PROTONIX) 40 MG tablet TAKE 1 TABLET (40 MG TOTAL) BY MOUTH DAILY.  . pravastatin (PRAVACHOL) 40 MG tablet Take 1 tablet (40 mg total) by mouth daily.  . ranitidine  (ZANTAC) 150 MG capsule Take 150 mg by mouth every evening.  . zolpidem (AMBIEN) 5 MG tablet TAKE 1 TABLET BY MOUTH EVERY DAY AT BEDTIME AS NEEDED FOR SLEEP   No facility-administered encounter medications on file as of 02/16/2018.     Review of Systems  Constitutional: Negative for appetite change and unexpected weight change.  HENT: Negative for congestion and sinus pressure.   Eyes: Negative for pain and visual disturbance.  Respiratory: Negative for cough, chest tightness and shortness of breath.   Cardiovascular: Negative for chest pain, palpitations and leg swelling.  Gastrointestinal: Negative for abdominal pain, diarrhea, nausea and vomiting.  Genitourinary: Negative for difficulty urinating and dysuria.  Musculoskeletal: Negative for joint swelling and myalgias.  Skin: Negative for color change and rash.  Neurological: Negative for dizziness, light-headedness and headaches.  Hematological: Negative for adenopathy. Does not bruise/bleed easily.  Psychiatric/Behavioral: Negative for agitation and dysphoric mood.       Objective:    Physical Exam  Constitutional: She is oriented to person, place, and time. She appears well-developed and well-nourished. No distress.  HENT:  Nose: Nose normal.  Mouth/Throat: Oropharynx is clear and moist.  Eyes: Right eye exhibits no discharge. Left eye exhibits no discharge. No scleral icterus.  Neck: Neck supple. No thyromegaly present.  Cardiovascular: Normal rate and regular rhythm.  Pulmonary/Chest: Breath sounds normal. No accessory muscle usage. No tachypnea. No respiratory distress. She has no decreased breath sounds. She has no wheezes. She has no rhonchi. Right breast exhibits no inverted nipple, no mass, no nipple discharge and no tenderness (no axillary adenopathy). Left breast exhibits no inverted nipple, no mass, no nipple discharge and no tenderness (no axilarry adenopathy).  Abdominal: Soft. Bowel sounds are normal. There is no  tenderness.  Genitourinary:  Genitourinary Comments: Normal external genitalia.  Vaginal vault without lesions.  Cervix identified.  Pap smear performed.  Could not appreciate any adnexal masses or tenderness.    Musculoskeletal: She exhibits no edema or tenderness.  Lymphadenopathy:    She has no cervical adenopathy.  Neurological: She is alert and oriented to person, place, and time.  Skin: No rash noted. No erythema.  Psychiatric: She has a normal mood and affect. Her behavior is normal.    BP 118/86 (BP Location: Left Arm, Patient Position: Sitting, Cuff Size: Normal)   Pulse 71   Temp 98 F (36.7 C) (Oral)   Resp 18   Ht _0  (1.6 m)   Wt 188  lb 3.2 oz (85.4 kg)   SpO2 98%   BMI 33.34 kg/m  Wt Readings from Last 3 Encounters:  02/16/18 188 lb 3.2 oz (85.4 kg)  10/06/17 189 lb 3.2 oz (85.8 kg)  09/18/17 192 lb (87.1 kg)     Lab Results  Component Value Date   WBC 5.4 07/14/2017   HGB 13.6 07/14/2017   HCT 40.5 07/14/2017   PLT 199 07/14/2017   GLUCOSE 145 (H) 02/16/2018   CHOL 189 02/16/2018   TRIG 241.0 (H) 02/16/2018   HDL 42.70 02/16/2018   LDLDIRECT 127.0 02/16/2018   LDLCALC 107 (H) 11/10/2017   ALT 22 02/16/2018   AST 21 02/16/2018   NA 140 02/16/2018   K 4.0 02/16/2018   CL 106 02/16/2018   CREATININE 0.77 02/16/2018   BUN 18 02/16/2018   CO2 23 02/16/2018   TSH 1.09 03/17/2017   HGBA1C 7.4 (H) 02/16/2018   MICROALBUR 2.0 (H) 03/17/2017    Mm Clip Placement Left  Result Date: 08/25/2017 CLINICAL DATA:  Status post ultrasound-guided core biopsy of a left breast mass. EXAM: DIAGNOSTIC LEFT MAMMOGRAM POST ULTRASOUND BIOPSY COMPARISON:  Previous exam(s). FINDINGS: Mammographic images were obtained following ultrasound guided biopsy of the left breast mass at the 4:30 o'clock axis. Heart shaped clip placed at the conclusion the procedure is appropriately positioned in the lower outer quadrant. IMPRESSION: Heart shaped biopsy clip appropriately positioned  in the lower outer quadrant of the left breast. Final Assessment: Post Procedure Mammograms for Marker Placement Electronically Signed   By: Franki Cabot M.D.   On: 08/25/2017 08:49   US Breast Bx W Loc Dev 1st Lesion Img Bx Spec US Guide  Addendum Date: 08/29/2017   ADDENDUM REPORT: 08/29/2017 10:08 ADDENDUM: Pathology of the left breast biopsy revealed A. BREAST, LEFT 430; ULTRASOUND-GUIDED BIOPSY: USUAL DUCTAL HYPERPLASIA, COLUMNAR CELL CHANGE, APOCRINE METAPLASIA, AND FOCAL CYST FORMATION. REACTIVE STROMAL CHANGES SUGGESTIVE OF PRIOR CYST RUPTURE. NEGATIVE FOR ATYPIA AND MALIGNANCY. This was found to be concordant by Dr. Enriqueta Shutter. Recommendation: Return to routine screening mammography in one year. The patient was contacted by phone by Jetta Lout, [place] on 08/28/17 at 5:05 PM for a post biopsy site check. The patient stated she did well following the biopsy with a small bruise but no pain. Post biopsy instructions were reviewed with the patient and all of her questions were answered. At the time of conversation she had not spoken to Dr. Bary Castilla regarding results. Results and recommendations from Dr. Enriqueta Shutter were relayed to the patient, but she was encouraged to follow-up with Dr. Bary Castilla as he may have other recommendations. She was encouraged to contact the [facility] with any further questions or concerns. Addendum by Jetta Lout, RRA on 08/29/17. Electronically Signed   By: Franki Cabot M.D.   On: 08/29/2017 10:08   Result Date: 08/29/2017 CLINICAL DATA:  Patient with left breast mass presents today for ultrasound-guided core biopsy. EXAM: ULTRASOUND GUIDED LEFT BREAST CORE NEEDLE BIOPSY COMPARISON:  Previous exam(s). PROCEDURE: I met with the patient and we discussed the procedure of ultrasound-guided biopsy, including benefits and alternatives. We discussed the high likelihood of a successful procedure. We discussed the risks of the procedure including infection, bleeding, tissue injury,  clip migration, and inadequate sampling. Informed written consent was given. The usual time-out protocol was performed immediately prior to the procedure. Lesion quadrant: Lower outer quadrant Using sterile technique and 1% Lidocaine as local anesthetic, under direct ultrasound visualization, a 12 gauge spring-loaded device was used  to perform biopsy of the left breast mass at the 4:30 o'clock axisusing a lateral approach. At the conclusion of the procedure, a heart shaped tissue marker clip was deployed into the biopsy cavity. Follow-up 2-view mammogram was performed and dictated separately. IMPRESSION: Ultrasound-guided biopsy of left breast mass at the 4:30 o'clock axis. No apparent complications. Electronically Signed: By: Franki Cabot M.D. On: 08/25/2017 08:38       Assessment & Plan:   Problem List Items Addressed This Visit    Anemia    Follow cbc.  Had colonoscopy 2016.  Recommended f/u in 5 years.        Diabetes mellitus ([place])    Sugars as outlined.  On Jardiance.  Discussed diet and exercise.  Follow met b and a1c.        Relevant Orders   Hemoglobin A1c   Microalbumin / creatinine urine ratio   Elevated LFTs    Diet and exercise and weight loss.  Follow liver function tests.        GERD (gastroesophageal reflux disease)    Controlled on current regimen.  Follow.        Health care maintenance    Physical today 02/16/18.  PAP 02/16/18.  Mammogram 08/2017 recommended biopsy.  Biopsy negative.  Recommended yearly f/u.  Due f/u mammogram in 08/2018.  Colonoscopy 11/2014.  Recommended f/u colonoscopy in 5 years.        Hypercholesterolemia    On pravastatin.  Low cholesterol diet and exercise.  Follow lipid panel and liver function tests.        Relevant Orders   Hepatic function panel   Lipid panel   Hypertension    Blood pressure as outlined.  Follow met b.       Relevant Orders   TSH   Basic metabolic panel   Obesity (BMI 30-39.9)    Discussed diet and exercise.   Follow.       Obstructive sleep apnea    Using cpap.        Stress    Overall handling stress.  Follow.         Other Visit Diagnoses    Routine general medical examination at a health care facility    -  Primary   Cervical cancer screening       Relevant Orders   Cytology - PAP   Need for influenza vaccination       Relevant Orders   Flu Vaccine QUAD 6+ mos PF IM (Fluarix Quad PF) (Completed)       Einar Pheasant, MD

## 2018-02-18 ENCOUNTER — Encounter: Payer: Self-pay | Admitting: Internal Medicine

## 2018-02-18 NOTE — Assessment & Plan Note (Signed)
Follow cbc.  Had colonoscopy 2016.  Recommended f/u in 5 years.

## 2018-02-18 NOTE — Assessment & Plan Note (Signed)
On pravastatin.  Low cholesterol diet and exercise.  Follow lipid panel and liver function tests.   

## 2018-02-18 NOTE — Assessment & Plan Note (Signed)
Controlled on current regimen.  Follow.  

## 2018-02-18 NOTE — Assessment & Plan Note (Signed)
Discussed diet and exercise.  Follow.  

## 2018-02-18 NOTE — Assessment & Plan Note (Signed)
Using cpap

## 2018-02-18 NOTE — Assessment & Plan Note (Signed)
Overall handling stress.  Follow.   

## 2018-02-18 NOTE — Assessment & Plan Note (Signed)
Sugars as outlined.  On Jardiance.  Discussed diet and exercise.  Follow met b and a1c.

## 2018-02-18 NOTE — Assessment & Plan Note (Addendum)
Physical today 02/16/18.  PAP 02/16/18.  Mammogram 08/2017 recommended biopsy.  Biopsy negative.  Recommended yearly f/u.  Due f/u mammogram in 08/2018.  Colonoscopy 11/2014.  Recommended f/u colonoscopy in 5 years.

## 2018-02-18 NOTE — Assessment & Plan Note (Signed)
Blood pressure as outlined.  Follow met b.

## 2018-02-18 NOTE — Assessment & Plan Note (Signed)
Diet and exercise and weight loss.  Follow liver function tests.   °

## 2018-02-19 LAB — CYTOLOGY - PAP
DIAGNOSIS: NEGATIVE
HPV: NOT DETECTED

## 2018-02-20 ENCOUNTER — Encounter: Payer: Self-pay | Admitting: Internal Medicine

## 2018-02-21 LAB — HM DIABETES EYE EXAM

## 2018-02-22 ENCOUNTER — Other Ambulatory Visit: Payer: Self-pay

## 2018-02-22 MED ORDER — ROSUVASTATIN CALCIUM 20 MG PO TABS: 20.0000 mg | ORAL_TABLET | Freq: Every day | ORAL | 3 refills | Status: DC

## 2018-03-08 ENCOUNTER — Ambulatory Visit: Payer: Self-pay

## 2018-03-08 ENCOUNTER — Encounter: Payer: Self-pay | Admitting: Internal Medicine

## 2018-03-08 NOTE — Telephone Encounter (Signed)
Patient advised of below and verbalized understanding. She will stop Crestor and see if symptoms resolve and call if symptoms don't resolve she will call for appointment

## 2018-03-08 NOTE — Telephone Encounter (Signed)
OK to switch back to pravastatin?

## 2018-03-08 NOTE — Telephone Encounter (Signed)
Returned patient call who had questions about Crestor. She stated she is not sick but has had headache and body aches after taking Crestor 2 weeks. She would like to return to her original Pravastatin. Pt refused appointment and instead would like a call from Dr.Scott. Care advice given Pt verbalized understanding.  Reason for Disposition . Caller has medication question, adult has minor symptoms, caller declines triage, and triager answers question  Answer Assessment - Initial Assessment Questions 1. SYMPTOMS: "Do you have any symptoms?"     Headache body aches 2. SEVERITY: If symptoms are present, ask "Are they mild, moderate or severe?"    Moderate consistant  Protocols used: MEDICATION QUESTION CALL-A-AH

## 2018-03-08 NOTE — Telephone Encounter (Signed)
Needs to stop crestor and see if symptoms resolve.  Call with update.  If any concern not related to medication or any acute issues, needs evaluation.

## 2018-03-14 ENCOUNTER — Ambulatory Visit: Payer: BC Managed Care – PPO

## 2018-03-14 ENCOUNTER — Encounter: Payer: Self-pay | Admitting: Internal Medicine

## 2018-03-14 NOTE — Telephone Encounter (Signed)
Anne [name] has been working on [employer] for [employer].

## 2018-03-26 ENCOUNTER — Other Ambulatory Visit: Payer: Self-pay | Admitting: Internal Medicine

## 2018-04-13 ENCOUNTER — Other Ambulatory Visit: Payer: Self-pay | Admitting: Internal Medicine

## 2018-06-18 ENCOUNTER — Other Ambulatory Visit (INDEPENDENT_AMBULATORY_CARE_PROVIDER_SITE_OTHER): Payer: BC Managed Care – PPO

## 2018-06-18 ENCOUNTER — Other Ambulatory Visit: Payer: Self-pay | Admitting: Internal Medicine

## 2018-06-18 DIAGNOSIS — E7800 Pure hypercholesterolemia, unspecified: Secondary | ICD-10-CM

## 2018-06-18 DIAGNOSIS — E119 Type 2 diabetes mellitus without complications: Secondary | ICD-10-CM

## 2018-06-18 DIAGNOSIS — I10 Essential (primary) hypertension: Secondary | ICD-10-CM | POA: Diagnosis not present

## 2018-06-18 LAB — BASIC METABOLIC PANEL
BUN: 15 mg/dL (ref 6–23)
CO2: 25 meq/L (ref 19–32)
Calcium: 9.3 mg/dL (ref 8.4–10.5)
Chloride: 103 mEq/L (ref 96–112)
Creatinine, Ser: 0.79 mg/dL (ref 0.40–1.20)
GFR: 81.72 mL/min (ref >60.00)
Glucose, Bld: 143 mg/dL — ABNORMAL HIGH (ref 70–99)
Potassium: 4.3 mEq/L (ref 3.5–5.1)
Sodium: 137 mEq/L (ref 135–145)

## 2018-06-18 LAB — LIPID PANEL
Cholesterol: 256 mg/dL — ABNORMAL HIGH (ref 0–200)
HDL: 45.1 mg/dL (ref >39.00)
Total CHOL/HDL Ratio: 6
Triglycerides: 430 mg/dL — ABNORMAL HIGH (ref 0.0–149.0)

## 2018-06-18 LAB — MICROALBUMIN / CREATININE URINE RATIO
Creatinine,U: 94.9 mg/dL
MICROALB/CREAT RATIO: 0.8 mg/g (ref 0.0–30.0)
Microalb, Ur: 0.8 mg/dL (ref 0.0–1.9)

## 2018-06-18 LAB — HEPATIC FUNCTION PANEL
ALT: 26 U/L (ref 0–35)
AST: 21 U/L (ref 0–37)
Albumin: 4 g/dL (ref 3.5–5.2)
Alkaline Phosphatase: 83 U/L (ref 39–117)
Bilirubin, Direct: 0 mg/dL (ref 0.0–0.3)
Total Bilirubin: 0.3 mg/dL (ref 0.2–1.2)
Total Protein: 7.2 g/dL (ref 6.0–8.3)

## 2018-06-18 LAB — TSH: TSH: 1.01 u[IU]/mL (ref 0.35–4.50)

## 2018-06-18 LAB — HEMOGLOBIN A1C: Hgb A1c MFr Bld: 7.2 % — ABNORMAL HIGH (ref 4.6–6.5)

## 2018-06-18 LAB — LDL CHOLESTEROL, DIRECT: Direct LDL: 165 mg/dL

## 2018-06-19 ENCOUNTER — Other Ambulatory Visit: Payer: Self-pay | Admitting: Internal Medicine

## 2018-06-19 DIAGNOSIS — E119 Type 2 diabetes mellitus without complications: Secondary | ICD-10-CM

## 2018-06-21 ENCOUNTER — Encounter: Payer: Self-pay | Admitting: Internal Medicine

## 2018-06-21 ENCOUNTER — Telehealth: Payer: Self-pay | Admitting: *Deleted

## 2018-06-21 ENCOUNTER — Other Ambulatory Visit: Payer: Self-pay | Admitting: Internal Medicine

## 2018-06-21 NOTE — Telephone Encounter (Signed)
Copied from CRM [id number]. Topic: General - Inquiry >> Jun 21, 2018 11:04 AM Jilda Roche wrote: Reason for CRM: Patient called and asked for Morrie Sheldon to call her with her lab results  Best call back is [phone]

## 2018-06-21 NOTE — Telephone Encounter (Signed)
Patient is requesting lab results from 1/13

## 2018-06-21 NOTE — Telephone Encounter (Signed)
Sent pt a my chart message with her lab results.  D/w her more at her upcoming appt - 06/22/18

## 2018-06-22 ENCOUNTER — Ambulatory Visit: Payer: BC Managed Care – PPO | Admitting: Internal Medicine

## 2018-06-22 ENCOUNTER — Encounter: Payer: Self-pay | Admitting: Internal Medicine

## 2018-06-22 DIAGNOSIS — E119 Type 2 diabetes mellitus without complications: Secondary | ICD-10-CM | POA: Diagnosis not present

## 2018-06-22 DIAGNOSIS — K219 Gastro-esophageal reflux disease without esophagitis: Secondary | ICD-10-CM | POA: Diagnosis not present

## 2018-06-22 DIAGNOSIS — R945 Abnormal results of liver function studies: Secondary | ICD-10-CM | POA: Diagnosis not present

## 2018-06-22 DIAGNOSIS — D649 Anemia, unspecified: Secondary | ICD-10-CM

## 2018-06-22 DIAGNOSIS — E669 Obesity, unspecified: Secondary | ICD-10-CM

## 2018-06-22 DIAGNOSIS — I10 Essential (primary) hypertension: Secondary | ICD-10-CM

## 2018-06-22 DIAGNOSIS — E7800 Pure hypercholesterolemia, unspecified: Secondary | ICD-10-CM

## 2018-06-22 DIAGNOSIS — G4733 Obstructive sleep apnea (adult) (pediatric): Secondary | ICD-10-CM

## 2018-06-22 DIAGNOSIS — R7989 Other specified abnormal findings of blood chemistry: Secondary | ICD-10-CM

## 2018-06-22 DIAGNOSIS — F439 Reaction to severe stress, unspecified: Secondary | ICD-10-CM

## 2018-06-22 LAB — HM DIABETES FOOT EXAM

## 2018-06-22 MED ORDER — ATORVASTATIN CALCIUM 10 MG PO TABS: 10.0000 mg | ORAL_TABLET | Freq: Every day | ORAL | 2 refills | Status: DC

## 2018-06-22 NOTE — Progress Notes (Signed)
Patient ID: Anne [name], female   DOB: 03-18-1966, 53 y.o.   MRN: [id number]   Subjective:    Patient ID: Anne [name], female    DOB: Jun 02, 1966, 53 y.o.   MRN: [id number]  HPI  Patient here for a scheduled follow up.  She reports she feels good.  Is off depo shots.  Last injection around 12/26/17.   No bleeding or spotting since.  Not having emotional swings.  Stopped crestor.  Was causing migraines.  These stopped when she stopped the medication.  She is on metformin taking 1 tablet in am and two tablets in pm.  Tolerating. On jardiance.  Discussed diet and exercise.  Sugars are improving.  No chest pain.  No sob.  No acid reflux.  No abdominal pain.  Bowels moving.  No diarrhea or constipation.  No urine change.  Taking pepcid time one month.  Discussed labs.  LDL increased.   a1c 7.2.     Past Medical History:  Diagnosis Date  . Anemia   . Diabetes mellitus ([place])   . Gastritis   . History of abnormal Pap smear    s/p LEEP, h/o HPV  . Hypercholesterolemia   . Hypertension   . IBS (irritable bowel syndrome)    Past Surgical History:  Procedure Laterality Date  . APPENDECTOMY    . BREAST BIOPSY Left 08/25/2017   left breast biopsy path pending  . COLONOSCOPY WITH PROPOFOL N/A 11/28/2014   Procedure: COLONOSCOPY WITH PROPOFOL;  Surgeon: Lollie Sails, MD;  Location: [facility]COPY;  Service: Endoscopy;  Laterality: N/A;   Family History  Problem Relation Age of Onset  . Diabetes Paternal Grandmother   . Emphysema Paternal Grandfather   . Breast cancer Sister 48  . Colon cancer Neg Hx    Social History   Socioeconomic History  . Marital status: Single    Spouse name: Not on file  . Number of children: 0  . Years of education: Not on file  . Highest education level: Not on file  Occupational History  . Not on file  Social Needs  . Financial resource strain: Not on file  . Food insecurity:    Worry: Not on file    Inability: Not on file  .  Transportation needs:    Medical: Not on file    Non-medical: Not on file  Tobacco Use  . Smoking status: Never Smoker  . Smokeless tobacco: Never Used  Substance and Sexual Activity  . Alcohol use: No    Alcohol/week: 0.0 standard drinks  . Drug use: No  . Sexual activity: Not on file  Lifestyle  . Physical activity:    Days per week: Not on file    Minutes per session: Not on file  . Stress: Not on file  Relationships  . Social connections:    Talks on phone: Not on file    Gets together: Not on file    Attends religious service: Not on file    Active member of club or organization: Not on file    Attends meetings of clubs or organizations: Not on file    Relationship status: Not on file  Other Topics Concern  . Not on file  Social History Narrative  . Not on file    Outpatient Encounter Medications as of 06/22/2018  Medication Sig  . BIOTIN PO Take 1 capsule by mouth daily.   . Blood Glucose Monitoring Suppl (CONTOUR BLOOD GLUCOSE SYSTEM) DEVI Use to test blood  sugar up to three times daily - Using [employer]  . fish oil-omega-3 fatty acids 1000 MG capsule Take 1 g by mouth daily.  Marland Kitchen FLUoxetine (PROZAC) 20 MG capsule TAKE 1 CAPSULE BY MOUTH EVERY DAY  . fluticasone (FLONASE) 50 MCG/ACT nasal spray PLACE 2 SPRAYS INTO THE NOSE DAILY.  Marland Kitchen glucose blood (BAYER CONTOUR TEST) test strip Use as instructed to test blood sugar three times daily - Using [employer]  . JARDIANCE 25 MG TABS tablet TAKE 25 MG BY MOUTH DAILY.  Marland Kitchen Lancet Devices (MICROLET NEXT LANCING DEVICE) MISC 1 each by Does not apply route daily.  . metFORMIN (GLUCOPHAGE-XR) 500 MG 24 hr tablet TAKE 2 TABLETS BY MOUTH TWICE A DAY  . MICROLET LANCETS MISC 1 each by Does not apply route 3 (three) times daily.  . Multiple Vitamin (MULTIVITAMIN) tablet Take 1 tablet by mouth daily.  . pantoprazole (PROTONIX) 40 MG tablet TAKE 1 TABLET (40 MG TOTAL) BY MOUTH DAILY.  . [DISCONTINUED]  medroxyPROGESTERone (DEPO-PROVERA) 150 MG/ML injection INJECT 1 ML INTRAMUSCULARLY EVERY THREE MONTHS  . [DISCONTINUED] pravastatin (PRAVACHOL) 40 MG tablet Take 1 tablet (40 mg total) by mouth daily.  . [DISCONTINUED] ranitidine (ZANTAC) 150 MG capsule Take 150 mg by mouth every evening.  . [DISCONTINUED] rosuvastatin (CRESTOR) 20 MG tablet Take 1 tablet (20 mg total) by mouth daily.  . [DISCONTINUED] zolpidem (AMBIEN) 5 MG tablet TAKE 1 TABLET BY MOUTH AT BEDTIME AS NEEDED FOR SLEEP  . atorvastatin (LIPITOR) 10 MG tablet Take 1 tablet (10 mg total) by mouth daily.   No facility-administered encounter medications on file as of 06/22/2018.     Review of Systems  Constitutional: Negative for appetite change and unexpected weight change.  HENT: Negative for congestion and sinus pressure.   Respiratory: Negative for cough, chest tightness and shortness of breath.   Cardiovascular: Negative for chest pain, palpitations and leg swelling.  Gastrointestinal: Negative for abdominal pain, diarrhea, nausea and vomiting.  Genitourinary: Negative for difficulty urinating and dysuria.  Musculoskeletal: Negative for joint swelling and myalgias.  Skin: Negative for color change and rash.  Neurological: Negative for dizziness, light-headedness and headaches.  Psychiatric/Behavioral: Negative for agitation and dysphoric mood.       Objective:    Physical Exam Constitutional:      General: She is not in acute distress.    Appearance: Normal appearance.  HENT:     Nose: Nose normal. No congestion.     Mouth/Throat:     Pharynx: No oropharyngeal exudate or posterior oropharyngeal erythema.  Neck:     Musculoskeletal: Neck supple. No muscular tenderness.     Thyroid: No thyromegaly.  Cardiovascular:     Rate and Rhythm: Normal rate and regular rhythm.  Pulmonary:     Effort: No respiratory distress.     Breath sounds: Normal breath sounds. No wheezing.  Abdominal:     General: Bowel sounds are  normal.     Palpations: Abdomen is soft.     Tenderness: There is no abdominal tenderness.  Musculoskeletal:        General: No swelling or tenderness.     Comments: Feet:  No lesions.  DP pulses palpable and equal bilaterally.  Sensation intact to light touch and pin prick.   Lymphadenopathy:     Cervical: No cervical adenopathy.  Skin:    Findings: No erythema or rash.  Neurological:     Mental Status: She is alert.  Psychiatric:  Mood and Affect: Mood normal.        Behavior: Behavior normal.     BP 138/88 (BP Location: Left Arm, Patient Position: Sitting, Cuff Size: Normal)   Pulse 96   Temp 97.9 F (36.6 C) (Oral)   Resp 16   Wt 191 lb 12.8 oz (87 kg)   SpO2 97%   BMI 33.98 kg/m  Wt Readings from Last 3 Encounters:  06/22/18 191 lb 12.8 oz (87 kg)  02/16/18 188 lb 3.2 oz (85.4 kg)  10/06/17 189 lb 3.2 oz (85.8 kg)     Lab Results  Component Value Date   WBC 5.4 07/14/2017   HGB 13.6 07/14/2017   HCT 40.5 07/14/2017   PLT 199 07/14/2017   GLUCOSE 143 (H) 06/18/2018   CHOL 256 (H) 06/18/2018   TRIG (H) 06/18/2018    430.0 Triglyceride is over 400; calculations on Lipids are invalid.   HDL 45.10 06/18/2018   LDLDIRECT 165.0 06/18/2018   LDLCALC 107 (H) 11/10/2017   ALT 26 06/18/2018   AST 21 06/18/2018   NA 137 06/18/2018   K 4.3 06/18/2018   CL 103 06/18/2018   CREATININE 0.79 06/18/2018   BUN 15 06/18/2018   CO2 25 06/18/2018   TSH 1.01 06/18/2018   HGBA1C 7.2 (H) 06/18/2018   MICROALBUR 0.8 06/18/2018       Assessment & Plan:   Problem List Items Addressed This Visit    Anemia    Follow cbc.  Colonoscopy 2016.  Recommended f/u in 5 years.        Diabetes mellitus ([place])    Low carb diet and exercise.  On Jardiance and metformin.  Follow met b and a1c.  Hold on making changes in her medication.  Sugars improving.        Relevant Medications   atorvastatin (LIPITOR) 10 MG tablet   Elevated LFTs    Diet, exercise and weight loss.   Follow liver function tests.        GERD (gastroesophageal reflux disease)    Controlled on current regimen.        Hypercholesterolemia    Off crestor.  Did not tolerate.  Will start lipitor.  Low cholesterol diet and exercise.  Follow lipid panel and liver function tests.        Relevant Medications   atorvastatin (LIPITOR) 10 MG tablet   Other Relevant Orders   Hepatic function panel   Hypertension    Blood pressure as outlined.  Hold on additional medication.  Have her spot check her pressure.        Relevant Medications   atorvastatin (LIPITOR) 10 MG tablet   Obesity (BMI 30-39.9)    Discussed diet and exercise.  Follow.        Obstructive sleep apnea    Not using cpap.  Off ambien.  Have discussed with her regarding importance of treating sleep apnea.        Stress    Increased stress.  Discussed with her today.  Overall she feels she is handling things well.  Follow.            Einar Pheasant, MD

## 2018-06-24 ENCOUNTER — Encounter: Payer: Self-pay | Admitting: Internal Medicine

## 2018-06-24 NOTE — Assessment & Plan Note (Signed)
Not using cpap.  Off ambien.  Have discussed with her regarding importance of treating sleep apnea.   

## 2018-06-24 NOTE — Assessment & Plan Note (Signed)
Controlled on current regimen.   

## 2018-06-24 NOTE — Assessment & Plan Note (Signed)
Increased stress.  Discussed with her today.  Overall she feels she is handling things well.  Follow.   

## 2018-06-24 NOTE — Assessment & Plan Note (Signed)
Blood pressure as outlined.  Hold on additional medication.  Have her spot check her pressure.

## 2018-06-24 NOTE — Assessment & Plan Note (Signed)
Low carb diet and exercise.  On Jardiance and metformin.  Follow met b and a1c.  Hold on making changes in her medication.  Sugars improving.

## 2018-06-24 NOTE — Assessment & Plan Note (Addendum)
Off crestor.  Did not tolerate.  Will start lipitor.  Low cholesterol diet and exercise.  Follow lipid panel and liver function tests.

## 2018-06-24 NOTE — Assessment & Plan Note (Signed)
Diet, exercise and weight loss. Follow liver function tests.   

## 2018-06-24 NOTE — Assessment & Plan Note (Signed)
Follow cbc.  Colonoscopy 2016.  Recommended f/u in 5 years.   

## 2018-06-24 NOTE — Assessment & Plan Note (Signed)
Discussed diet and exercise.  Follow.  

## 2018-07-02 ENCOUNTER — Encounter: Payer: Self-pay | Admitting: Internal Medicine

## 2018-07-02 NOTE — Telephone Encounter (Signed)
Per appt review, she has a non fasting lab appt in 07/2018.  Is she aware of this appt?  Also, she will need a fasting lab scheduled prior to her 10/2018 appt.

## 2018-07-02 NOTE — Telephone Encounter (Signed)
Pt had labs done 06/18/18. Will she need labs prior to her appt in May

## 2018-08-03 ENCOUNTER — Other Ambulatory Visit (INDEPENDENT_AMBULATORY_CARE_PROVIDER_SITE_OTHER): Payer: BC Managed Care – PPO

## 2018-08-03 ENCOUNTER — Encounter: Payer: Self-pay | Admitting: Internal Medicine

## 2018-08-03 DIAGNOSIS — E7800 Pure hypercholesterolemia, unspecified: Secondary | ICD-10-CM

## 2018-08-03 LAB — HEPATIC FUNCTION PANEL
ALT: 29 U/L (ref 0–35)
AST: 22 U/L (ref 0–37)
Albumin: 4.3 g/dL (ref 3.5–5.2)
Alkaline Phosphatase: 86 U/L (ref 39–117)
Bilirubin, Direct: 0.1 mg/dL (ref 0.0–0.3)
Total Bilirubin: 0.4 mg/dL (ref 0.2–1.2)
Total Protein: 7.1 g/dL (ref 6.0–8.3)

## 2018-08-14 ENCOUNTER — Telehealth: Payer: Self-pay | Admitting: *Deleted

## 2018-08-14 MED ORDER — OSELTAMIVIR PHOSPHATE 75 MG PO CAPS: 75.0000 mg | ORAL_CAPSULE | Freq: Two times a day (BID) | ORAL | 0 refills | Status: DC

## 2018-08-14 NOTE — Telephone Encounter (Signed)
Sounds like the flu, the best I can do without seeing her is to cal lin tamiflu .  She can use tylenol an d motrin for the fevers and body aches,  If not better in 48 hours needs to be seen .  If she gets worse overnight needs to be soon to rule out pneumonia

## 2018-08-14 NOTE — Telephone Encounter (Signed)
Copied from CRM [id number]. Topic: General - Other >> Aug 14, 2018 12:26 PM Marylen Ponto wrote: Reason for CRM: Pt stated she needs Bethann Berkshire to return her call. Pt stated she does not want anyone other than Trisha to return her call.

## 2018-08-14 NOTE — Telephone Encounter (Signed)
Patient ask if there is anything for cough chest congestion , fever 101,started yesterday feels very weak and tired has vomiting.Does not think she has been around DX with flu but has been exposed to Flu like symptoms. Does not feel like driving to the office would like to know what she could take. Says she is just too weak to drive and aching badly. PCP out of office.

## 2018-08-15 NOTE — Telephone Encounter (Signed)
Left detailed message for patient advising below and to call if needed.

## 2018-09-10 ENCOUNTER — Other Ambulatory Visit: Payer: Self-pay | Admitting: Internal Medicine

## 2018-10-12 ENCOUNTER — Encounter: Payer: Self-pay | Admitting: Internal Medicine

## 2018-10-12 DIAGNOSIS — E119 Type 2 diabetes mellitus without complications: Secondary | ICD-10-CM

## 2018-10-12 DIAGNOSIS — N926 Irregular menstruation, unspecified: Secondary | ICD-10-CM

## 2018-10-12 DIAGNOSIS — E7800 Pure hypercholesterolemia, unspecified: Secondary | ICD-10-CM

## 2018-10-12 DIAGNOSIS — D649 Anemia, unspecified: Secondary | ICD-10-CM

## 2018-10-12 DIAGNOSIS — I10 Essential (primary) hypertension: Secondary | ICD-10-CM

## 2018-10-12 NOTE — Telephone Encounter (Signed)
Order placed for labs.

## 2018-10-17 ENCOUNTER — Other Ambulatory Visit: Payer: Self-pay

## 2018-10-17 ENCOUNTER — Other Ambulatory Visit (INDEPENDENT_AMBULATORY_CARE_PROVIDER_SITE_OTHER): Payer: BC Managed Care – PPO

## 2018-10-17 DIAGNOSIS — E119 Type 2 diabetes mellitus without complications: Secondary | ICD-10-CM

## 2018-10-17 DIAGNOSIS — N926 Irregular menstruation, unspecified: Secondary | ICD-10-CM | POA: Diagnosis not present

## 2018-10-17 DIAGNOSIS — E7800 Pure hypercholesterolemia, unspecified: Secondary | ICD-10-CM

## 2018-10-17 DIAGNOSIS — D649 Anemia, unspecified: Secondary | ICD-10-CM

## 2018-10-17 LAB — HEPATIC FUNCTION PANEL
ALT: 25 U/L (ref 0–35)
AST: 19 U/L (ref 0–37)
Albumin: 4 g/dL (ref 3.5–5.2)
Alkaline Phosphatase: 75 U/L (ref 39–117)
Bilirubin, Direct: 0.1 mg/dL (ref 0.0–0.3)
Total Bilirubin: 0.4 mg/dL (ref 0.2–1.2)
Total Protein: 6.7 g/dL (ref 6.0–8.3)

## 2018-10-17 LAB — CBC WITH DIFFERENTIAL/PLATELET
Basophils Absolute: 0 10*3/uL (ref 0.0–0.1)
Basophils Relative: 0.7 % (ref 0.0–3.0)
Eosinophils Absolute: 0.1 10*3/uL (ref 0.0–0.7)
Eosinophils Relative: 2.4 % (ref 0.0–5.0)
HCT: 41.6 % (ref 36.0–46.0)
Hemoglobin: 13.8 g/dL (ref 12.0–15.0)
Lymphocytes Relative: 35.5 % (ref 12.0–46.0)
Lymphs Abs: 1.8 10*3/uL (ref 0.7–4.0)
MCHC: 33.1 g/dL (ref 30.0–36.0)
MCV: 87.3 fl (ref 78.0–100.0)
Monocytes Absolute: 0.4 10*3/uL (ref 0.1–1.0)
Monocytes Relative: 7.7 % (ref 3.0–12.0)
Neutro Abs: 2.8 10*3/uL (ref 1.4–7.7)
Neutrophils Relative %: 53.7 % (ref 43.0–77.0)
Platelets: 227 10*3/uL (ref 150.0–400.0)
RBC: 4.77 Mil/uL (ref 3.87–5.11)
RDW: 13.7 % (ref 11.5–15.5)
WBC: 5.2 10*3/uL (ref 4.0–10.5)

## 2018-10-17 LAB — LIPID PANEL
Cholesterol: 154 mg/dL (ref 0–200)
HDL: 49.8 mg/dL (ref >39.00)
LDL Cholesterol: 71 mg/dL (ref 0–99)
NonHDL: 103.99
Total CHOL/HDL Ratio: 3
Triglycerides: 166 mg/dL — ABNORMAL HIGH (ref 0.0–149.0)
VLDL: 33.2 mg/dL (ref 0.0–40.0)

## 2018-10-17 LAB — FOLLICLE STIMULATING HORMONE: FSH: 33.6 m[IU]/mL

## 2018-10-17 LAB — BASIC METABOLIC PANEL
BUN: 14 mg/dL (ref 6–23)
CO2: 27 mEq/L (ref 19–32)
Calcium: 8.7 mg/dL (ref 8.4–10.5)
Chloride: 104 mEq/L (ref 96–112)
Creatinine, Ser: 0.78 mg/dL (ref 0.40–1.20)
GFR: 77.93 mL/min (ref >60.00)
Glucose, Bld: 133 mg/dL — ABNORMAL HIGH (ref 70–99)
Potassium: 4 mEq/L (ref 3.5–5.1)
Sodium: 139 mEq/L (ref 135–145)

## 2018-10-17 LAB — HEMOGLOBIN A1C: Hgb A1c MFr Bld: 7.3 % — ABNORMAL HIGH (ref 4.6–6.5)

## 2018-10-22 ENCOUNTER — Encounter: Payer: Self-pay | Admitting: Internal Medicine

## 2018-10-22 ENCOUNTER — Other Ambulatory Visit: Payer: Self-pay

## 2018-10-22 ENCOUNTER — Ambulatory Visit: Payer: BC Managed Care – PPO | Admitting: Internal Medicine

## 2018-10-22 ENCOUNTER — Ambulatory Visit (INDEPENDENT_AMBULATORY_CARE_PROVIDER_SITE_OTHER): Payer: BC Managed Care – PPO | Admitting: Internal Medicine

## 2018-10-22 DIAGNOSIS — E7800 Pure hypercholesterolemia, unspecified: Secondary | ICD-10-CM

## 2018-10-22 DIAGNOSIS — K219 Gastro-esophageal reflux disease without esophagitis: Secondary | ICD-10-CM | POA: Diagnosis not present

## 2018-10-22 DIAGNOSIS — R7989 Other specified abnormal findings of blood chemistry: Secondary | ICD-10-CM

## 2018-10-22 DIAGNOSIS — I10 Essential (primary) hypertension: Secondary | ICD-10-CM

## 2018-10-22 DIAGNOSIS — D649 Anemia, unspecified: Secondary | ICD-10-CM | POA: Diagnosis not present

## 2018-10-22 DIAGNOSIS — R945 Abnormal results of liver function studies: Secondary | ICD-10-CM | POA: Diagnosis not present

## 2018-10-22 DIAGNOSIS — N926 Irregular menstruation, unspecified: Secondary | ICD-10-CM

## 2018-10-22 DIAGNOSIS — E119 Type 2 diabetes mellitus without complications: Secondary | ICD-10-CM | POA: Diagnosis not present

## 2018-10-22 NOTE — Progress Notes (Addendum)
Patient ID: Anne [name], female   DOB: 20-Jan-1966, 53 y.o.   MRN: [id number]   Virtual Visit via video Note  This visit type was conducted due to national recommendations for restrictions regarding the COVID-19 pandemic (e.g. social distancing).  This format is felt to be most appropriate for this patient at this time.  All issues noted in this document were discussed and addressed.  No physical exam was performed (except for noted visual exam findings with Video Visits).   I connected with Otila Kluver "Christa See by a video enabled telemedicine application and verified that I am speaking with the correct person using two identifiers. Location patient: home Location provider: work Persons participating in the virtual visit: patient, provider  I discussed the limitations, risks, security and privacy concerns of performing an evaluation and management service by video and the availability of in person appointments.  The patient expressed understanding and agreed to proceed.   Reason for visit: scheduled follow up.   HPI: She reports she is doing relatively well.  She is off depo.  Last depo injection 12/16/17.  States had a period 3/12 - 08/20/18 and 3/29- 09/08/18.  No other bleeding.  Does report with her periods that she had back pain and increased gas/bloating.  Has noticed some discomfort last week - but no period.  No pain or bloating now.  Took Gas X.  Eating.  No chest pain. No sob.  No known COVID exposure.  Working from home.  No fever.  No cough or congestion or sob.  No acid reflux.  Occasional increased stool.  Was on crestor.  Caused migraines.  On lipitor now.  Tolerating.  Discussed lab results.  a1c 7.3.  Discussed medication changes.  She was agreeable to increase metformin.  Desired no other medication changes. Reviewed her outside blood pressure readings - averaging 120-130s/70-80s.     ROS: See pertinent positives and negatives per HPI.  Past Medical History:  Diagnosis  Date  . Anemia   . Diabetes mellitus ([place])   . Gastritis   . History of abnormal Pap smear    s/p LEEP, h/o HPV  . Hypercholesterolemia   . Hypertension   . IBS (irritable bowel syndrome)     Past Surgical History:  Procedure Laterality Date  . APPENDECTOMY    . BREAST BIOPSY Left 08/25/2017   left breast biopsy path pending  . COLONOSCOPY WITH PROPOFOL N/A 11/28/2014   Procedure: COLONOSCOPY WITH PROPOFOL;  Surgeon: Lollie Sails, MD;  Location[facility] ENDOSCOPY;  Service: Endoscopy;  Laterality: N/A;    Family History  Problem Relation Age of Onset  . Diabetes Paternal Grandmother   . Emphysema Paternal Grandfather   . Breast cancer Sister 63  . Colon cancer Neg Hx     SOCIAL HX: reviewed.    Current Outpatient Medications:  .  atorvastatin (LIPITOR) 10 MG tablet, TAKE 1 TABLET BY MOUTH EVERY DAY, Disp: 90 tablet, Rfl: 0 .  BIOTIN PO, Take 1 capsule by mouth daily. , Disp: , Rfl:  .  Blood Glucose Monitoring Suppl (CONTOUR BLOOD GLUCOSE SYSTEM) DEVI, Use to test blood sugar up to three times daily - Using [employer] System, Disp: 1 Device, Rfl: 0 .  fish oil-omega-3 fatty acids 1000 MG capsule, Take 1 g by mouth daily., Disp: , Rfl:  .  FLUoxetine (PROZAC) 20 MG capsule, TAKE 1 CAPSULE BY MOUTH EVERY DAY, Disp: 90 capsule, Rfl: 0 .  fluticasone (FLONASE) 50 MCG/ACT nasal spray, PLACE 2  SPRAYS INTO THE NOSE DAILY., Disp: 16 g, Rfl: 11 .  glucose blood (BAYER CONTOUR TEST) test strip, Use as instructed to test blood sugar three times daily - Using [employer] System, Disp: 100 each, Rfl: 12 .  JARDIANCE 25 MG TABS tablet, TAKE 25 MG BY MOUTH DAILY., Disp: 90 tablet, Rfl: 1 .  Lancet Devices (MICROLET NEXT LANCING DEVICE) MISC, 1 each by Does not apply route daily., Disp: 1 each, Rfl: 0 .  metFORMIN (GLUCOPHAGE-XR) 500 MG 24 hr tablet, TAKE 2 TABLETS BY MOUTH TWICE A DAY, Disp: 360 tablet, Rfl: 1 .  MICROLET LANCETS MISC, 1 each by Does not apply route 3 (three)  times daily., Disp: 100 each, Rfl: 11 .  Multiple Vitamin (MULTIVITAMIN) tablet, Take 1 tablet by mouth daily., Disp: , Rfl:  .  pantoprazole (PROTONIX) 40 MG tablet, TAKE 1 TABLET (40 MG TOTAL) BY MOUTH DAILY., Disp: 90 tablet, Rfl: 2  EXAM:  VITALS per patient if applicable: 438/88  GENERAL: alert, oriented, appears well and in no acute distress  HEENT: atraumatic, conjunttiva clear, no obvious abnormalities on inspection of external nose and ears  NECK: normal movements of the head and neck  LUNGS: on inspection no signs of respiratory distress, breathing rate appears normal, no obvious gross SOB, gasping or wheezing  CV: no obvious cyanosis  PSYCH/NEURO: pleasant and cooperative, no obvious depression or anxiety, speech and thought processing grossly intact  ASSESSMENT AND PLAN:  Discussed the following assessment and plan:  Anemia, unspecified type  Type 2 diabetes mellitus without complication, without long-term current use of insulin (HCC) - Plan: Hemoglobin L5Z, Basic metabolic panel  Elevated LFTs  Gastroesophageal reflux disease, esophagitis presence not specified  Hypercholesterolemia - Plan: Hepatic function panel, Lipid panel  Essential hypertension  Menstrual abnormality  Anemia Colonoscopy 2016.  Recommended f/u in 5 years.  Follow cbc.   Diabetes mellitus a1c 7.3.  Discussed with her today.  She agreed to increase metformin.  Desired no other medication changes.  Low carb diet and exercise.  Follow met b and a1c.    Elevated LFTs Diet, exercise and weight loss.  Recent liver panel wnl.    GERD (gastroesophageal reflux disease) Controlled on current regimen.  Follow.    Hypercholesterolemia On lipitor.  Tolerating.  Desires no change.  Follow lipid panel and liver function tests.  Low cholesterol diet and exercise.    Hypertension Blood pressure as outlined.  Continue same medications.  Follow pressures.  Follow metabolic panel.   Menstrual  abnormality Last depo 12/2018.  Periods as outlined.  Discussed with her today.  She will keep menstrual diary.  Follow. She feels her abdominal discomfort, gas and low back pain related to her period.  Desires no further intervention at this time.  Follow.      I discussed the assessment and treatment plan with the patient. The patient was provided an opportunity to ask questions and all were answered. The patient agreed with the plan and demonstrated an understanding of the instructions.   The patient was advised to call back or seek an in-person evaluation if the symptoms worsen or if the condition fails to improve as anticipated.  I provided 30 minutes of non-face-to-face time during this encounter.   Einar Pheasant, MD

## 2018-10-27 ENCOUNTER — Encounter: Payer: Self-pay | Admitting: Internal Medicine

## 2018-10-27 DIAGNOSIS — N926 Irregular menstruation, unspecified: Secondary | ICD-10-CM | POA: Insufficient documentation

## 2018-10-27 NOTE — Assessment & Plan Note (Signed)
On lipitor.  Tolerating.  Desires no change.  Follow lipid panel and liver function tests.  Low cholesterol diet and exercise.

## 2018-10-27 NOTE — Assessment & Plan Note (Signed)
Blood pressure as outlined.  Continue same medications.  Follow pressures.  Follow metabolic panel.   

## 2018-10-27 NOTE — Assessment & Plan Note (Signed)
Last depo 12/2018.  Periods as outlined.  Discussed with her today.  She will keep menstrual diary.  Follow. She feels her abdominal discomfort, gas and low back pain related to her period.  Desires no further intervention at this time.  Follow.

## 2018-10-27 NOTE — Assessment & Plan Note (Signed)
Controlled on current regimen.  Follow.  

## 2018-10-27 NOTE — Assessment & Plan Note (Signed)
Colonoscopy 2016.  Recommended f/u in 5 years.  Follow cbc.

## 2018-10-27 NOTE — Assessment & Plan Note (Signed)
Diet, exercise and weight loss.  Recent liver panel wnl.  

## 2018-10-27 NOTE — Assessment & Plan Note (Signed)
a1c 7.3.  Discussed with her today.  She agreed to increase metformin.  Desired no other medication changes.  Low carb diet and exercise.  Follow met b and a1c.

## 2018-11-12 ENCOUNTER — Encounter: Payer: Self-pay | Admitting: Internal Medicine

## 2018-12-04 ENCOUNTER — Other Ambulatory Visit: Payer: Self-pay | Admitting: Internal Medicine

## 2018-12-05 ENCOUNTER — Other Ambulatory Visit: Payer: Self-pay | Admitting: Internal Medicine

## 2018-12-15 IMAGING — MG US BREAST BX W LOC DEV 1ST LESION IMG BX SPEC US GUIDE*L*
1 series · 8 of 8 positions shown · non-contrast
Comparison: Previous exam(s).

ADDENDUM:
Pathology of the left breast biopsy revealed A. BREAST, LEFT 430;
ULTRASOUND-GUIDED BIOPSY: USUAL DUCTAL HYPERPLASIA, COLUMNAR CELL
CHANGE, APOCRINE METAPLASIA, AND FOCAL CYST FORMATION. REACTIVE
STROMAL CHANGES SUGGESTIVE OF PRIOR CYST RUPTURE. NEGATIVE FOR
ATYPIA AND MALIGNANCY.

This was found to be concordant by Dr. Ferkyn J.
Recommendation: Return to routine screening mammography in one year.
The patient was contacted by phone by Mahoro, Per-Arne on 08/28/17
at [DATE] for a post biopsy site check. The patient stated she did
well following the biopsy with a small bruise but no pain. Post
biopsy instructions were reviewed with the patient and all of her
questions were answered. At the time of conversation she had not
spoken to Dr. Lujan regarding results. Results and recommendations
from Dr. Ferkyn J were relayed to the patient, but she was encouraged
to follow-up with Dr. Lujan as he may have other recommendations.
She was encouraged to contact the [HOSPITAL] with any
further questions or concerns.
Addendum by Mahoro, Per-Arne on 08/29/17.
CLINICAL DATA: Patient with left breast mass presents today for
ultrasound-guided core biopsy.
EXAM:
ULTRASOUND GUIDED LEFT BREAST CORE NEEDLE BIOPSY

[Series 1: MG view · 0.05mm/px · 8 of 11 slices shown]
[im 1/11]
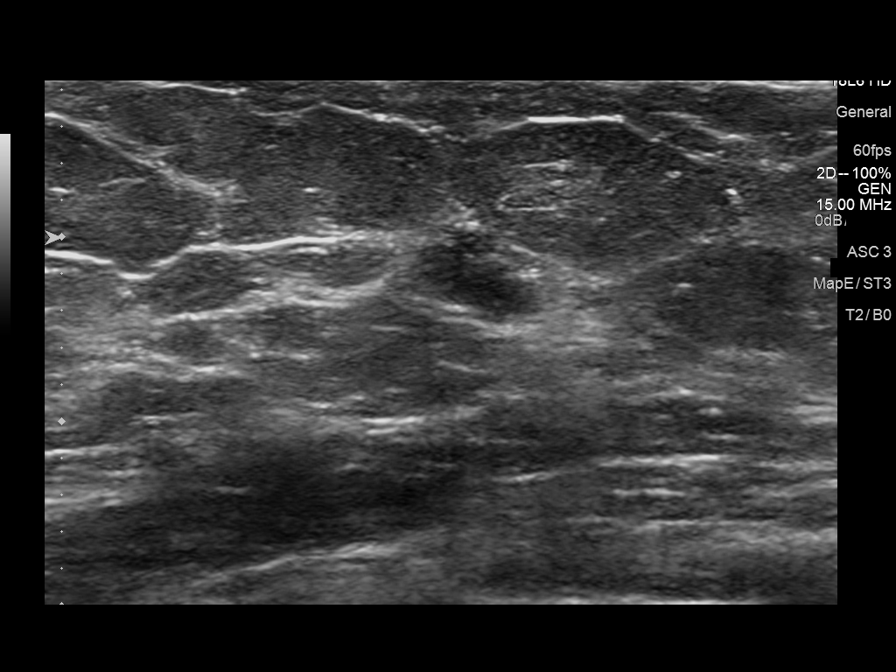
[im 2/11]
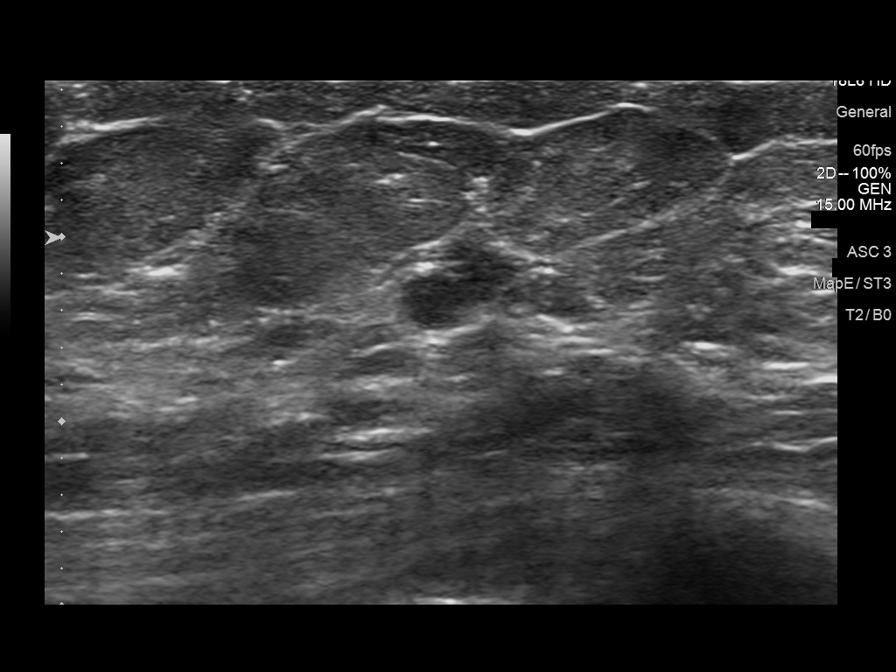
[im 3/11]
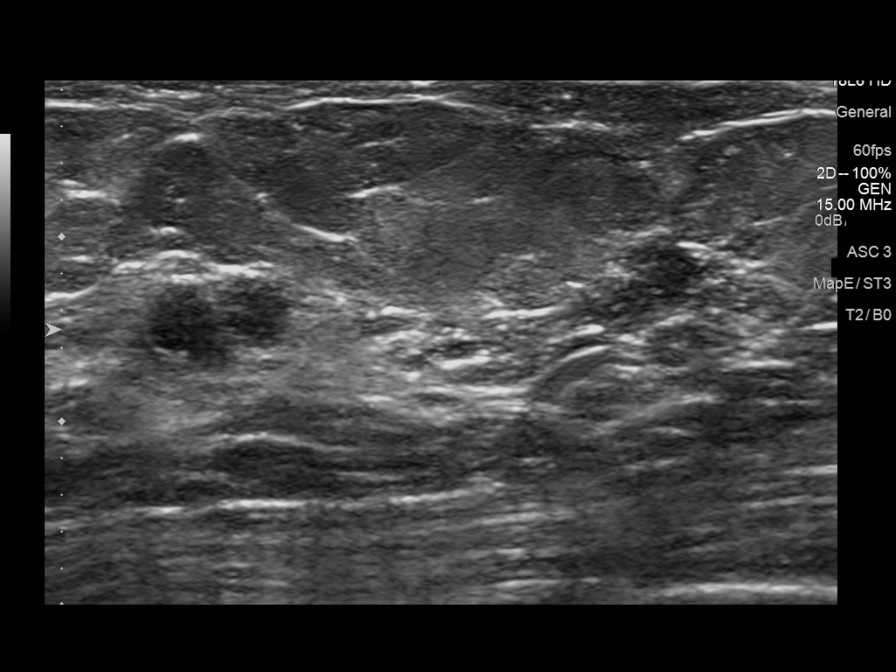
[im 5/11]
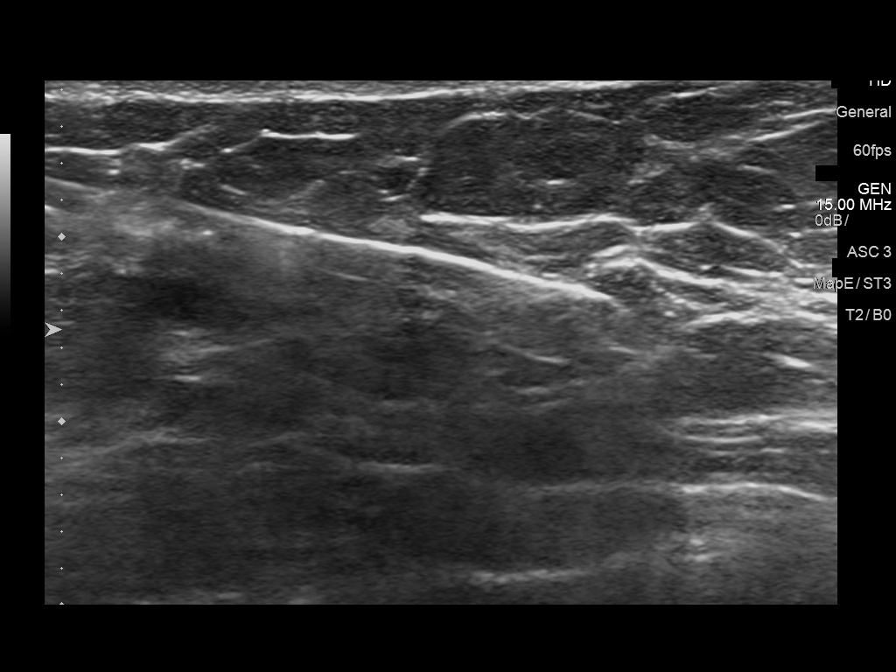
[im 6/11]
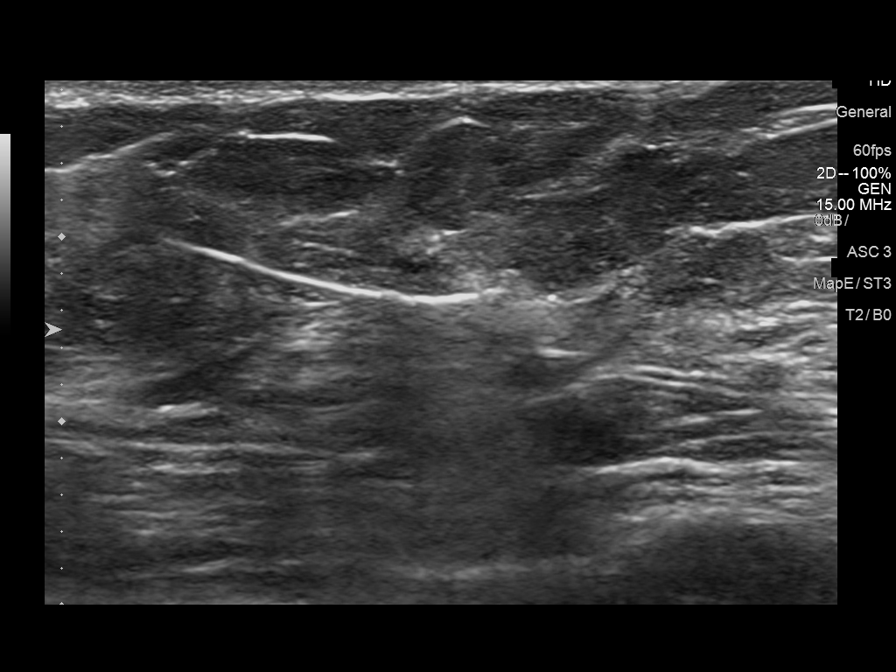
[im 8/11]
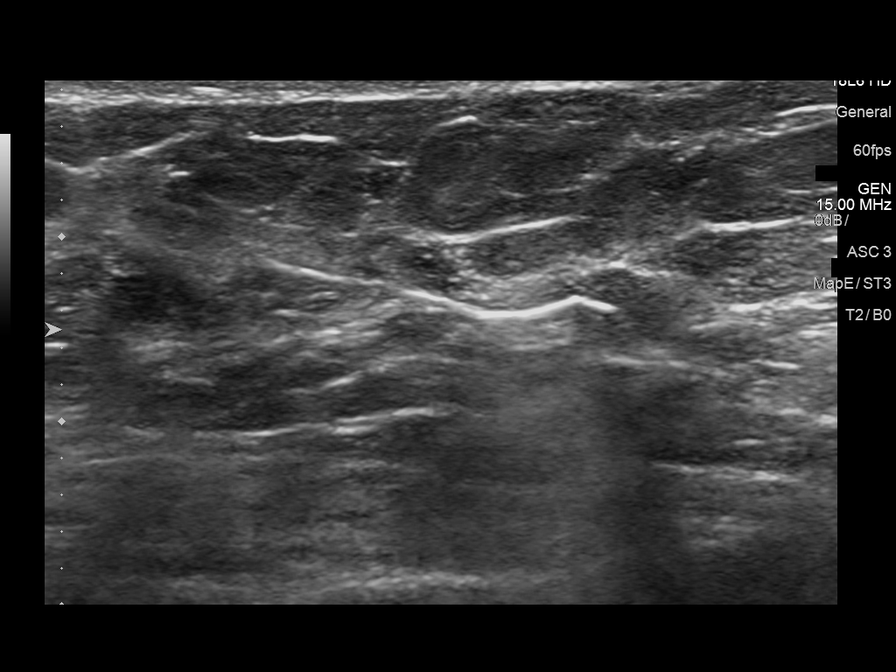
[im 9/11]
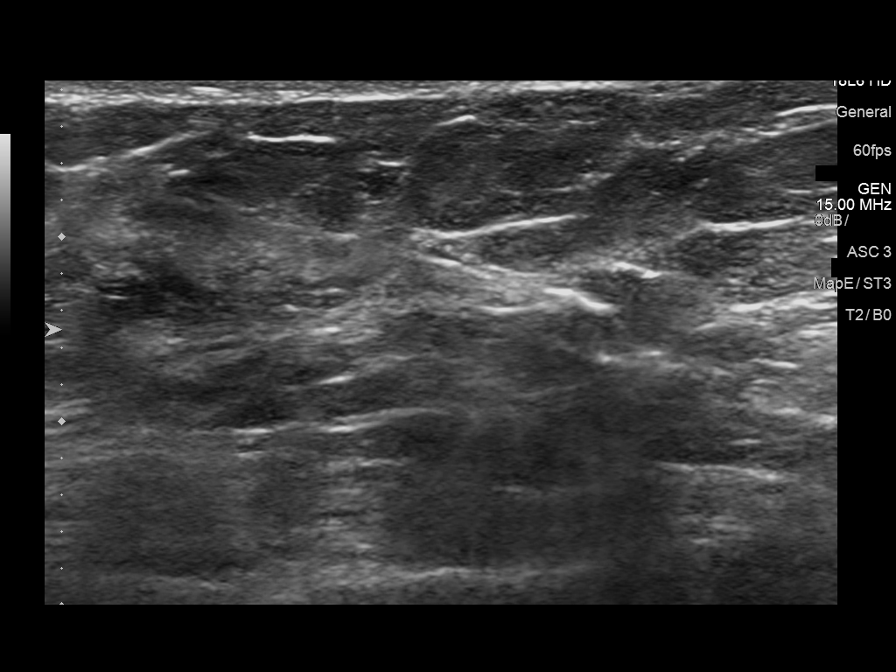
[im 11/11]
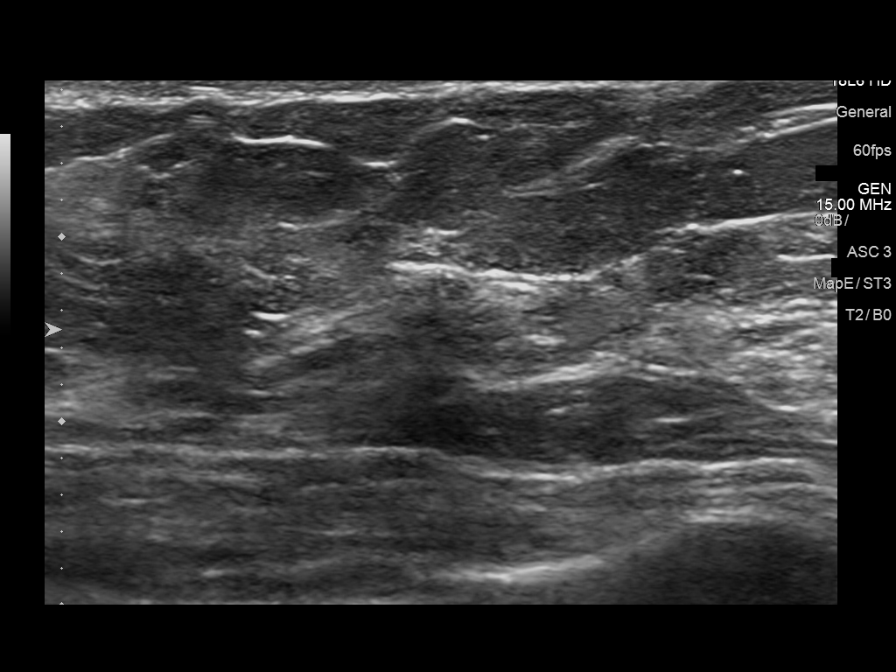

[8 of 8 positions shown; findings below may reference images not displayed]

PROCEDURE:
I met with the patient and we discussed the procedure of
ultrasound-guided biopsy, including benefits and alternatives. We
discussed the high likelihood of a successful procedure. We
discussed the risks of the procedure including infection, bleeding,
tissue injury, clip migration, and inadequate sampling. Informed
written consent was given. The usual time-out protocol was performed
immediately prior to the procedure.

Lesion quadrant: Lower outer quadrant

Using sterile technique and 1% Lidocaine as local anesthetic, under
direct ultrasound visualization, a 12 gauge Ishmaelina device was
used to perform biopsy of the left breast mass at the 4:30 o'clock
axisusing a lateral approach. At the conclusion of the procedure, a
heart shaped tissue marker clip was deployed into the biopsy cavity.
Follow-up 2-view mammogram was performed and dictated separately.
IMPRESSION: Ultrasound-guided biopsy of left breast mass at the 4:30 o'clock
axis. No apparent complications.

## 2019-03-05 ENCOUNTER — Other Ambulatory Visit: Payer: Self-pay | Admitting: Internal Medicine

## 2019-03-05 DIAGNOSIS — E119 Type 2 diabetes mellitus without complications: Secondary | ICD-10-CM

## 2019-03-08 ENCOUNTER — Other Ambulatory Visit: Payer: BC Managed Care – PPO

## 2019-03-11 ENCOUNTER — Ambulatory Visit: Payer: BC Managed Care – PPO | Admitting: Internal Medicine

## 2019-03-15 ENCOUNTER — Other Ambulatory Visit: Payer: Self-pay | Admitting: Internal Medicine

## 2019-03-15 DIAGNOSIS — Z1231 Encounter for screening mammogram for malignant neoplasm of breast: Secondary | ICD-10-CM

## 2019-03-27 ENCOUNTER — Encounter: Payer: Self-pay | Admitting: Internal Medicine

## 2019-03-28 ENCOUNTER — Other Ambulatory Visit: Payer: Self-pay | Admitting: Internal Medicine

## 2019-03-28 DIAGNOSIS — N926 Irregular menstruation, unspecified: Secondary | ICD-10-CM

## 2019-03-28 NOTE — Telephone Encounter (Signed)
I have added the fsh.  Do we have the eye information.

## 2019-03-28 NOTE — Telephone Encounter (Signed)
There is another message on this patient that did not get routed to you because I responded to her. Are you okay with adding a [place] on her other labs she is having done 04/09/19?

## 2019-03-28 NOTE — Progress Notes (Signed)
Order placed for [facility].

## 2019-04-04 ENCOUNTER — Other Ambulatory Visit: Payer: Self-pay

## 2019-04-08 ENCOUNTER — Other Ambulatory Visit (INDEPENDENT_AMBULATORY_CARE_PROVIDER_SITE_OTHER): Payer: BC Managed Care – PPO

## 2019-04-08 ENCOUNTER — Other Ambulatory Visit: Payer: Self-pay

## 2019-04-08 DIAGNOSIS — E7800 Pure hypercholesterolemia, unspecified: Secondary | ICD-10-CM

## 2019-04-08 DIAGNOSIS — N926 Irregular menstruation, unspecified: Secondary | ICD-10-CM | POA: Diagnosis not present

## 2019-04-08 DIAGNOSIS — E119 Type 2 diabetes mellitus without complications: Secondary | ICD-10-CM

## 2019-04-08 LAB — LIPID PANEL
Cholesterol: 150 mg/dL (ref 0–200)
HDL: 42.9 mg/dL (ref >39.00)
LDL Cholesterol: 69 mg/dL (ref 0–99)
NonHDL: 106.92
Total CHOL/HDL Ratio: 3
Triglycerides: 192 mg/dL — ABNORMAL HIGH (ref 0.0–149.0)
VLDL: 38.4 mg/dL (ref 0.0–40.0)

## 2019-04-08 LAB — BASIC METABOLIC PANEL
BUN: 12 mg/dL (ref 6–23)
CO2: 23 mEq/L (ref 19–32)
Calcium: 8.9 mg/dL (ref 8.4–10.5)
Chloride: 105 mEq/L (ref 96–112)
Creatinine, Ser: 0.77 mg/dL (ref 0.40–1.20)
GFR: 78.95 mL/min (ref >60.00)
Glucose, Bld: 132 mg/dL — ABNORMAL HIGH (ref 70–99)
Potassium: 4 mEq/L (ref 3.5–5.1)
Sodium: 140 mEq/L (ref 135–145)

## 2019-04-08 LAB — HEPATIC FUNCTION PANEL
ALT: 20 U/L (ref 0–35)
AST: 18 U/L (ref 0–37)
Albumin: 3.9 g/dL (ref 3.5–5.2)
Alkaline Phosphatase: 69 U/L (ref 39–117)
Bilirubin, Direct: 0 mg/dL (ref 0.0–0.3)
Total Bilirubin: 0.2 mg/dL (ref 0.2–1.2)
Total Protein: 6.4 g/dL (ref 6.0–8.3)

## 2019-04-08 LAB — FOLLICLE STIMULATING HORMONE: FSH: 38.3 m[IU]/mL

## 2019-04-08 LAB — HEMOGLOBIN A1C: Hgb A1c MFr Bld: 6.8 % — ABNORMAL HIGH (ref 4.6–6.5)

## 2019-04-09 ENCOUNTER — Ambulatory Visit
Admission: RE | Admit: 2019-04-09 | Discharge: 2019-04-09 | Disposition: A | Discharge status: Home / Self Care | Payer: BC Managed Care – PPO | Source: Ambulatory Visit | Attending: Internal Medicine | Admitting: Internal Medicine

## 2019-04-09 ENCOUNTER — Other Ambulatory Visit: Payer: Self-pay

## 2019-04-09 DIAGNOSIS — Z1231 Encounter for screening mammogram for malignant neoplasm of breast: Secondary | ICD-10-CM | POA: Diagnosis not present

## 2019-04-11 ENCOUNTER — Ambulatory Visit: Payer: BC Managed Care – PPO | Admitting: Internal Medicine

## 2019-04-11 ENCOUNTER — Other Ambulatory Visit (HOSPITAL_COMMUNITY)
Admission: RE | Admit: 2019-04-11 | Discharge: 2019-04-11 | Disposition: A | Discharge status: Home / Self Care | Payer: BC Managed Care – PPO | Source: Ambulatory Visit | Attending: Internal Medicine | Admitting: Internal Medicine

## 2019-04-11 ENCOUNTER — Other Ambulatory Visit: Payer: Self-pay

## 2019-04-11 ENCOUNTER — Encounter: Payer: Self-pay | Admitting: Internal Medicine

## 2019-04-11 VITALS — BP 120/78 | HR 76 | Temp 97.5°F | Resp 16 | Ht 63.0 in | Wt 184.6 lb

## 2019-04-11 DIAGNOSIS — Z124 Encounter for screening for malignant neoplasm of cervix: Secondary | ICD-10-CM | POA: Diagnosis not present

## 2019-04-11 DIAGNOSIS — I10 Essential (primary) hypertension: Secondary | ICD-10-CM

## 2019-04-11 DIAGNOSIS — K219 Gastro-esophageal reflux disease without esophagitis: Secondary | ICD-10-CM | POA: Diagnosis not present

## 2019-04-11 DIAGNOSIS — E119 Type 2 diabetes mellitus without complications: Secondary | ICD-10-CM

## 2019-04-11 DIAGNOSIS — D649 Anemia, unspecified: Secondary | ICD-10-CM | POA: Diagnosis not present

## 2019-04-11 DIAGNOSIS — F439 Reaction to severe stress, unspecified: Secondary | ICD-10-CM

## 2019-04-11 DIAGNOSIS — E7800 Pure hypercholesterolemia, unspecified: Secondary | ICD-10-CM

## 2019-04-11 MED ORDER — FLUOXETINE HCL 10 MG PO CAPS: ORAL_CAPSULE | ORAL | 0 refills | Status: DC

## 2019-04-11 NOTE — Progress Notes (Signed)
Patient ID: Anne [name], female   DOB: 21-Sep-1965, 53 y.o.   MRN: [id number]   Subjective:    Patient ID: Anne [name], female    DOB: 27-Feb-1966, 53 y.o.   MRN: [id number]  HPI  Patient here for a scheduled follow up.  Retired 02/05/19.  Feels good. Has changed her diet.  Lost weight.  Feels better. Discussed labs.  Sugars better.  Blood pressure doing well.  Her outside checks averaging 120-low 130s/70-80s. No chest pain.  Staying active.  Plans to start exercising more.  No sob.  No acid reflux.  No abdominal pain.  Bowels moving.  Periods irregular.  Had period beginning of 09/2018.  No period until 02/2019.  Last 03/2019.  Handling stress.  Request 87m tablets to have to taper.     Past Medical History:  Diagnosis Date  . Anemia   . Diabetes [place] ([place]   . Gastritis   . History of abnormal Pap smear    s/p LEEP, h/o HPV  . Hypercholesterolemia   . Hypertension   . IBS (irritable bowel syndrome)    Past Surgical History:  Procedure Laterality Date  . APPENDECTOMY    . BREAST BIOPSY Left 08/25/2017   left breast biopsy path pending  . COLONOSCOPY WITH PROPOFOL N/A 11/28/2014   Procedure: COLONOSCOPY WITH PROPOFOL;  Surgeon: MLollie Sails MD;  [facility];  Service: Endoscopy;  Laterality: N/A;   Family History  Problem Relation Age of Onset  . Diabetes Paternal Grandmother   . Emphysema Paternal Grandfather   . Breast cancer Sister 475 . Colon cancer Neg Hx    Social History   Socioeconomic History  . Marital status: Single    Spouse name: Not on file  . Number of children: 0  . Years of education: Not on file  . Highest education level: Not on file  Occupational History  . Not on file  Social Needs  . Financial resource strain: Not on file  . Food insecurity    Worry: Not on file    Inability: Not on file  . Transportation needs    Medical: Not on file    Non-medical: Not on file  Tobacco Use  . Smoking status: Never Smoker   . Smokeless tobacco: Never Used  Substance and Sexual Activity  . Alcohol use: No    Alcohol/week: 0.0 standard drinks  . Drug use: No  . Sexual activity: Not on file  Lifestyle  . Physical activity    Days per week: Not on file    Minutes per session: Not on file  . Stress: Not on file  Relationships  . Social [place]: Not on file    Gets together: Not on file    Attends religious service: Not on file    Active member of club or organization: Not on file    Attends meetings of clubs or organizations: Not on file    Relationship status: Not on file  Other Topics Concern  . Not on file  Social History Narrative  . Not on file    Outpatient Encounter Medications as of 04/11/2019  Medication Sig  . atorvastatin (LIPITOR) 10 MG tablet TAKE 1 TABLET BY MOUTH EVERY DAY  . BIOTIN PO Take 1 capsule by mouth daily.   . Blood Glucose Monitoring Suppl (CONTOUR BLOOD GLUCOSE SYSTEM) DEVI Use to test blood sugar up to three times daily - Using [employer] .  fish oil-omega-3 fatty acids 1000 MG capsule Take 1 g by mouth daily.  Marland Kitchen FLUoxetine (PROZAC) 10 MG capsule Take 1-2 tablets q day  . glucose blood (BAYER CONTOUR TEST) test strip Use as instructed to test blood sugar three times daily - Using [employer]  . JARDIANCE 25 MG TABS tablet TAKE 25 MG BY MOUTH DAILY.  Marland Kitchen Lancet Devices (MICROLET NEXT LANCING DEVICE) MISC 1 each by Does not apply route daily.  . metFORMIN (GLUCOPHAGE-XR) 500 MG 24 hr tablet TAKE 2 TABLETS BY MOUTH TWICE A DAY  . MICROLET LANCETS MISC 1 each by Does not apply route 3 (three) times daily.  . Multiple Vitamin (MULTIVITAMIN) tablet Take 1 tablet by mouth daily.  . pantoprazole (PROTONIX) 40 MG tablet TAKE 1 TABLET BY MOUTH EVERY DAY  . [DISCONTINUED] FLUoxetine (PROZAC) 20 MG capsule TAKE 1 CAPSULE BY MOUTH EVERY DAY  . [DISCONTINUED] fluticasone (FLONASE) 50 MCG/ACT nasal spray PLACE 2 SPRAYS INTO THE NOSE DAILY.   No  facility-administered encounter medications on file as of 04/11/2019.    Review of Systems  Constitutional: Negative for appetite change and unexpected weight change.  HENT: Negative for congestion and sinus pressure.   Respiratory: Negative for cough, chest tightness and shortness of breath.   Cardiovascular: Negative for chest pain, palpitations and leg swelling.  Gastrointestinal: Negative for abdominal pain, diarrhea, nausea and vomiting.  Genitourinary: Negative for difficulty urinating and dysuria.  Musculoskeletal: Negative for joint swelling and myalgias.  Skin: Negative for color change and rash.  Neurological: Negative for dizziness, light-headedness and headaches.  Psychiatric/Behavioral: Negative for agitation and dysphoric mood.       Objective:    Physical Exam Constitutional:      General: She is not in acute distress.    Appearance: Normal appearance. She is well-developed.  HENT:     Head: Normocephalic and atraumatic.     Right Ear: External ear normal.     Left Ear: External ear normal.  Eyes:     General: No scleral icterus.       Right eye: No discharge.        Left eye: No discharge.     Conjunctiva/sclera: Conjunctivae normal.  Neck:     Musculoskeletal: Neck supple. No muscular tenderness.     Thyroid: No thyromegaly.  Cardiovascular:     Rate and Rhythm: Normal rate and regular rhythm.  Pulmonary:     Effort: No tachypnea, accessory muscle usage or respiratory distress.     Breath sounds: Normal breath sounds. No decreased breath sounds or wheezing.  Chest:     Breasts:        Right: No inverted nipple, mass, nipple discharge or tenderness (no axillary adenopathy).        Left: No inverted nipple, mass, nipple discharge or tenderness (no axilarry adenopathy).  Abdominal:     General: Bowel sounds are normal.     Palpations: Abdomen is soft.     Tenderness: There is no abdominal tenderness.  Genitourinary:    Comments: Normal external genitalia.   Vaginal vault without lesions.  Cervix identified.  Pap smear performed.  Could not appreciate any adnexal masses or tenderness.   Musculoskeletal:        General: No swelling or tenderness.  Lymphadenopathy:     Cervical: No cervical adenopathy.  Skin:    Findings: No erythema or rash.  Neurological:     Mental Status: She is alert and oriented to person, place, and time.  Psychiatric:        Mood and Affect: Mood normal.        Behavior: Behavior normal.     BP 120/78   Pulse 76   Temp (!) 97.5 F (36.4 C)   Resp 16   Ht 5' 3"  (1.6 m)   Wt 184 lb 9.6 oz (83.7 kg)   LMP 03/14/2019 (Approximate)   SpO2 98%   BMI 32.70 kg/m  Wt Readings from Last 3 Encounters:  04/11/19 184 lb 9.6 oz (83.7 kg)  06/22/18 191 lb 12.8 oz (87 kg)  02/16/18 188 lb 3.2 oz (85.4 kg)     Lab Results  Component Value Date   WBC 5.2 10/17/2018   HGB 13.8 10/17/2018   HCT 41.6 10/17/2018   PLT 227.0 10/17/2018   GLUCOSE 132 (H) 04/08/2019   CHOL 150 04/08/2019   TRIG 192.0 (H) 04/08/2019   HDL 42.90 04/08/2019   LDLDIRECT 165.0 06/18/2018   LDLCALC 69 04/08/2019   ALT 20 04/08/2019   AST 18 04/08/2019   NA 140 04/08/2019   K 4.0 04/08/2019   CL 105 04/08/2019   CREATININE 0.77 04/08/2019   BUN 12 04/08/2019   CO2 23 04/08/2019   TSH 1.01 06/18/2018   HGBA1C 6.8 (H) 04/08/2019   MICROALBUR 0.8 06/18/2018    Mm 3d Screen Breast Bilateral  Result Date: 04/09/2019 CLINICAL DATA:  Screening. EXAM: DIGITAL SCREENING BILATERAL MAMMOGRAM WITH TOMO AND CAD COMPARISON:  Previous exam(s). ACR Breast Density Category c: The breast tissue is heterogeneously dense, which may obscure small masses. FINDINGS: There are no findings suspicious for malignancy. Images were processed with CAD. IMPRESSION: No mammographic evidence of malignancy. A result letter of this screening mammogram will be mailed directly to the patient. RECOMMENDATION: Screening mammogram in one year. (Code:SM-B-01Y) BI-RADS CATEGORY   1: Negative. Electronically Signed   By: Lajean Manes M.D.   On: 04/09/2019 14:14       Assessment & Plan:   Problem List Items Addressed This Visit    Anemia    Follow cbc.  Colonoscopy 2016.  Recommended f/u in 5 years.        Diabetes mellitus ([place])    a1c improved 6.8.  Continue low carb diet and exercise.  Follow met b and a1c.       Relevant Orders   Hemoglobin V7Q   Basic metabolic panel (future)   DM Microalbumin / creatinine urine ratio   GERD (gastroesophageal reflux disease)    Controlled on current regimen.  Follow.        Hypercholesterolemia    On lipitor.  Low cholesterol diet and exercise.  Follow lipid panel and liver function tests.       Relevant Orders   Lipid panel   Hepatic function panel   Hypertension    Blood pressure under good control.  Continue same medication regimen.  Follow pressures.  Follow metabolic panel.        Relevant Orders   TSH   Stress    Handling stress.  On prozac.  Request 80m tablets to have - to taper.  Follow.         Other Visit Diagnoses    Cervical cancer screening    -  Primary   Relevant Orders   Cytology - PAP( [place])       CEinar Pheasant MD

## 2019-04-14 ENCOUNTER — Encounter: Payer: Self-pay | Admitting: Internal Medicine

## 2019-04-14 NOTE — Assessment & Plan Note (Signed)
Follow cbc.  Colonoscopy 2016.  Recommended f/u in 5 years.

## 2019-04-14 NOTE — Assessment & Plan Note (Signed)
a1c improved 6.8.  Continue low carb diet and exercise.  Follow met b and a1c.

## 2019-04-14 NOTE — Assessment & Plan Note (Signed)
Handling stress.  On prozac.  Request 10mg  tablets to have - to taper.  Follow.

## 2019-04-14 NOTE — Assessment & Plan Note (Signed)
Blood pressure under good control.  Continue same medication regimen.  Follow pressures.  Follow metabolic panel.   

## 2019-04-14 NOTE — Assessment & Plan Note (Signed)
On lipitor.  Low cholesterol diet and exercise.  Follow lipid panel and liver function tests.   

## 2019-04-14 NOTE — Assessment & Plan Note (Signed)
Controlled on current regimen.  Follow.  

## 2019-04-17 ENCOUNTER — Encounter: Payer: Self-pay | Admitting: Internal Medicine

## 2019-04-17 LAB — CYTOLOGY - PAP
Comment: NEGATIVE
Diagnosis: NEGATIVE
High risk HPV: NEGATIVE

## 2019-05-04 ENCOUNTER — Other Ambulatory Visit: Payer: Self-pay | Admitting: Internal Medicine

## 2019-05-18 ENCOUNTER — Other Ambulatory Visit: Payer: Self-pay | Admitting: Internal Medicine

## 2019-05-28 ENCOUNTER — Encounter: Payer: Self-pay | Admitting: Internal Medicine

## 2019-06-13 ENCOUNTER — Encounter: Payer: Self-pay | Admitting: Internal Medicine

## 2019-08-18 ENCOUNTER — Other Ambulatory Visit: Payer: Self-pay | Admitting: Internal Medicine

## 2019-08-18 DIAGNOSIS — E119 Type 2 diabetes mellitus without complications: Secondary | ICD-10-CM

## 2019-10-14 ENCOUNTER — Other Ambulatory Visit (INDEPENDENT_AMBULATORY_CARE_PROVIDER_SITE_OTHER): Payer: BC Managed Care – PPO

## 2019-10-14 ENCOUNTER — Other Ambulatory Visit: Payer: Self-pay

## 2019-10-14 DIAGNOSIS — E119 Type 2 diabetes mellitus without complications: Secondary | ICD-10-CM

## 2019-10-14 DIAGNOSIS — I10 Essential (primary) hypertension: Secondary | ICD-10-CM | POA: Diagnosis not present

## 2019-10-14 DIAGNOSIS — E7800 Pure hypercholesterolemia, unspecified: Secondary | ICD-10-CM | POA: Diagnosis not present

## 2019-10-14 LAB — HEPATIC FUNCTION PANEL
ALT: 21 U/L (ref 0–35)
AST: 18 U/L (ref 0–37)
Albumin: 4.3 g/dL (ref 3.5–5.2)
Alkaline Phosphatase: 62 U/L (ref 39–117)
Bilirubin, Direct: 0 mg/dL (ref 0.0–0.3)
Total Bilirubin: 0.3 mg/dL (ref 0.2–1.2)
Total Protein: 7.5 g/dL (ref 6.0–8.3)

## 2019-10-14 LAB — BASIC METABOLIC PANEL
BUN: 22 mg/dL (ref 6–23)
CO2: 26 mEq/L (ref 19–32)
Calcium: 9.6 mg/dL (ref 8.4–10.5)
Chloride: 102 mEq/L (ref 96–112)
Creatinine, Ser: 0.84 mg/dL (ref 0.40–1.20)
GFR: 71.26 mL/min (ref >60.00)
Glucose, Bld: 151 mg/dL — ABNORMAL HIGH (ref 70–99)
Potassium: 4 mEq/L (ref 3.5–5.1)
Sodium: 135 mEq/L (ref 135–145)

## 2019-10-14 LAB — MICROALBUMIN / CREATININE URINE RATIO
Creatinine,U: 107.9 mg/dL
Microalb Creat Ratio: 1 mg/g (ref 0.0–30.0)
Microalb, Ur: 1 mg/dL (ref 0.0–1.9)

## 2019-10-14 LAB — LIPID PANEL
Cholesterol: 183 mg/dL (ref 0–200)
HDL: 40.8 mg/dL (ref >39.00)
NonHDL: 142.34
Total CHOL/HDL Ratio: 4
Triglycerides: 227 mg/dL — ABNORMAL HIGH (ref 0.0–149.0)
VLDL: 45.4 mg/dL — ABNORMAL HIGH (ref 0.0–40.0)

## 2019-10-14 LAB — TSH: TSH: 1.24 u[IU]/mL (ref 0.35–4.50)

## 2019-10-14 LAB — LDL CHOLESTEROL, DIRECT: Direct LDL: 103 mg/dL

## 2019-10-14 LAB — HEMOGLOBIN A1C: Hgb A1c MFr Bld: 7 % — ABNORMAL HIGH (ref 4.6–6.5)

## 2019-10-17 ENCOUNTER — Encounter: Payer: Self-pay | Admitting: Internal Medicine

## 2019-10-17 ENCOUNTER — Other Ambulatory Visit: Payer: Self-pay

## 2019-10-17 ENCOUNTER — Ambulatory Visit (INDEPENDENT_AMBULATORY_CARE_PROVIDER_SITE_OTHER): Payer: BC Managed Care – PPO | Admitting: Internal Medicine

## 2019-10-17 VITALS — BP 110/84 | HR 77 | Temp 97.1°F | Ht 62.99 in | Wt 178.4 lb

## 2019-10-17 DIAGNOSIS — Z8371 Family history of colonic polyps: Secondary | ICD-10-CM

## 2019-10-17 DIAGNOSIS — N926 Irregular menstruation, unspecified: Secondary | ICD-10-CM | POA: Diagnosis not present

## 2019-10-17 DIAGNOSIS — K219 Gastro-esophageal reflux disease without esophagitis: Secondary | ICD-10-CM | POA: Diagnosis not present

## 2019-10-17 DIAGNOSIS — I10 Essential (primary) hypertension: Secondary | ICD-10-CM | POA: Diagnosis not present

## 2019-10-17 DIAGNOSIS — E7800 Pure hypercholesterolemia, unspecified: Secondary | ICD-10-CM | POA: Diagnosis not present

## 2019-10-17 DIAGNOSIS — D649 Anemia, unspecified: Secondary | ICD-10-CM

## 2019-10-17 DIAGNOSIS — E1165 Type 2 diabetes mellitus with hyperglycemia: Secondary | ICD-10-CM

## 2019-10-17 LAB — FOLLICLE STIMULATING HORMONE: FSH: 124.9 m[IU]/mL

## 2019-10-17 NOTE — Progress Notes (Signed)
Patient ID: Anne [name], female   DOB: 1966-05-26, 54 y.o.   MRN: [id number]   Subjective:    Patient ID: Anne [name], female    DOB: May 30, 1966, 54 y.o.   MRN: [id number]  HPI This visit occurred during the SARS-CoV-2 public health emergency.  Safety protocols were in place, including screening questions prior to the visit, additional usage of staff PPE, and extensive cleaning of exam room while observing appropriate contact time as indicated for disinfecting solutions.  Patient here for a scheduled follow up.  She is doing well.  Retired. Off prozac since mid 07/2019.  Working a part time job.  Enjoying this.  Staying active.  Weight is down.  Watching her diet.  Discussed diet and exercise.  Discussed [facility] diet.  No chest pain or sob reported.  No abdominal pain or bowel change reported.  Due colonoscopy this year.  Last 11/28/14.  LMP 04/2019.  Occasional night sweats and hot flashes.  Overall she feels she is doing well.    Past Medical History:  Diagnosis Date  . Anemia   . Diabetes mellitus ([place])   . Gastritis   . History of abnormal Pap smear    s/p LEEP, h/o HPV  . Hypercholesterolemia   . Hypertension   . IBS (irritable bowel syndrome)    Past Surgical History:  Procedure Laterality Date  . APPENDECTOMY    . BREAST BIOPSY Left 08/25/2017   left breast biopsy path pending  . COLONOSCOPY WITH PROPOFOL N/A 11/28/2014   Procedure: COLONOSCOPY WITH PROPOFOL;  Surgeon: Lollie Sails, MD;  Location: [facility] ENDOSCOPY;  Service: Endoscopy;  Laterality: N/A;   Family History  Problem Relation Age of Onset  . Diabetes Paternal Grandmother   . Emphysema Paternal Grandfather   . Breast cancer Sister 2  . Colon cancer Neg Hx    Social History   Socioeconomic History  . Marital status: Single    Spouse name: Not on file  . Number of children: 0  . Years of education: Not on file  . Highest education level: Not on file  Occupational History  . Not on file    Tobacco Use  . Smoking status: Never Smoker  . Smokeless tobacco: Never Used  Substance and Sexual Activity  . Alcohol use: No    Alcohol/week: 0.0 standard drinks  . Drug use: No  . Sexual activity: Not on file  Other Topics Concern  . Not on file  Social History Narrative  . Not on file   Social Determinants of Health   Financial Resource Strain:   . Difficulty of Paying Living Expenses:   Food Insecurity:   . Worried About Charity fundraiser in the Last Year:   . Arboriculturist in the Last Year:   Transportation Needs:   . Film/video editor (Medical):   Marland Kitchen Lack of Transportation (Non-Medical):   Physical Activity:   . Days of Exercise per Week:   . Minutes of Exercise per Session:   Stress:   . Feeling of Stress :   Social Connections:   . Frequency of Communication with Friends and Family:   . Frequency of Social Gatherings with Friends and Family:   . Attends Religious Services:   . Active Member of Clubs or Organizations:   . Attends Archivist Meetings:   Marland Kitchen Marital Status:     Outpatient Encounter Medications as of 10/17/2019  Medication Sig  . atorvastatin (LIPITOR) 10 MG  tablet TAKE 1 TABLET BY MOUTH EVERY DAY  . Blood Glucose Monitoring Suppl (CONTOUR BLOOD GLUCOSE SYSTEM) DEVI Use to test blood sugar up to three times daily - Using [employer]  . fish oil-omega-3 fatty acids 1000 MG capsule Take 1 g by mouth daily.  Marland Kitchen glucose blood (BAYER CONTOUR TEST) test strip Use as instructed to test blood sugar three times daily - Using [employer]  . JARDIANCE 25 MG TABS tablet TAKE 25 MG BY MOUTH DAILY.  Marland Kitchen Lancet Devices (MICROLET NEXT LANCING DEVICE) MISC 1 each by Does not apply route daily.  . metFORMIN (GLUCOPHAGE-XR) 500 MG 24 hr tablet TAKE 2 TABLETS BY MOUTH TWICE A DAY  . MICROLET LANCETS MISC 1 each by Does not apply route 3 (three) times daily.  . Multiple Vitamin (MULTIVITAMIN) tablet Take 1 tablet by mouth daily.  .  pantoprazole (PROTONIX) 40 MG tablet TAKE 1 TABLET BY MOUTH EVERY DAY  . [DISCONTINUED] BIOTIN PO Take 1 capsule by mouth daily.   . [DISCONTINUED] FLUoxetine (PROZAC) 10 MG capsule TAKE 1-2 CAPSULES EACH DAY (Patient not taking: Reported on 10/17/2019)   No facility-administered encounter medications on file as of 10/17/2019.   Review of Systems  Constitutional: Negative for appetite change and unexpected weight change.  HENT: Negative for congestion and sinus pressure.   Respiratory: Negative for cough, chest tightness and shortness of breath.   Cardiovascular: Negative for chest pain, palpitations and leg swelling.  Gastrointestinal: Negative for abdominal pain, diarrhea, nausea and vomiting.  Genitourinary: Negative for difficulty urinating and dysuria.  Musculoskeletal: Negative for joint swelling and myalgias.  Skin: Negative for color change and rash.  Neurological: Negative for dizziness, light-headedness and headaches.  Psychiatric/Behavioral: Negative for agitation and dysphoric mood.       Objective:    Physical Exam Vitals reviewed.  Constitutional:      General: She is not in acute distress.    Appearance: Normal appearance.  HENT:     Head: Normocephalic and atraumatic.     Right Ear: External ear normal.     Left Ear: External ear normal.  Eyes:     General: No scleral icterus.       Right eye: No discharge.        Left eye: No discharge.     Conjunctiva/sclera: Conjunctivae normal.  Neck:     Thyroid: No thyromegaly.  Cardiovascular:     Rate and Rhythm: Normal rate and regular rhythm.  Pulmonary:     Effort: No respiratory distress.     Breath sounds: Normal breath sounds. No wheezing.  Abdominal:     General: Bowel sounds are normal.     Palpations: Abdomen is soft.     Tenderness: There is no abdominal tenderness.  Musculoskeletal:        General: No swelling or tenderness.     Cervical back: Neck supple. No tenderness.  Lymphadenopathy:      Cervical: No cervical adenopathy.  Skin:    Findings: No erythema or rash.  Neurological:     Mental Status: She is alert.  Psychiatric:        Mood and Affect: Mood normal.        Behavior: Behavior normal.     BP 110/84 (BP Location: Left Arm, Patient Position: Sitting)   Pulse 77   Temp (!) 97.1 F (36.2 C)   Ht 5' 2.99" (1.6 m)   Wt 178 lb 6.4 oz (80.9 kg)   SpO2 99%  BMI 31.61 kg/m  Wt Readings from Last 3 Encounters:  10/17/19 178 lb 6.4 oz (80.9 kg)  04/11/19 184 lb 9.6 oz (83.7 kg)  06/22/18 191 lb 12.8 oz (87 kg)     Lab Results  Component Value Date   WBC 5.2 10/17/2018   HGB 13.8 10/17/2018   HCT 41.6 10/17/2018   PLT 227.0 10/17/2018   GLUCOSE 151 (H) 10/14/2019   CHOL 183 10/14/2019   TRIG 227.0 (H) 10/14/2019   HDL 40.80 10/14/2019   LDLDIRECT 103.0 10/14/2019   LDLCALC 69 04/08/2019   ALT 21 10/14/2019   AST 18 10/14/2019   NA 135 10/14/2019   K 4.0 10/14/2019   CL 102 10/14/2019   CREATININE 0.84 10/14/2019   BUN 22 10/14/2019   CO2 26 10/14/2019   TSH 1.24 10/14/2019   HGBA1C 7.0 (H) 10/14/2019   MICROALBUR 1.0 10/14/2019       Assessment & Plan:   Problem List Items Addressed This Visit    Anemia    Follow cbc.        RESOLVED: Diabetes mellitus ([place])    Low carb diet and exercise.  a1c 7.0.  She has adjusted her diet.  Has lost weight.  Hold on making changes in her medication.  Follow met b and a1c.        Relevant Orders   Hemoglobin A1c   Family history of colonic polyps    Colonoscopy 11/28/14.  Recommended f/u in 5 years.  Discussed.        GERD (gastroesophageal reflux disease)    No upper symptoms reported.  On protonix.  Follow.       Hypercholesterolemia    On lipitor.  Has adjusted her diet.  Discussed [facility] diet.  Low carb diet and exercise.  Follow lipid panel and liver function tests.        Relevant Orders   Hepatic function panel   Lipid panel   Hypertension    Blood pressure under good control.   Continue same medication regimen. On no medication.  Follow pressures.  Follow metabolic panel.         Relevant Orders   CBC with Differential/Platelet   Basic metabolic panel   Menstrual abnormality - Primary    LMP as outlined.  Previous periods - 09/2018 and 02/2019.  Continue to keep menstrual diary.  Check FSH.  Follow.        Relevant Orders   FSH (Completed)       Einar Pheasant, MD

## 2019-10-17 NOTE — Telephone Encounter (Signed)
Please send [facility] Lip[facility] diet to pt.

## 2019-10-18 NOTE — Telephone Encounter (Signed)
[facility] lipid diet mailed 

## 2019-10-28 ENCOUNTER — Encounter: Payer: Self-pay | Admitting: Internal Medicine

## 2019-10-28 DIAGNOSIS — E1165 Type 2 diabetes mellitus with hyperglycemia: Secondary | ICD-10-CM | POA: Insufficient documentation

## 2019-10-28 NOTE — Assessment & Plan Note (Signed)
Colonoscopy 11/28/14.  Recommended f/u in 5 years.  Discussed.

## 2019-10-28 NOTE — Assessment & Plan Note (Signed)
Low carb diet and exercise.  a1c 7.0.  She has adjusted her diet.  Lost weight.  Hold on additional medication.  Follow met b and a1c.  

## 2019-10-28 NOTE — Assessment & Plan Note (Signed)
Low carb diet and exercise.  a1c 7.0.  She has adjusted her diet.  Has lost weight.  Hold on making changes in her medication.  Follow met b and a1c.

## 2019-10-28 NOTE — Assessment & Plan Note (Signed)
Blood pressure under good control.  Continue same medication regimen. On no medication.  Follow pressures.  Follow metabolic panel.

## 2019-10-28 NOTE — Assessment & Plan Note (Signed)
LMP as outlined.  Previous periods - 09/2018 and 02/2019.  Continue to keep menstrual diary.  Check FSH.  Follow.

## 2019-10-28 NOTE — Assessment & Plan Note (Signed)
Follow cbc.  

## 2019-10-28 NOTE — Assessment & Plan Note (Signed)
On lipitor.  Has adjusted her diet.  Discussed [facility] diet.  Low carb diet and exercise.  Follow lipid panel and liver function tests.

## 2019-10-28 NOTE — Assessment & Plan Note (Signed)
No upper symptoms reported.  On protonix.  Follow.  

## 2019-11-17 ENCOUNTER — Other Ambulatory Visit: Payer: Self-pay | Admitting: Internal Medicine

## 2019-11-18 ENCOUNTER — Other Ambulatory Visit: Payer: Self-pay | Admitting: Internal Medicine

## 2020-02-14 ENCOUNTER — Encounter: Payer: Self-pay | Admitting: Internal Medicine

## 2020-02-17 ENCOUNTER — Other Ambulatory Visit: Payer: Self-pay

## 2020-02-17 ENCOUNTER — Other Ambulatory Visit: Payer: Self-pay | Admitting: Internal Medicine

## 2020-02-17 ENCOUNTER — Other Ambulatory Visit (INDEPENDENT_AMBULATORY_CARE_PROVIDER_SITE_OTHER): Payer: BC Managed Care – PPO

## 2020-02-17 DIAGNOSIS — E1165 Type 2 diabetes mellitus with hyperglycemia: Secondary | ICD-10-CM | POA: Diagnosis not present

## 2020-02-17 DIAGNOSIS — E7800 Pure hypercholesterolemia, unspecified: Secondary | ICD-10-CM

## 2020-02-17 DIAGNOSIS — Z1231 Encounter for screening mammogram for malignant neoplasm of breast: Secondary | ICD-10-CM

## 2020-02-17 DIAGNOSIS — I10 Essential (primary) hypertension: Secondary | ICD-10-CM | POA: Diagnosis not present

## 2020-02-17 LAB — CBC WITH DIFFERENTIAL/PLATELET
Basophils Absolute: 0 10*3/uL (ref 0.0–0.1)
Basophils Relative: 0.8 % (ref 0.0–3.0)
Eosinophils Absolute: 0.2 10*3/uL (ref 0.0–0.7)
Eosinophils Relative: 4 % (ref 0.0–5.0)
HCT: 41.7 % (ref 36.0–46.0)
Hemoglobin: 13.7 g/dL (ref 12.0–15.0)
Lymphocytes Relative: 35.5 % (ref 12.0–46.0)
Lymphs Abs: 2.2 10*3/uL (ref 0.7–4.0)
MCHC: 32.8 g/dL (ref 30.0–36.0)
MCV: 85 fl (ref 78.0–100.0)
Monocytes Absolute: 0.4 10*3/uL (ref 0.1–1.0)
Monocytes Relative: 7.3 % (ref 3.0–12.0)
Neutro Abs: 3.2 10*3/uL (ref 1.4–7.7)
Neutrophils Relative %: 52.4 % (ref 43.0–77.0)
Platelets: 235 10*3/uL (ref 150.0–400.0)
RBC: 4.9 Mil/uL (ref 3.87–5.11)
RDW: 17 % — ABNORMAL HIGH (ref 11.5–15.5)
WBC: 6.1 10*3/uL (ref 4.0–10.5)

## 2020-02-17 LAB — BASIC METABOLIC PANEL
BUN: 21 mg/dL (ref 6–23)
CO2: 27 mEq/L (ref 19–32)
Calcium: 9.5 mg/dL (ref 8.4–10.5)
Chloride: 104 mEq/L (ref 96–112)
Creatinine, Ser: 0.83 mg/dL (ref 0.40–1.20)
GFR: 72.15 mL/min (ref >60.00)
Glucose, Bld: 138 mg/dL — ABNORMAL HIGH (ref 70–99)
Potassium: 4.1 mEq/L (ref 3.5–5.1)
Sodium: 140 mEq/L (ref 135–145)

## 2020-02-17 LAB — HEPATIC FUNCTION PANEL
ALT: 19 U/L (ref 0–35)
AST: 15 U/L (ref 0–37)
Albumin: 4.2 g/dL (ref 3.5–5.2)
Alkaline Phosphatase: 68 U/L (ref 39–117)
Bilirubin, Direct: 0.1 mg/dL (ref 0.0–0.3)
Total Bilirubin: 0.3 mg/dL (ref 0.2–1.2)
Total Protein: 7.1 g/dL (ref 6.0–8.3)

## 2020-02-17 LAB — LIPID PANEL
Cholesterol: 168 mg/dL (ref 0–200)
HDL: 49.2 mg/dL (ref >39.00)
NonHDL: 119.24
Total CHOL/HDL Ratio: 3
Triglycerides: 245 mg/dL — ABNORMAL HIGH (ref 0.0–149.0)
VLDL: 49 mg/dL — ABNORMAL HIGH (ref 0.0–40.0)

## 2020-02-17 LAB — LDL CHOLESTEROL, DIRECT: Direct LDL: 90 mg/dL

## 2020-02-17 LAB — HEMOGLOBIN A1C: Hgb A1c MFr Bld: 6.9 % — ABNORMAL HIGH (ref 4.6–6.5)

## 2020-02-17 NOTE — Telephone Encounter (Signed)
I do not mind placing order for referral, but please clarify with [name] why the referral is needed - diagnosis/concern.  I will give her her grandmother's name so the information can be placed in the correct chart and not in [facility] chart.

## 2020-02-20 ENCOUNTER — Encounter: Payer: Self-pay | Admitting: Internal Medicine

## 2020-02-20 ENCOUNTER — Telehealth (INDEPENDENT_AMBULATORY_CARE_PROVIDER_SITE_OTHER): Payer: BC Managed Care – PPO | Admitting: Internal Medicine

## 2020-02-20 DIAGNOSIS — Z862 Personal history of diseases of the blood and blood-forming organs and certain disorders involving the immune mechanism: Secondary | ICD-10-CM

## 2020-02-20 DIAGNOSIS — E7800 Pure hypercholesterolemia, unspecified: Secondary | ICD-10-CM

## 2020-02-20 DIAGNOSIS — K219 Gastro-esophageal reflux disease without esophagitis: Secondary | ICD-10-CM | POA: Diagnosis not present

## 2020-02-20 DIAGNOSIS — E1165 Type 2 diabetes mellitus with hyperglycemia: Secondary | ICD-10-CM | POA: Diagnosis not present

## 2020-02-20 DIAGNOSIS — R232 Flushing: Secondary | ICD-10-CM

## 2020-02-20 MED ORDER — FLUTICASONE PROPIONATE 50 MCG/ACT NA SUSP: 2.0000 | Freq: Every day | NASAL | 6 refills | Status: DC

## 2020-02-20 NOTE — Telephone Encounter (Signed)
Called and spoke with Zanetta and gained more information and the diagnosis/concern for the neurological evaluation for [employer]. It has been placed in [facility] chart and routed to PCP.   During the call, Anayia verbalized that she has an appointment today with Dr. Lorin Picket and requests that a number of health concerns be removed, such as obesity, stress, etc. She asks if it is possible to remove them from the chart and would like to "Clean up" her problem list during her visit today with Dr. Lorin Picket.

## 2020-02-20 NOTE — Telephone Encounter (Signed)
D/w pt at appt.

## 2020-02-20 NOTE — Progress Notes (Signed)
Patient ID: Anne [name], female   DOB: Apr 08, 1966, 54 y.o.   MRN: [id number]   Virtual Visit via video Note  This visit type was conducted due to national recommendations for restrictions regarding the COVID-19 pandemic (e.g. social distancing).  This format is felt to be most appropriate for this patient at this time.  All issues noted in this document were discussed and addressed.  No physical exam was performed (except for noted visual exam findings with Video Visits).   I connected with Christa See by a video enabled telemedicine application  and verified that I am speaking with the correct person using two identifiers. Location patient: home Location provider: work  Persons participating in the virtual visit: patient, provider  The limitations, risks, security and privacy concerns of performing an evaluation and management service by video and the availability of in person appointments have been discussed.  It has also been discussed with the patient that there may be a patient responsible charge related to this service. The patient expressed understanding and agreed to proceed.   Reason for visit: scheduled follow up.    HPI: Here for a scheduled follow up.  F/u regarding her sugar, cholesterol and blood pressure.  Has part time job.  Joined a gym.  Swimming.  Eating better.  Has lost weight.  Feels better.  Handling stress.  Does reports some hot flashes. Discussed treatment options. No chest pain or sob reported.  No abdominal pain or bowel change reported.  Colonoscopy scheduled.  Taking iron.  handling stress.      ROS: See pertinent positives and negatives per HPI.  Past Medical History:  Diagnosis Date  . Anemia   . Diabetes mellitus ([place])   . Gastritis   . History of abnormal Pap smear    s/p LEEP, h/o HPV  . Hypercholesterolemia   . Hypertension   . IBS (irritable bowel syndrome)     Past Surgical History:  Procedure Laterality Date  . APPENDECTOMY    .  BREAST BIOPSY Left 08/25/2017   left breast biopsy path pending  . COLONOSCOPY WITH PROPOFOL N/A 11/28/2014   Procedure: COLONOSCOPY WITH PROPOFOL;  Surgeon: Lollie Sails, MD;  Location: [facility] ENDOSCOPY;  Service: Endoscopy;  Laterality: N/A;    Family History  Problem Relation Age of Onset  . Diabetes Paternal Grandmother   . Emphysema Paternal Grandfather   . Breast cancer Sister 43  . Colon cancer Neg Hx     SOCIAL HX: reviewed.    Current Outpatient Medications:  .  atorvastatin (LIPITOR) 10 MG tablet, TAKE 1 TABLET BY MOUTH EVERY DAY, Disp: 90 tablet, Rfl: 2 .  Blood Glucose Monitoring Suppl (CONTOUR BLOOD GLUCOSE SYSTEM) DEVI, Use to test blood sugar up to three times daily - Using [employer] System, Disp: 1 Device, Rfl: 0 .  fish oil-omega-3 fatty acids 1000 MG capsule, Take 1 g by mouth daily., Disp: , Rfl:  .  glucose blood (BAYER CONTOUR TEST) test strip, Use as instructed to test blood sugar three times daily - Using [employer] System, Disp: 100 each, Rfl: 12 .  JARDIANCE 25 MG TABS tablet, TAKE 25 MG BY MOUTH DAILY., Disp: 90 tablet, Rfl: 1 .  Lancet Devices (MICROLET NEXT LANCING DEVICE) MISC, 1 each by Does not apply route daily., Disp: 1 each, Rfl: 0 .  metFORMIN (GLUCOPHAGE-XR) 500 MG 24 hr tablet, TAKE 2 TABLETS BY MOUTH TWICE A DAY, Disp: 360 tablet, Rfl: 1 .  MICROLET LANCETS MISC, 1  each by Does not apply route 3 (three) times daily., Disp: 100 each, Rfl: 11 .  Multiple Vitamin (MULTIVITAMIN) tablet, Take 1 tablet by mouth daily., Disp: , Rfl:  .  Na Sulfate-K Sulfate-Mg Sulf 17.5-3.13-1.6 GM/177ML SOLN, Take by mouth., Disp: , Rfl:  .  pantoprazole (PROTONIX) 40 MG tablet, TAKE 1 TABLET BY MOUTH EVERY DAY, Disp: 90 tablet, Rfl: 2 .  fluticasone (FLONASE) 50 MCG/ACT nasal spray, Place 2 sprays into both nostrils daily., Disp: 16 g, Rfl: 6  EXAM:  VITALS per patient if applicable: 867/61  GENERAL: alert, oriented, appears well and in no acute  distress  HEENT: atraumatic, conjunttiva clear, no obvious abnormalities on inspection of external nose and ears  NECK: normal movements of the head and neck  LUNGS: on inspection no signs of respiratory distress, breathing rate appears normal, no obvious gross SOB, gasping or wheezing  CV: no obvious cyanosis  PSYCH/NEURO: pleasant and cooperative, no obvious depression or anxiety, speech and thought processing grossly intact  ASSESSMENT AND PLAN:  Discussed the following assessment and plan:  Type 2 diabetes mellitus with hyperglycemia (HCC) Continue low carb diet and exercise.  On metfromin and jardiance.  a1c 6.9.  Follow met b and a1c.    Hypercholesterolemia On lipitor.  Low cholesterol diet and exercise.  Follow lipid panel and liver function tests.   Lab Results  Component Value Date   CHOL 168 02/17/2020   HDL 49.20 02/17/2020   LDLCALC 69 04/08/2019   LDLDIRECT 90.0 02/17/2020   TRIG 245.0 (H) 02/17/2020   CHOLHDL 3 02/17/2020    History of anemia Colonoscopy 10/2016 - normal.  Follow cbc. Check iron stores with next labs.    GERD (gastroesophageal reflux disease) No upper symptoms reported. On protonix.    Hot flashes Discussed treatment options.  Trial of vitamin E   Orders Placed This Encounter  Procedures  . CBC with Differential/Platelet    Standing Status:   Future    Standing Expiration Date:   02/28/2021  . Hemoglobin A1c    Standing Status:   Future    Standing Expiration Date:   02/28/2021  . Hepatic function panel    Standing Status:   Future    Standing Expiration Date:   02/28/2021  . Lipid panel    Standing Status:   Future    Standing Expiration Date:   02/28/2021  . Basic metabolic panel    Standing Status:   Future    Standing Expiration Date:   02/28/2021  . IBC + Ferritin    Standing Status:   Future    Standing Expiration Date:   02/28/2021    Meds ordered this encounter  Medications  . fluticasone (FLONASE) 50 MCG/ACT nasal  spray    Sig: Place 2 sprays into both nostrils daily.    Dispense:  16 g    Refill:  6     I discussed the assessment and treatment plan with the patient. The patient was provided an opportunity to ask questions and all were answered. The patient agreed with the plan and demonstrated an understanding of the instructions.   The patient was advised to call back or seek an in-person evaluation if the symptoms worsen or if the condition fails to improve as anticipated.   Einar Pheasant, MD

## 2020-02-29 ENCOUNTER — Encounter: Payer: Self-pay | Admitting: Internal Medicine

## 2020-02-29 DIAGNOSIS — R232 Flushing: Secondary | ICD-10-CM | POA: Insufficient documentation

## 2020-02-29 NOTE — Assessment & Plan Note (Signed)
On lipitor.  Low cholesterol diet and exercise.  Follow lipid panel and liver function tests.   Lab Results  Component Value Date   CHOL 168 02/17/2020   HDL 49.20 02/17/2020   LDLCALC 69 04/08/2019   LDLDIRECT 90.0 02/17/2020   TRIG 245.0 (H) 02/17/2020   CHOLHDL 3 02/17/2020

## 2020-02-29 NOTE — Assessment & Plan Note (Signed)
Continue low carb diet and exercise.  On metfromin and jardiance.  a1c 6.9.  Follow met b and a1c.

## 2020-02-29 NOTE — Assessment & Plan Note (Signed)
No upper symptoms reported.  On protonix.   

## 2020-02-29 NOTE — Assessment & Plan Note (Signed)
Discussed treatment options.  Trial of vitamin E

## 2020-02-29 NOTE — Assessment & Plan Note (Addendum)
Colonoscopy 10/2016 - normal.  Follow cbc. Check iron stores with next labs.

## 2020-03-17 ENCOUNTER — Encounter: Payer: Self-pay | Admitting: Internal Medicine

## 2020-03-17 MED ORDER — VENLAFAXINE HCL ER 37.5 MG PO CP24: 37.5000 mg | ORAL_CAPSULE | Freq: Every day | ORAL | 2 refills | Status: DC

## 2020-03-17 NOTE — Telephone Encounter (Signed)
rx sent in for effexor #30 wit 2 refills.

## 2020-03-19 LAB — HM COLONOSCOPY

## 2020-04-09 ENCOUNTER — Ambulatory Visit
Admission: RE | Admit: 2020-04-09 | Discharge: 2020-04-09 | Disposition: A | Discharge status: Home / Self Care | Payer: BC Managed Care – PPO | Source: Ambulatory Visit | Attending: Internal Medicine | Admitting: Internal Medicine

## 2020-04-09 ENCOUNTER — Other Ambulatory Visit: Payer: Self-pay

## 2020-04-09 DIAGNOSIS — Z1231 Encounter for screening mammogram for malignant neoplasm of breast: Secondary | ICD-10-CM | POA: Insufficient documentation

## 2020-04-10 ENCOUNTER — Other Ambulatory Visit: Payer: Self-pay | Admitting: Internal Medicine

## 2020-04-10 ENCOUNTER — Telehealth: Payer: Self-pay | Admitting: Internal Medicine

## 2020-04-10 NOTE — Telephone Encounter (Signed)
Patient called in wanted to know if Dr.Scott could put this medication the 90 day venlafaxine XR (EFFEXOR XR) 37.5 MG 24 hr capsule

## 2020-04-11 ENCOUNTER — Other Ambulatory Visit: Payer: Self-pay | Admitting: Internal Medicine

## 2020-04-11 DIAGNOSIS — R928 Other abnormal and inconclusive findings on diagnostic imaging of breast: Secondary | ICD-10-CM

## 2020-04-11 NOTE — Progress Notes (Signed)
Order placed for f/u left breast mammogram and ultrasound.   

## 2020-04-13 NOTE — Telephone Encounter (Signed)
Rx sent in on 11/5 for 90 day

## 2020-04-21 ENCOUNTER — Ambulatory Visit
Admission: RE | Admit: 2020-04-21 | Discharge: 2020-04-21 | Disposition: A | Discharge status: Home / Self Care | Payer: BC Managed Care – PPO | Source: Ambulatory Visit | Attending: Internal Medicine | Admitting: Internal Medicine

## 2020-04-21 ENCOUNTER — Other Ambulatory Visit: Payer: Self-pay

## 2020-04-21 DIAGNOSIS — R928 Other abnormal and inconclusive findings on diagnostic imaging of breast: Secondary | ICD-10-CM

## 2020-05-03 ENCOUNTER — Other Ambulatory Visit: Payer: Self-pay | Admitting: Internal Medicine

## 2020-06-19 ENCOUNTER — Encounter: Payer: Self-pay | Admitting: Internal Medicine

## 2020-06-22 MED ORDER — NYSTATIN 100000 UNIT/GM EX CREA: 1.0000 "application " | TOPICAL_CREAM | Freq: Two times a day (BID) | CUTANEOUS | 0 refills | Status: DC

## 2020-06-22 NOTE — Addendum Note (Signed)
Addended by: Charm Barges on: 06/22/2020 07:35 PM   Modules accepted: Orders

## 2020-06-22 NOTE — Telephone Encounter (Signed)
rx sent in for nystatin cream  

## 2020-07-04 ENCOUNTER — Encounter: Payer: Self-pay | Admitting: Internal Medicine

## 2020-07-05 ENCOUNTER — Encounter: Payer: Self-pay | Admitting: Internal Medicine

## 2020-07-06 ENCOUNTER — Other Ambulatory Visit: Payer: Self-pay

## 2020-07-06 MED ORDER — GLUCOSE BLOOD VI STRP: ORAL_STRIP | 12 refills | Status: DC

## 2020-07-07 ENCOUNTER — Other Ambulatory Visit: Payer: Self-pay

## 2020-07-07 ENCOUNTER — Other Ambulatory Visit (INDEPENDENT_AMBULATORY_CARE_PROVIDER_SITE_OTHER): Payer: BC Managed Care – PPO

## 2020-07-07 DIAGNOSIS — E7800 Pure hypercholesterolemia, unspecified: Secondary | ICD-10-CM | POA: Diagnosis not present

## 2020-07-07 DIAGNOSIS — E1165 Type 2 diabetes mellitus with hyperglycemia: Secondary | ICD-10-CM

## 2020-07-07 DIAGNOSIS — Z862 Personal history of diseases of the blood and blood-forming organs and certain disorders involving the immune mechanism: Secondary | ICD-10-CM

## 2020-07-07 LAB — CBC WITH DIFFERENTIAL/PLATELET
Basophils Absolute: 0 10*3/uL (ref 0.0–0.1)
Basophils Relative: 0.6 % (ref 0.0–3.0)
Eosinophils Absolute: 0.2 10*3/uL (ref 0.0–0.7)
Eosinophils Relative: 3.2 % (ref 0.0–5.0)
HCT: 40.9 % (ref 36.0–46.0)
Hemoglobin: 13.5 g/dL (ref 12.0–15.0)
Lymphocytes Relative: 40.6 % (ref 12.0–46.0)
Lymphs Abs: 2.1 10*3/uL (ref 0.7–4.0)
MCHC: 33.1 g/dL (ref 30.0–36.0)
MCV: 87 fl (ref 78.0–100.0)
Monocytes Absolute: 0.4 10*3/uL (ref 0.1–1.0)
Monocytes Relative: 6.9 % (ref 3.0–12.0)
Neutro Abs: 2.5 10*3/uL (ref 1.4–7.7)
Neutrophils Relative %: 48.7 % (ref 43.0–77.0)
Platelets: 222 10*3/uL (ref 150.0–400.0)
RBC: 4.7 Mil/uL (ref 3.87–5.11)
RDW: 15.5 % (ref 11.5–15.5)
WBC: 5.1 10*3/uL (ref 4.0–10.5)

## 2020-07-07 LAB — LIPID PANEL
Cholesterol: 147 mg/dL (ref 0–200)
HDL: 49.8 mg/dL (ref >39.00)
NonHDL: 97.31
Total CHOL/HDL Ratio: 3
Triglycerides: 203 mg/dL — ABNORMAL HIGH (ref 0.0–149.0)
VLDL: 40.6 mg/dL — ABNORMAL HIGH (ref 0.0–40.0)

## 2020-07-07 LAB — IBC + FERRITIN
Ferritin: 5.7 ng/mL — ABNORMAL LOW (ref 10.0–291.0)
Iron: 63 ug/dL (ref 42–145)
Saturation Ratios: 12.2 % — ABNORMAL LOW (ref 20.0–50.0)
Transferrin: 369 mg/dL — ABNORMAL HIGH (ref 212.0–360.0)

## 2020-07-07 LAB — HEPATIC FUNCTION PANEL
ALT: 20 U/L (ref 0–35)
AST: 16 U/L (ref 0–37)
Albumin: 4.1 g/dL (ref 3.5–5.2)
Alkaline Phosphatase: 83 U/L (ref 39–117)
Bilirubin, Direct: 0.1 mg/dL (ref 0.0–0.3)
Total Bilirubin: 0.5 mg/dL (ref 0.2–1.2)
Total Protein: 6.7 g/dL (ref 6.0–8.3)

## 2020-07-07 LAB — BASIC METABOLIC PANEL
BUN: 15 mg/dL (ref 6–23)
CO2: 28 mEq/L (ref 19–32)
Calcium: 9.4 mg/dL (ref 8.4–10.5)
Chloride: 99 mEq/L (ref 96–112)
Creatinine, Ser: 0.7 mg/dL (ref 0.40–1.20)
GFR: 99.37 mL/min (ref >60.00)
Glucose, Bld: 133 mg/dL — ABNORMAL HIGH (ref 70–99)
Potassium: 3.9 mEq/L (ref 3.5–5.1)
Sodium: 149 mEq/L — ABNORMAL HIGH (ref 135–145)

## 2020-07-07 LAB — LDL CHOLESTEROL, DIRECT: Direct LDL: 73 mg/dL

## 2020-07-07 LAB — HEMOGLOBIN A1C: Hgb A1c MFr Bld: 7 % — ABNORMAL HIGH (ref 4.6–6.5)

## 2020-07-10 ENCOUNTER — Encounter: Payer: Self-pay | Admitting: Internal Medicine

## 2020-07-10 ENCOUNTER — Other Ambulatory Visit: Payer: Self-pay

## 2020-07-10 ENCOUNTER — Ambulatory Visit (INDEPENDENT_AMBULATORY_CARE_PROVIDER_SITE_OTHER): Payer: BC Managed Care – PPO | Admitting: Internal Medicine

## 2020-07-10 DIAGNOSIS — E7800 Pure hypercholesterolemia, unspecified: Secondary | ICD-10-CM | POA: Diagnosis not present

## 2020-07-10 DIAGNOSIS — E1165 Type 2 diabetes mellitus with hyperglycemia: Secondary | ICD-10-CM

## 2020-07-10 DIAGNOSIS — I10 Essential (primary) hypertension: Secondary | ICD-10-CM | POA: Diagnosis not present

## 2020-07-10 DIAGNOSIS — Z862 Personal history of diseases of the blood and blood-forming organs and certain disorders involving the immune mechanism: Secondary | ICD-10-CM | POA: Diagnosis not present

## 2020-07-10 DIAGNOSIS — K219 Gastro-esophageal reflux disease without esophagitis: Secondary | ICD-10-CM

## 2020-07-10 DIAGNOSIS — N898 Other specified noninflammatory disorders of vagina: Secondary | ICD-10-CM

## 2020-07-10 LAB — HM DIABETES FOOT EXAM

## 2020-07-10 MED ORDER — LOSARTAN POTASSIUM 25 MG PO TABS: 25.0000 mg | ORAL_TABLET | Freq: Every day | ORAL | 2 refills | Status: DC

## 2020-07-10 MED ORDER — ACYCLOVIR 400 MG PO TABS: ORAL_TABLET | ORAL | 1 refills | Status: DC

## 2020-07-10 NOTE — Progress Notes (Signed)
Patient ID: Anne [name], female   DOB: May 10, 1966, 55 y.o.   MRN: [id number]   Subjective:    Patient ID: Anne [name], female    DOB: 09-Oct-1965, 55 y.o.   MRN: [id number]  HPI This visit occurred during the SARS-CoV-2 public health emergency.  Safety protocols were in place, including screening questions prior to the visit, additional usage of staff PPE, and extensive cleaning of exam room while observing appropriate contact time as indicated for disinfecting solutions.  Patient here for a scheduled follow up. She  Is here to follow up regarding her blood sugar, blood pressure and cholesterol.  She tries to stay active.  No chest pain or sob reported.  No acid reflux or abdominal pain reported.  Bowels stable.  Reviewed labs.  a1c 7.0.  Discussed medication.  Prefers to continue diet and exercise before adding more medication.  Blood pressure remaining elevated.  Discussed medication treatment.  Discussed vaginal flares.  Discussed treatment.    Past Medical History:  Diagnosis Date  . Anemia   . Diabetes mellitus ([place])   . Gastritis   . History of abnormal Pap smear    s/p LEEP, h/o HPV  . Hypercholesterolemia   . Hypertension   . IBS (irritable bowel syndrome)    Past Surgical History:  Procedure Laterality Date  . APPENDECTOMY    . BREAST BIOPSY Left 08/25/2017   left breast biopsy neg  . COLONOSCOPY WITH PROPOFOL N/A 11/28/2014   Procedure: COLONOSCOPY WITH PROPOFOL;  Surgeon: Lollie Sails, MD;  Location: [facility] ENDOSCOPY;  Service: Endoscopy;  Laterality: N/A;   Family History  Problem Relation Age of Onset  . Diabetes Paternal Grandmother   . Emphysema Paternal Grandfather   . Breast cancer Sister 62  . Colon cancer Neg Hx    Social History   Socioeconomic History  . Marital status: Single    Spouse name: Not on file  . Number of children: 0  . Years of education: Not on file  . Highest education level: Not on file  Occupational History  . Not on  file  Tobacco Use  . Smoking status: Never Smoker  . Smokeless tobacco: Never Used  Substance and Sexual Activity  . Alcohol use: No    Alcohol/week: 0.0 standard drinks  . Drug use: No  . Sexual activity: Not on file  Other Topics Concern  . Not on file  Social History Narrative  . Not on file   Social Determinants of Health   Financial Resource Strain: Not on file  Food Insecurity: Not on file  Transportation Needs: Not on file  Physical Activity: Not on file  Stress: Not on file  Social Connections: Not on file    Outpatient Encounter Medications as of 07/10/2020  Medication Sig  . acyclovir (ZOVIRAX) 400 MG tablet One tablet tid for 5 days.  Marland Kitchen losartan (COZAAR) 25 MG tablet Take 1 tablet (25 mg total) by mouth daily.  Marland Kitchen atorvastatin (LIPITOR) 10 MG tablet TAKE 1 TABLET BY MOUTH EVERY DAY  . Blood Glucose Monitoring Suppl (CONTOUR BLOOD GLUCOSE SYSTEM) DEVI Use to test blood sugar up to three times daily - Using [employer]  . fish oil-omega-3 fatty acids 1000 MG capsule Take 1 g by mouth daily.  . fluticasone (FLONASE) 50 MCG/ACT nasal spray Place 2 sprays into both nostrils daily.  Marland Kitchen glucose blood (BAYER CONTOUR TEST) test strip Use as instructed to test blood sugar three times daily - Using [employer]  System  . JARDIANCE 25 MG TABS tablet TAKE 25 MG BY MOUTH DAILY.  Marland Kitchen Lancet Devices (MICROLET NEXT LANCING DEVICE) MISC 1 each by Does not apply route daily.  . metFORMIN (GLUCOPHAGE-XR) 500 MG 24 hr tablet TAKE 2 TABLETS BY MOUTH TWICE A DAY  . MICROLET LANCETS MISC 1 each by Does not apply route 3 (three) times daily.  . Multiple Vitamin (MULTIVITAMIN) tablet Take 1 tablet by mouth daily.  Marland Kitchen nystatin cream (MYCOSTATIN) Apply 1 application topically 2 (two) times daily.  . pantoprazole (PROTONIX) 40 MG tablet TAKE 1 TABLET BY MOUTH EVERY DAY  . venlafaxine XR (EFFEXOR-XR) 37.5 MG 24 hr capsule TAKE 1 CAPSULE BY MOUTH DAILY WITH BREAKFAST.  . [DISCONTINUED] Na  Sulfate-K Sulfate-Mg Sulf 17.5-3.13-1.6 GM/177ML SOLN Take by mouth.   No facility-administered encounter medications on file as of 07/10/2020.    Review of Systems  Constitutional: Negative for appetite change and unexpected weight change.  HENT: Negative for congestion and sinus pressure.   Respiratory: Negative for cough, chest tightness and shortness of breath.   Cardiovascular: Negative for chest pain, palpitations and leg swelling.  Gastrointestinal: Negative for abdominal pain, diarrhea, nausea and vomiting.  Genitourinary: Negative for difficulty urinating and dysuria.  Musculoskeletal: Negative for joint swelling and myalgias.  Skin: Negative for color change and rash.  Neurological: Negative for dizziness, light-headedness and headaches.  Psychiatric/Behavioral: Negative for agitation and dysphoric mood.       Objective:    Physical Exam Vitals reviewed.  Constitutional:      General: She is not in acute distress.    Appearance: Normal appearance.  HENT:     Head: Normocephalic and atraumatic.     Right Ear: External ear normal.     Left Ear: External ear normal.     Mouth/Throat:     Mouth: Oropharynx is clear and moist.  Eyes:     General: No scleral icterus.       Right eye: No discharge.        Left eye: No discharge.     Conjunctiva/sclera: Conjunctivae normal.  Neck:     Thyroid: No thyromegaly.  Cardiovascular:     Rate and Rhythm: Normal rate and regular rhythm.  Pulmonary:     Effort: No respiratory distress.     Breath sounds: Normal breath sounds. No wheezing.  Abdominal:     General: Bowel sounds are normal.     Palpations: Abdomen is soft.     Tenderness: There is no abdominal tenderness.  Musculoskeletal:        General: No swelling, tenderness or edema.     Cervical back: Neck supple. No tenderness.  Lymphadenopathy:     Cervical: No cervical adenopathy.  Skin:    Findings: No erythema or rash.  Neurological:     Mental Status: She is  alert.  Psychiatric:        Mood and Affect: Mood normal.        Behavior: Behavior normal.     BP 122/76   Pulse 76   Temp 98.4 F (36.9 C) (Oral)   Resp 16   Ht _0  (1.6 m)   Wt 173 lb 12.8 oz (78.8 kg)   LMP 03/14/2019 (Approximate)   SpO2 98%   BMI 30.79 kg/m  Wt Readings from Last 3 Encounters:  07/10/20 173 lb 12.8 oz (78.8 kg)  02/20/20 168 lb (76.2 kg)  10/17/19 178 lb 6.4 oz (80.9 kg)     Lab Results  Component Value Date   WBC 5.1 07/07/2020   HGB 13.5 07/07/2020   HCT 40.9 07/07/2020   PLT 222.0 07/07/2020   GLUCOSE 133 (H) 07/07/2020   CHOL 147 07/07/2020   TRIG 203.0 (H) 07/07/2020   HDL 49.80 07/07/2020   LDLDIRECT 73.0 07/07/2020   LDLCALC 69 04/08/2019   ALT 20 07/07/2020   AST 16 07/07/2020   NA 149 (H) 07/07/2020   K 3.9 07/07/2020   CL 99 07/07/2020   CREATININE 0.70 07/07/2020   BUN 15 07/07/2020   CO2 28 07/07/2020   TSH 1.24 10/14/2019   HGBA1C 7.0 (H) 07/07/2020   MICROALBUR 1.0 10/14/2019    MM DIAG BREAST TOMO UNI LEFT  Result Date: 04/21/2020 CLINICAL DATA:  Screening recall for a possible left breast distortion. EXAM: DIGITAL DIAGNOSTIC UNILATERAL LEFT MAMMOGRAM WITH TOMO AND CAD COMPARISON:  Previous exam(s). ACR Breast Density Category c: The breast tissue is heterogeneously dense, which may obscure small masses. FINDINGS: The questioned distortion in the lateral left breast resolves on spot compression tomosynthesis imaging. No correlate for distortion is seen on the lateral view. Mammographic images were processed with CAD. IMPRESSION: Resolution of the questioned left breast distortion consistent with overlapping fibroglandular tissue. RECOMMENDATION: Screening mammogram in one year.(Code:SM-B-01Y) I have discussed the findings and recommendations with the patient. If applicable, a reminder letter will be sent to the patient regarding the next appointment. BI-RADS CATEGORY  1: Negative. Electronically Signed   By: Ammie Ferrier M.D.   On: 04/21/2020 15:06       Assessment & Plan:   Problem List Items Addressed This Visit    GERD (gastroesophageal reflux disease)    Upper symptoms controlled. On protonix.        History of anemia    Follow cbc and iron. Restart iron qod.       Hypercholesterolemia    On lipitor.  Low cholesterol diet and exercise.  Follow lipid panel and liver function tests.   Lab Results  Component Value Date   CHOL 147 07/07/2020   HDL 49.80 07/07/2020   LDLCALC 69 04/08/2019   LDLDIRECT 73.0 07/07/2020   TRIG 203.0 (H) 07/07/2020   CHOLHDL 3 07/07/2020        Relevant Medications   losartan (COZAAR) 25 MG tablet   Hypertension    Blood pressure remains elevated.  Start losartan 44m q day.  Follow pressures.  Follow metabolic panel.  Check met b in 10-14 days.        Relevant Medications   losartan (COZAAR) 25 MG tablet   Other Relevant Orders   Basic metabolic panel   Type 2 diabetes mellitus with hyperglycemia ([place]    Continues on metformin and jardiance.  Discussed recent labs.  a1c 7.0.  Discussed diet and exercise.  She prefers to continue diet and exercise instead of adding more medication.  Follow met b and a1c.       Relevant Medications   losartan (COZAAR) 25 MG tablet   Vaginal irritation    rx given for acyclovir to have if needed.            CEinar Pheasant MD

## 2020-07-11 ENCOUNTER — Encounter: Payer: Self-pay | Admitting: Internal Medicine

## 2020-07-11 NOTE — Assessment & Plan Note (Signed)
Follow cbc and iron. Restart iron qod.

## 2020-07-11 NOTE — Assessment & Plan Note (Signed)
rx given for acyclovir to have if needed.

## 2020-07-11 NOTE — Assessment & Plan Note (Signed)
Upper symptoms controlled.  On protonix.  

## 2020-07-11 NOTE — Assessment & Plan Note (Signed)
Continues on metformin and jardiance.  Discussed recent labs.  a1c 7.0.  Discussed diet and exercise.  She prefers to continue diet and exercise instead of adding more medication.  Follow met b and a1c.

## 2020-07-11 NOTE — Assessment & Plan Note (Signed)
Blood pressure remains elevated.  Start losartan 51m q day.  Follow pressures.  Follow metabolic panel.  Check met b in 10-14 days.

## 2020-07-11 NOTE — Assessment & Plan Note (Signed)
On lipitor.  Low cholesterol diet and exercise.  Follow lipid panel and liver function tests.   Lab Results  Component Value Date   CHOL 147 07/07/2020   HDL 49.80 07/07/2020   LDLCALC 69 04/08/2019   LDLDIRECT 73.0 07/07/2020   TRIG 203.0 (H) 07/07/2020   CHOLHDL 3 07/07/2020

## 2020-07-21 ENCOUNTER — Other Ambulatory Visit (INDEPENDENT_AMBULATORY_CARE_PROVIDER_SITE_OTHER): Payer: BC Managed Care – PPO

## 2020-07-21 ENCOUNTER — Other Ambulatory Visit: Payer: Self-pay

## 2020-07-21 DIAGNOSIS — I10 Essential (primary) hypertension: Secondary | ICD-10-CM

## 2020-07-21 LAB — BASIC METABOLIC PANEL
BUN: 20 mg/dL (ref 6–23)
CO2: 28 mEq/L (ref 19–32)
Calcium: 9.4 mg/dL (ref 8.4–10.5)
Chloride: 103 mEq/L (ref 96–112)
Creatinine, Ser: 0.77 mg/dL (ref 0.40–1.20)
GFR: 88.6 mL/min (ref >60.00)
Glucose, Bld: 151 mg/dL — ABNORMAL HIGH (ref 70–99)
Potassium: 4.2 mEq/L (ref 3.5–5.1)
Sodium: 138 mEq/L (ref 135–145)

## 2020-07-27 ENCOUNTER — Other Ambulatory Visit: Payer: Self-pay | Admitting: Internal Medicine

## 2020-07-29 ENCOUNTER — Other Ambulatory Visit: Payer: Self-pay | Admitting: Internal Medicine

## 2020-07-29 DIAGNOSIS — E119 Type 2 diabetes mellitus without complications: Secondary | ICD-10-CM

## 2020-08-08 ENCOUNTER — Other Ambulatory Visit: Payer: Self-pay | Admitting: Internal Medicine

## 2020-10-02 ENCOUNTER — Telehealth: Payer: Self-pay | Admitting: Internal Medicine

## 2020-10-02 DIAGNOSIS — E1165 Type 2 diabetes mellitus with hyperglycemia: Secondary | ICD-10-CM

## 2020-10-02 DIAGNOSIS — Z862 Personal history of diseases of the blood and blood-forming organs and certain disorders involving the immune mechanism: Secondary | ICD-10-CM

## 2020-10-02 DIAGNOSIS — E7800 Pure hypercholesterolemia, unspecified: Secondary | ICD-10-CM

## 2020-10-02 DIAGNOSIS — I10 Essential (primary) hypertension: Secondary | ICD-10-CM

## 2020-10-02 NOTE — Telephone Encounter (Signed)
Please order labs, patient has been scheduled for labs on 5/3

## 2020-10-02 NOTE — Telephone Encounter (Signed)
Orders placed for labs

## 2020-10-02 NOTE — Telephone Encounter (Signed)
She just had labs on 2/1. Do you want to do all routine fasting labs? I do not mind ordering, just wanted to confirm what to order.

## 2020-10-06 ENCOUNTER — Other Ambulatory Visit (INDEPENDENT_AMBULATORY_CARE_PROVIDER_SITE_OTHER): Payer: BC Managed Care – PPO

## 2020-10-06 ENCOUNTER — Other Ambulatory Visit: Payer: Self-pay

## 2020-10-06 DIAGNOSIS — Z862 Personal history of diseases of the blood and blood-forming organs and certain disorders involving the immune mechanism: Secondary | ICD-10-CM | POA: Diagnosis not present

## 2020-10-06 DIAGNOSIS — E7800 Pure hypercholesterolemia, unspecified: Secondary | ICD-10-CM

## 2020-10-06 DIAGNOSIS — I10 Essential (primary) hypertension: Secondary | ICD-10-CM

## 2020-10-06 DIAGNOSIS — E1165 Type 2 diabetes mellitus with hyperglycemia: Secondary | ICD-10-CM | POA: Diagnosis not present

## 2020-10-06 LAB — CBC WITH DIFFERENTIAL/PLATELET
Basophils Absolute: 0 10*3/uL (ref 0.0–0.1)
Basophils Relative: 0.8 % (ref 0.0–3.0)
Eosinophils Absolute: 0.1 10*3/uL (ref 0.0–0.7)
Eosinophils Relative: 3.2 % (ref 0.0–5.0)
HCT: 42.2 % (ref 36.0–46.0)
Hemoglobin: 14.1 g/dL (ref 12.0–15.0)
Lymphocytes Relative: 41.3 % (ref 12.0–46.0)
Lymphs Abs: 1.8 10*3/uL (ref 0.7–4.0)
MCHC: 33.3 g/dL (ref 30.0–36.0)
MCV: 89.4 fl (ref 78.0–100.0)
Monocytes Absolute: 0.3 10*3/uL (ref 0.1–1.0)
Monocytes Relative: 7.6 % (ref 3.0–12.0)
Neutro Abs: 2 10*3/uL (ref 1.4–7.7)
Neutrophils Relative %: 47.1 % (ref 43.0–77.0)
Platelets: 207 10*3/uL (ref 150.0–400.0)
RBC: 4.72 Mil/uL (ref 3.87–5.11)
RDW: 14.9 % (ref 11.5–15.5)
WBC: 4.3 10*3/uL (ref 4.0–10.5)

## 2020-10-06 LAB — HEPATIC FUNCTION PANEL
ALT: 19 U/L (ref 0–35)
AST: 14 U/L (ref 0–37)
Albumin: 4.1 g/dL (ref 3.5–5.2)
Alkaline Phosphatase: 91 U/L (ref 39–117)
Bilirubin, Direct: 0 mg/dL (ref 0.0–0.3)
Total Bilirubin: 0.4 mg/dL (ref 0.2–1.2)
Total Protein: 6.8 g/dL (ref 6.0–8.3)

## 2020-10-06 LAB — BASIC METABOLIC PANEL
BUN: 19 mg/dL (ref 6–23)
CO2: 27 mEq/L (ref 19–32)
Calcium: 9.4 mg/dL (ref 8.4–10.5)
Chloride: 103 mEq/L (ref 96–112)
Creatinine, Ser: 0.73 mg/dL (ref 0.40–1.20)
GFR: 94.32 mL/min (ref >60.00)
Glucose, Bld: 169 mg/dL — ABNORMAL HIGH (ref 70–99)
Potassium: 4.2 mEq/L (ref 3.5–5.1)
Sodium: 140 mEq/L (ref 135–145)

## 2020-10-06 LAB — HEMOGLOBIN A1C: Hgb A1c MFr Bld: 7.5 % — ABNORMAL HIGH (ref 4.6–6.5)

## 2020-10-06 LAB — LDL CHOLESTEROL, DIRECT: Direct LDL: 112 mg/dL

## 2020-10-06 LAB — LIPID PANEL
Cholesterol: 200 mg/dL (ref 0–200)
HDL: 50.3 mg/dL (ref >39.00)
NonHDL: 149.63
Total CHOL/HDL Ratio: 4
Triglycerides: 281 mg/dL — ABNORMAL HIGH (ref 0.0–149.0)
VLDL: 56.2 mg/dL — ABNORMAL HIGH (ref 0.0–40.0)

## 2020-10-06 LAB — TSH: TSH: 1.22 u[IU]/mL (ref 0.35–4.50)

## 2020-10-06 LAB — FERRITIN: Ferritin: 6.9 ng/mL — ABNORMAL LOW (ref 10.0–291.0)

## 2020-10-09 ENCOUNTER — Encounter: Payer: Self-pay | Admitting: Internal Medicine

## 2020-10-09 ENCOUNTER — Telehealth (INDEPENDENT_AMBULATORY_CARE_PROVIDER_SITE_OTHER): Payer: BC Managed Care – PPO | Admitting: Internal Medicine

## 2020-10-09 DIAGNOSIS — E1165 Type 2 diabetes mellitus with hyperglycemia: Secondary | ICD-10-CM | POA: Diagnosis not present

## 2020-10-09 DIAGNOSIS — I10 Essential (primary) hypertension: Secondary | ICD-10-CM

## 2020-10-09 DIAGNOSIS — E7800 Pure hypercholesterolemia, unspecified: Secondary | ICD-10-CM

## 2020-10-09 DIAGNOSIS — K219 Gastro-esophageal reflux disease without esophagitis: Secondary | ICD-10-CM

## 2020-10-09 DIAGNOSIS — Z862 Personal history of diseases of the blood and blood-forming organs and certain disorders involving the immune mechanism: Secondary | ICD-10-CM

## 2020-10-09 NOTE — Progress Notes (Signed)
Patient ID: Anne [name], female   DOB: Jun 18, 1965, 55 y.o.   MRN: [id number]   Virtual Visit via video Note  This visit type was conducted due to national recommendations for restrictions regarding the COVID-19 pandemic (e.g. social distancing).  This format is felt to be most appropriate for this patient at this time.  All issues noted in this document were discussed and addressed.  No physical exam was performed (except for noted visual exam findings with Video Visits).   I connected with Anne [name] by a video enabled telemedicine application or telephone and verified that I am speaking with the correct person using two identifiers. Location patient: home Location provider: work  Persons participating in the virtual visit: patient, provider  The limitations, risks, security and privacy concerns of performing an evaluation and management service by video and the availability of in person appointments. It has also been discussed with the patient that there may be a patient responsible charge related to this service. The patient expressed understanding and agreed to proceed.  Interactive audio and video telecommunications were attempted between this provider and patient, however failed, due to patient having technical difficulties OR patient did not have access to video capability.  I could visualize and hear [name], but she could only hear me.  She did not want to continue the visit like this.  she preferred to continue and complete visit with audio only.  Reason for visit: scheduled follow up.   HPI: Follow up regarding her blood sugar, blood pressure and cholesterol.  Discussed labs.  Discussed elevated a1c.  She reports that she has not been watching her diet.  Drinking increased sweet tea.  Eating out, for example - [employer].  Not eating right.  Not exercising.  Discussed treatment options for her diabetes.  She is agreeable to look into options.  No chest pain or sob reported.   No abdominal pain reported.  Bowels moving.  No urine change reported. Increased stress.  Has been recently working increased hours and helping to care for her grandmother.     ROS: See pertinent positives and negatives per HPI.  Past Medical History:  Diagnosis Date  . Anemia   . Diabetes mellitus ([facility])   . Gastritis   . History of abnormal Pap smear    s/p LEEP, h/o HPV  . Hypercholesterolemia   . Hypertension   . IBS (irritable bowel syndrome)     Past Surgical History:  Procedure Laterality Date  . APPENDECTOMY    . BREAST BIOPSY Left 08/25/2017   left breast biopsy neg  . COLONOSCOPY WITH PROPOFOL N/A 11/28/2014   Procedure: COLONOSCOPY WITH PROPOFOL;  Surgeon: Christena Deem, MD;  Location: [facility] ENDOSCOPY;  Service: Endoscopy;  Laterality: N/A;    Family History  Problem Relation Age of Onset  . Diabetes Paternal Grandmother   . Emphysema Paternal Grandfather   . Breast cancer Sister 82  . Colon cancer Neg Hx     SOCIAL HX: reviewed.    Current Outpatient Medications:  .  acyclovir (ZOVIRAX) 400 MG tablet, One tablet tid for 5 days., Disp: 15 tablet, Rfl: 1 .  atorvastatin (LIPITOR) 10 MG tablet, TAKE 1 TABLET BY MOUTH EVERY DAY, Disp: 90 tablet, Rfl: 2 .  Blood Glucose Monitoring Suppl (CONTOUR BLOOD GLUCOSE SYSTEM) DEVI, Use to test blood sugar up to three times daily - Using [employer] System, Disp: 1 Device, Rfl: 0 .  fish oil-omega-3 fatty acids 1000 MG capsule, Take 1 g by  mouth daily., Disp: , Rfl:  .  fluticasone (FLONASE) 50 MCG/ACT nasal spray, Place 2 sprays into both nostrils daily., Disp: 16 g, Rfl: 6 .  glucose blood (BAYER CONTOUR TEST) test strip, Use as instructed to test blood sugar three times daily - Using [employer] System, Disp: 100 each, Rfl: 12 .  JARDIANCE 25 MG TABS tablet, TAKE 25 MG BY MOUTH DAILY., Disp: 90 tablet, Rfl: 1 .  Lancet Devices (MICROLET NEXT LANCING DEVICE) MISC, 1 each by Does not apply route daily., Disp: 1  each, Rfl: 0 .  losartan (COZAAR) 25 MG tablet, TAKE 1 TABLET (25 MG TOTAL) BY MOUTH DAILY., Disp: 90 tablet, Rfl: 1 .  metFORMIN (GLUCOPHAGE-XR) 500 MG 24 hr tablet, TAKE 2 TABLETS BY MOUTH TWICE A DAY, Disp: 360 tablet, Rfl: 1 .  MICROLET LANCETS MISC, 1 each by Does not apply route 3 (three) times daily., Disp: 100 each, Rfl: 11 .  Multiple Vitamin (MULTIVITAMIN) tablet, Take 1 tablet by mouth daily., Disp: , Rfl:  .  nystatin cream (MYCOSTATIN), Apply 1 application topically 2 (two) times daily., Disp: 30 g, Rfl: 0 .  pantoprazole (PROTONIX) 40 MG tablet, TAKE 1 TABLET BY MOUTH EVERY DAY, Disp: 90 tablet, Rfl: 2 .  venlafaxine XR (EFFEXOR-XR) 37.5 MG 24 hr capsule, TAKE 1 CAPSULE BY MOUTH DAILY WITH BREAKFAST., Disp: 90 capsule, Rfl: 1  EXAM:  GENERAL: alert, oriented, appears well and in no acute distress  HEENT: atraumatic, conjunttiva clear, no obvious abnormalities on inspection of external nose and ears  NECK: normal movements of the head and neck  LUNGS: on inspection no signs of respiratory distress, breathing rate appears normal, no obvious gross SOB, gasping or wheezing  CV: no obvious cyanosis  PSYCH/NEURO: pleasant and cooperative, no obvious depression or anxiety, speech and thought processing grossly intact  ASSESSMENT AND PLAN:  Discussed the following assessment and plan:  Problem List Items Addressed This Visit    GERD (gastroesophageal reflux disease)    No upper symptoms reported. On protonix.       History of anemia    hgb wnl on recent check.  Has seen GI.  Follow.  Need latest colonoscopy report.       Relevant Orders   CBC with Differential/Platelet   Ferritin   Hypercholesterolemia    Continue lipitor.  Low cholesterol diet and exercise.  Follow lipid panel and liver function tests.        Relevant Orders   Hepatic function panel   Lipid panel   Hypertension    Continue losartan.  Follow pressures.  Follow metabolic panel.       Type 2  diabetes mellitus with hyperglycemia ([facility])    Continues on metformin and jardiance.  Discussed elevated a1c. Discussed other treatment options.  Discussed diet and exercise.  She plans to get more serious about her diet and exercise.  She is interested in possibly starting GLP-1 after discussion.  CCM referral to discuss.       Relevant Orders   AMB Referral to Community Care Coordinaton   Hemoglobin A1c   Basic metabolic panel   Microalbumin / creatinine urine ratio       I discussed the assessment and treatment plan with the patient. The patient was provided an opportunity to ask questions and all were answered. The patient agreed with the plan and demonstrated an understanding of the instructions.   The patient was advised to call back or seek an in-person evaluation if the symptoms  worsen or if the condition fails to improve as anticipated.  I provided 25 minutes of non-face-to-face time during this encounter.   Dale Eagar, MD

## 2020-10-10 ENCOUNTER — Telehealth: Payer: Self-pay | Admitting: Internal Medicine

## 2020-10-10 ENCOUNTER — Encounter: Payer: Self-pay | Admitting: Internal Medicine

## 2020-10-10 DIAGNOSIS — Z114 Encounter for screening for human immunodeficiency virus [HIV]: Secondary | ICD-10-CM

## 2020-10-10 DIAGNOSIS — Z1159 Encounter for screening for other viral diseases: Secondary | ICD-10-CM

## 2020-10-10 NOTE — Telephone Encounter (Signed)
Need colonoscopy report - recently evaluated GI [facility].  Also need ophthalmology exam.  Please ask pt if with next labs ok to have hepatitis C and HIV checked.  Explain these are now considered routine screening - (a one time screening).  If agreeable, will need to order.

## 2020-10-10 NOTE — Assessment & Plan Note (Signed)
No upper symptoms reported.  On protonix.   

## 2020-10-10 NOTE — Assessment & Plan Note (Signed)
Continue losartan.  Follow pressures.  Follow metabolic panel.  

## 2020-10-10 NOTE — Assessment & Plan Note (Signed)
Continues on metformin and jardiance.  Discussed elevated a1c. Discussed other treatment options.  Discussed diet and exercise.  She plans to get more serious about her diet and exercise.  She is interested in possibly starting GLP-1 after discussion.  CCM referral to discuss.

## 2020-10-10 NOTE — Assessment & Plan Note (Signed)
Continue lipitor.  Low cholesterol diet and exercise.  Follow lipid panel and liver function tests.   

## 2020-10-10 NOTE — Assessment & Plan Note (Addendum)
hgb wnl on recent check.  Has seen GI.  Follow.  Need latest colonoscopy report.

## 2020-10-12 ENCOUNTER — Telehealth: Payer: Self-pay

## 2020-10-12 NOTE — Chronic Care Management (AMB) (Signed)
  Care Management   Note  10/12/2020 Name: Anne [name] MRN: [id number] DOB: 05/06/1966  Anne [name] is a 55 y.o. year old female who is a primary care patient of Dale Thrall, MD. I reached out to Shelby Dubin by phone today in response to a referral sent by Anne [name]'s PCP Dale Humacao, MD .    Anne [name] was given information about care management services today including:  1. Care management services include personalized support from designated clinical staff supervised by her physician, including individualized plan of care and coordination with other care providers 2. 24/7 contact phone numbers for assistance for urgent and routine care needs. 3. The patient may stop care management services at any time by phone call to the office staff.  Patient agreed to services and verbal consent obtained.   Follow up plan: Telephone appointment with care management team member scheduled for:10/20/2020  Penne Lash, RMA Care Guide, Embedded Care Coordination [facility]  [place], [place] [address] Direct Dial: [phone] [email or web address]

## 2020-10-12 NOTE — Chronic Care Management (AMB) (Signed)
  Care Management   Outreach Note  10/12/2020 Name: Anyra Kaufman MRN: [id number] DOB: 30-Nov-1965  Referred by: Dale [place], MD Reason for referral : Care Coordination [facility] schedule referral with Pharm D )   An unsuccessful telephone outreach was attempted today. The patient was referred to the case management team for assistance with care management and care coordination.   Follow Up Plan: A HIPAA compliant phone message was left for the patient providing contact information and requesting a return call.  The care management team will reach out to the patient again over the next 5 days.  If patient returns call to provider office, please advise to call Embedded Care Management Care Guide Penne Lash  at [phone]  Penne Lash, RMA Care Guide, Embedded Care Coordination [facility]  [place], [place] [address] Direct Dial: [phone] [email or web address]: [email or web address]

## 2020-10-13 NOTE — Telephone Encounter (Signed)
Will request records. Patient is agreeable to HIV and hep C screening. Will order labs

## 2020-10-14 NOTE — Addendum Note (Signed)
Addended by: Larry Sierras on: 10/14/2020 10:30 AM   Modules accepted: Orders

## 2020-10-15 NOTE — Addendum Note (Signed)
Addended by: Larry Sierras on: 10/15/2020 01:23 PM   Modules accepted: Orders

## 2020-10-20 ENCOUNTER — Ambulatory Visit: Payer: BC Managed Care – PPO | Admitting: Pharmacist

## 2020-10-20 DIAGNOSIS — E1165 Type 2 diabetes mellitus with hyperglycemia: Secondary | ICD-10-CM

## 2020-10-20 DIAGNOSIS — E7800 Pure hypercholesterolemia, unspecified: Secondary | ICD-10-CM

## 2020-10-20 DIAGNOSIS — I10 Essential (primary) hypertension: Secondary | ICD-10-CM

## 2020-10-20 DIAGNOSIS — Z862 Personal history of diseases of the blood and blood-forming organs and certain disorders involving the immune mechanism: Secondary | ICD-10-CM

## 2020-10-20 MED ORDER — OZEMPIC (0.25 OR 0.5 MG/DOSE) 2 MG/1.5ML ~~LOC~~ SOPN: PEN_INJECTOR | SUBCUTANEOUS | 2 refills | Status: DC

## 2020-10-20 NOTE — Chronic Care Management (AMB) (Signed)
Care Management   Pharmacy Note  10/20/2020 Name: Anne [name] MRN: [id number] DOB: [birth year]  Subjective: Anne [name] is a 55 y.o. year old female who is a primary care patient of [name], MD. The Care Management team was consulted for assistance with care management and care coordination needs.    Engaged with patient by telephone for initial visit in response to provider referral for pharmacy case management and/or care coordination services.   The patient was given information about Care Management services today including:  1. Care Management services includes personalized support from designated clinical staff supervised by the patient's primary care provider, including individualized plan of care and coordination with other care providers. 2. 24/7 contact phone numbers for assistance for urgent and routine care needs. 3. The patient may stop case management services at any time by phone call to the office staff.  Patient agreed to services and consent obtained.  Assessment:  Review of patient status, including review of consultants reports, laboratory and other test data, was performed as part of comprehensive evaluation and provision of chronic care management services.   SDOH (Social Determinants of Health) assessments and interventions performed:  SDOH Interventions   Flowsheet Row Most Recent Value  SDOH Interventions   Financial Strain Interventions Intervention Not Indicated       Objective:  Lab Results  Component Value Date   CREATININE 0.73 10/06/2020   CREATININE 0.77 07/21/2020   CREATININE 0.70 07/07/2020    Lab Results  Component Value Date   HGBA1C 7.5 (H) 10/06/2020       Component Value Date/Time   CHOL 200 10/06/2020 0852   TRIG 281.0 (H) 10/06/2020 0852   HDL 50.30 10/06/2020 0852   CHOLHDL 4 10/06/2020 0852   VLDL 56.2 (H) 10/06/2020 0852   LDLCALC 69 04/08/2019 0820   LDLDIRECT 112.0 10/06/2020 0852    Clinical  ASCVD: No  The 10-year ASCVD risk score [name], et al., 2013) is: 3.9%   Values used to calculate the score:     Age: 41 years     Sex: Female     Is Non-Hispanic African American: No     Diabetic: Yes     Tobacco smoker: No     Systolic Blood Pressure: 122 mmHg     Is BP treated: Yes     HDL Cholesterol: 50.3 mg/dL     Total Cholesterol: 200 mg/dL     BP Readings from Last 3 Encounters:  07/10/20 122/76  02/29/20 133/87  10/17/19 110/84    Care Plan  Allergies  Allergen Reactions  . Trulicity [Dulaglutide] Nausea And Vomiting  . Januvia [Sitagliptin] Other (See Comments)    Elbow/Knee/Jaw joint pain, irritability and bloating    Medications Reviewed Today    Reviewed by Lourena Simmonds, RPH-CPP (Pharmacist) on 10/20/20 at 1418  Med List Status: <None>  Medication Order Taking? Sig Documenting Provider Last Dose Status Informant  acyclovir (ZOVIRAX) 400 MG tablet [id number] No One tablet tid for 5 days.  Patient not taking: Reported on 10/20/2020   [name], MD Not Taking Active   atorvastatin (LIPITOR) 10 MG tablet [id number] Yes TAKE 1 TABLET BY MOUTH EVERY DAY [name], MD Taking Active   Blood Glucose Monitoring Suppl [facility] BLOOD GLUCOSE SYSTEM) DEVI [id number] No Use to test blood sugar up to three times daily - Using Garlan Fillers System  Patient not taking: Reported on 10/20/2020   [name], MD Not Taking Active   fish  oil-omega-3 fatty acids 1000 MG capsule [id number] No Take 1 g by mouth daily.  Patient not taking: Reported on 10/20/2020   [provider] Not Taking Active Self  fluticasone (FLONASE) 50 MCG/ACT nasal spray [id number] Yes Place 2 sprays into both nostrils daily. [name], MD Taking Active   glucose blood (BAYER CONTOUR TEST) test strip [id number] No Use as instructed to test blood sugar three times daily - Using Garlan Fillers System  Patient not taking: Reported on 10/20/2020   [name], MD Not  Taking Active   JARDIANCE 25 MG TABS tablet [id number] Yes TAKE 25 MG BY MOUTH DAILY. [name], MD Taking Active   Lancet Devices (MICROLET NEXT LANCING DEVICE) MISC [id number] Yes 1 each by Does not apply route daily. [name], MD Taking Active   losartan (COZAAR) 25 MG tablet [id number] Yes TAKE 1 TABLET (25 MG TOTAL) BY MOUTH DAILY. [name], MD Taking Active   metFORMIN (GLUCOPHAGE-XR) 500 MG 24 hr tablet [id number] Yes TAKE 2 TABLETS BY MOUTH TWICE A [name], MD Taking Active   MICROLET LANCETS MISC [id number] Yes 1 each by Does not apply route 3 (three) times daily. [name], MD Taking Active   Multiple Vitamin (MULTIVITAMIN) tablet [id number] Yes Take 1 tablet by mouth daily. [provider] Taking Active Self  nystatin cream (MYCOSTATIN) [id number] Yes Apply 1 application topically 2 (two) times daily. [name], MD Taking Active   pantoprazole (PROTONIX) 40 MG tablet [id number] Yes TAKE 1 TABLET BY MOUTH EVERY DAY [name], MD Taking Active   venlafaxine XR (EFFEXOR-XR) 37.5 MG 24 hr capsule [id number] Yes TAKE 1 CAPSULE BY MOUTH DAILY WITH BREAKFAST. [name], MD Taking Active           Patient Active Problem List   Diagnosis Date Noted  . Hot flashes 02/29/2020  . Type 2 diabetes mellitus with hyperglycemia (HCC) 10/28/2019  . Menstrual abnormality 10/27/2018  . Mass of lower outer quadrant of left breast 08/21/2017  . Vaginal irritation 04/05/2015  . Obstructive sleep apnea 02/01/2015  . Family history of colonic polyps 09/08/2014  . Health care maintenance 09/08/2014  . History of HPV infection 09/19/2013  . GERD (gastroesophageal reflux disease) 01/24/2013  . Hypertension 06/12/2012  . Hypercholesterolemia 06/12/2012  . History of anemia 06/12/2012    Conditions to be addressed/monitored: HTN, HLD and DMII  Care Plan : Medication Management  Updates made by Lourena Simmonds, RPH-CPP since 10/20/2020 12:00 AM     Problem: Diabetes, HLD, HTN     Long-Range Goal: Disease Progression Prevention   Start Date: 10/20/2020  This Visit's Progress: On track  Priority: High  Note:   Current Barriers:  . Unable to achieve control of diabetes   Pharmacist Clinical Goal(s):  Marland Kitchen Over the next 90 days, patient will achieve control of diabetes as evidenced by A1c  through collaboration with PharmD and provider.   Interventions: . 1:1 collaboration with Dale [place], MD regarding development and update of comprehensive plan of care as evidenced by provider attestation and co-signature . Inter-disciplinary care team collaboration (see longitudinal plan of care) . Comprehensive medication review performed; medication list updated in electronic medical record  SDOH: . Part time job, but always busy additionally helping her grandmother. Lives alone.   Diabetes: . Uncontrolled; current treatment: metformin XR 1000 mg BID, Jardiance 25 mg daily o Hx Trulicity: documented as nausea/vomiting  . Current glucose readings: none  . Current meal patterns: breakfast: protein bars (needs quick and  easy); lunch or dinner will eat out: burger, grilled chicken salad w/ ranch, taco salads from [employer]; cooking for herself; snacks: sometimes bowl of cereal, pack of crackers; drinks: tea, milk; eating more salads, cut back on tea . Current exercise: Yard work, but nothing  . Extensive discussion regarding dietary optimization. Recommended changing to half and half tea, discussed reducing carbohydrates. Discussed picking one day a week to cook and have leftovers a few days.  . Discussed benefit of incorporating physical activity, starting with an attainable goal of 15-20 minutes 2-3 days weekly.  . Discussed trial of Ozempic. Per chart, hx intolerance w/ Trulicity, but may be able to tolerate Ozempic due to slow titration. No hx MTC, pancreatitis, gallbladder disease. Counseled on mechanism of action, side effects. Discussed  administration. Start Ozempic 0.25 mg weekly x 4 weeks then increase to 0.5 mg weekly. Continue Jardiance 25 mg daily and metformin XR 1000 mg BID.  Marland Kitchen Advised to resume checking BG twice daily, fasting and post prandial. Educated on goal A1c, goal fasting, and goal 2 hour post prandial.   Hypertension: . Controlled; current treatment: losartan 25 mg daily;  . Current home readings: not checking . Will discuss benefit of home monitoring moving forward.   Hyperlipidemia: . Uncontrolled; current treatment: atorvastatin 10 mg daily, OTC omega 3 fatty avids  . Discussed goal LDL <100. Reviewed importance of adherence.  . Discussed improved consistent benefit of TG lowering with Vascepa vs OTC omega 3 fatty acids, including CV risk reduction. Patient requests to start on Ozempic and see how she tolerates, then discuss Vascepa moving forward. Patient also concerned about pill burden. Will revisit at next visit. Could also consider increasing statin intensity first instead.   GERD: . Controlled per patient report; current regimen: pantoprazole 40 mg daily . Continue current regimen at this time, along with avoidance of trigger foods.   Depression/Anxiety: . Moderately well controlled at this time per patient report; current regimen: venlafaxine 37.5 mg daily . Continue current regimen at this time.   Patient Goals/Self-Care Activities . Over the next 90 days, patient will:  - take medications as prescribed focus on medication adherence check glucose twice daily, document, and provide at future appointments check blood pressure periodically, document, and provide at future appointments target a minimum of 150 minutes of moderate intensity exercise weekly engage in dietary modifications by reducing carbohydrate consumption  Follow Up Plan: Telephone follow up appointment with care management team member scheduled for: ~ 6 weeks       Medication Assistance:  None required.  Patient affirms  current coverage meets needs.  Follow Up:  Patient agrees to Care Plan and Follow-up.  Plan: Telephone follow up appointment with care management team member scheduled for:  ~ 6 weeks  Catie Feliz Beam, PharmD, [place], CPP Clinical Pharmacist [facility] [employer] [phone]

## 2020-10-20 NOTE — Patient Instructions (Signed)
Visit Information  PATIENT GOALS:  Goals Addressed              This Visit's Progress     Patient Stated   .  Medication Monitoring (pt-stated)        Patient Goals/Self-Care Activities . Over the next 90 days, patient will:  - take medications as prescribed focus on medication adherence check glucose twice daily, document, and provide at future appointments check blood pressure periodically, document, and provide at future appointments target a minimum of 150 minutes of moderate intensity exercise weekly engage in dietary modifications by reducing carbohydrate consumption       Anne [name] was given information about Care Management services by the embedded care coordination team including:  1. Care Management services include personalized support from designated clinical staff supervised by her physician, including individualized plan of care and coordination with other care providers 2. 24/7 contact phone numbers for assistance for urgent and routine care needs. 3. The patient may stop CCM services at any time (effective at the end of the month) by phone call to the office staff.  Patient agreed to services and verbal consent obtained.   Patient verbalizes understanding of instructions provided today and agrees to view in MyChart.   Plan: Telephone follow up appointment with care management team member scheduled for:  ~ 6 weeks  Catie Feliz Beam, PharmD, [place], CPP Clinical Pharmacist [facility] [employer] [phone]

## 2020-10-26 ENCOUNTER — Ambulatory Visit: Payer: BC Managed Care – PPO | Admitting: Pharmacist

## 2020-10-26 DIAGNOSIS — I10 Essential (primary) hypertension: Secondary | ICD-10-CM

## 2020-10-26 DIAGNOSIS — E7800 Pure hypercholesterolemia, unspecified: Secondary | ICD-10-CM

## 2020-10-26 DIAGNOSIS — E1165 Type 2 diabetes mellitus with hyperglycemia: Secondary | ICD-10-CM

## 2020-10-26 NOTE — Patient Instructions (Signed)
Visit Information  Goals Addressed              This Visit's Progress     Patient Stated   .  Medication Monitoring (pt-stated)        Patient Goals/Self-Care Activities . Over the next 90 days, patient will:  - take medications as prescribed focus on medication adherence check glucose twice daily, document, and provide at future appointments check blood pressure periodically, document, and provide at future appointments target a minimum of 150 minutes of moderate intensity exercise weekly engage in dietary modifications by reducing carbohydrate consumption        Patient verbalizes understanding of instructions provided today and agrees to view in MyChart.   Plan: Telephone follow up appointment with care management team member scheduled for:  ~ 5 weeks as previously scheduled  Catie Feliz Beam, PharmD, [place], CPP Clinical Pharmacist [facility] [employer] [phone]

## 2020-10-26 NOTE — Chronic Care Management (AMB) (Signed)
Care Management   Pharmacy Note  10/26/2020 Name: Anne [name] MRN: [id number] DOB: [birth year]  Subjective: Anne [name] is a 55 y.o. year old female who is a primary care patient of Anne [place], MD. The Care Management team was consulted for assistance with care management and care coordination needs.    Engaged with patient via mychart for follow up on medication management in response to provider referral for pharmacy case management and/or care coordination services.   The patient was given information about Care Management services today including:  1. Care Management services includes personalized support from designated clinical staff supervised by the patient's primary care provider, including individualized plan of care and coordination with other care providers. 2. 24/7 contact phone numbers for assistance for urgent and routine care needs. 3. The patient may stop case management services at any time by phone call to the office staff.  Patient agreed to services and consent obtained.  Assessment:  Review of patient status, including review of consultants reports, laboratory and other test data, was performed as part of comprehensive evaluation and provision of chronic care management services.   SDOH (Social Determinants of Health) assessments and interventions performed:  SDOH Interventions   Flowsheet Row Most Recent Value  SDOH Interventions   Financial Strain Interventions Intervention Not Indicated       Objective:  Lab Results  Component Value Date   CREATININE 0.73 10/06/2020   CREATININE 0.77 07/21/2020   CREATININE 0.70 07/07/2020    Lab Results  Component Value Date   HGBA1C 7.5 (H) 10/06/2020       Component Value Date/Time   CHOL 200 10/06/2020 0852   TRIG 281.0 (H) 10/06/2020 0852   HDL 50.30 10/06/2020 0852   CHOLHDL 4 10/06/2020 0852   VLDL 56.2 (H) 10/06/2020 0852   LDLCALC 69 04/08/2019 0820   LDLDIRECT 112.0 10/06/2020  0852     Clinical ASCVD: No  The 10-year ASCVD risk score Anne George DC Jr., et al., 2013) is: 3.9%   Values used to calculate the score:     Age: 84 years     Sex: Female     Is Non-Hispanic African American: No     Diabetic: Yes     Tobacco smoker: No     Systolic Blood Pressure: 122 mmHg     Is BP treated: Yes     HDL Cholesterol: 50.3 mg/dL     Total Cholesterol: 200 mg/dL     BP Readings from Last 3 Encounters:  07/10/20 122/76  02/29/20 133/87  10/17/19 110/84    Care Plan  Allergies  Allergen Reactions  . Trulicity [Dulaglutide] Nausea And Vomiting  . Januvia [Sitagliptin] Other (See Comments)    Elbow/Knee/Jaw joint pain, irritability and bloating    Medications Reviewed Today    Reviewed by Lourena Simmonds, RPH-CPP (Pharmacist) on 10/20/20 at 1418  Med List Status: <None>  Medication Order Taking? Sig Documenting Provider Last Dose Status Informant  acyclovir (ZOVIRAX) 400 MG tablet [id number] No One tablet tid for 5 days.  Patient not taking: Reported on 10/20/2020   Anne [place], MD Not Taking Active   atorvastatin (LIPITOR) 10 MG tablet [id number] Yes TAKE 1 TABLET BY MOUTH EVERY DAY Anne [place], MD Taking Active   Blood Glucose Monitoring Suppl [facility] BLOOD GLUCOSE SYSTEM) DEVI [id number] No Use to test blood sugar up to three times daily - Using Garlan Fillers System  Patient not taking: Reported on 10/20/2020   Anne [place], MD Not Taking  Active   fish oil-omega-3 fatty acids 1000 MG capsule [id number] No Take 1 g by mouth daily.  Patient not taking: Reported on 10/20/2020   [provider] Not Taking Active Self  fluticasone (FLONASE) 50 MCG/ACT nasal spray [id number] Yes Place 2 sprays into both nostrils daily. Anne [place], MD Taking Active   glucose blood (BAYER CONTOUR TEST) test strip [id number] No Use as instructed to test blood sugar three times daily - Using Garlan Fillers System  Patient not taking: Reported on 10/20/2020    Anne [place], MD Not Taking Active   JARDIANCE 25 MG TABS tablet [id number] Yes TAKE 25 MG BY MOUTH DAILY. Anne [place], MD Taking Active   Lancet Devices (MICROLET NEXT LANCING DEVICE) MISC [id number] Yes 1 each by Does not apply route daily. Anne [place], MD Taking Active   losartan (COZAAR) 25 MG tablet [id number] Yes TAKE 1 TABLET (25 MG TOTAL) BY MOUTH DAILY. Anne [place], MD Taking Active   metFORMIN (GLUCOPHAGE-XR) 500 MG 24 hr tablet [id number] Yes TAKE 2 TABLETS BY MOUTH TWICE A Anne [place]on, MD Taking Active   MICROLET LANCETS MISC [id number] Yes 1 each by Does not apply route 3 (three) times daily. Anne [place], MD Taking Active   Multiple Vitamin (MULTIVITAMIN) tablet [id number] Yes Take 1 tablet by mouth daily. [provider] Taking Active Self  nystatin cream (MYCOSTATIN) [id number] Yes Apply 1 application topically 2 (two) times daily. Anne [place], MD Taking Active   pantoprazole (PROTONIX) 40 MG tablet [id number] Yes TAKE 1 TABLET BY MOUTH EVERY DAY Anne [name], MD Taking Active   venlafaxine XR (EFFEXOR-XR) 37.5 MG 24 hr capsule [id number] Yes TAKE 1 CAPSULE BY MOUTH DAILY WITH BREAKFAST. Anne [place], MD Taking Active           Patient Active Problem List   Diagnosis Date Noted  . Hot flashes 02/29/2020  . Type 2 diabetes mellitus with hyperglycemia (HCC) 10/28/2019  . Menstrual abnormality 10/27/2018  . Mass of lower outer quadrant of left breast 08/21/2017  . Vaginal irritation 04/05/2015  . Obstructive sleep apnea 02/01/2015  . Family history of colonic polyps 09/08/2014  . Health care maintenance 09/08/2014  . History of HPV infection 09/19/2013  . GERD (gastroesophageal reflux disease) 01/24/2013  . Hypertension 06/12/2012  . Hypercholesterolemia 06/12/2012  . History of anemia 06/12/2012    Conditions to be addressed/monitored: HTN, HLD and DMII  Care Plan : Medication Management  Updates made by Lourena Simmonds, RPH-CPP  since 10/26/2020 12:00 AM    Problem: Diabetes, HLD, HTN     Long-Range Goal: Disease Progression Prevention   Start Date: 10/20/2020  This Visit's Progress: On track  Recent Progress: On track  Priority: High  Note:   Current Barriers:  . Unable to achieve control of diabetes   Pharmacist Clinical Goal(s):  Marland Kitchen Over the next 90 days, patient will achieve control of diabetes as evidenced by A1c  through collaboration with PharmD and provider.   Interventions: . 1:1 collaboration with Anne , MD regarding development and update of comprehensive plan of care as evidenced by provider attestation and co-signature . Inter-disciplinary care team collaboration (see longitudinal plan of care) . Comprehensive medication review performed; medication list updated in electronic medical record  SDOH: . Part time job, but always busy additionally helping her grandmother. Lives alone.   Diabetes: . Uncontrolled; current treatment: metformin XR 1000 mg BID, Jardiance 25 mg daily, Ozempic 0.25 mg (started on Wednesday) o Hx Trulicity: documented as nausea/vomiting  .  Reports episode of vomiting on Friday and Saturday. Also reports headaches and hot flashes.   . Hold Ozempic for 1 week. Restart at lower dose - click up halfway between 0 and 0.25 mg (~ 10 clicks).   Hypertension: . Controlled; current treatment: losartan 25 mg daily;  . Current home readings: not checking . Will discuss benefit of home monitoring moving forward.   Hyperlipidemia: . Uncontrolled; current treatment: atorvastatin 10 mg daily, OTC omega 3 fatty avids  . Discussed goal LDL <100. Reviewed importance of adherence.  . Previously discussed improved consistent benefit of TG lowering with Vascepa vs OTC omega 3 fatty acids, including CV risk reduction. Patient requested to start on Ozempic and see how she tolerates, then discuss Vascepa moving forward. Patient also concerned about pill burden. Will revisit at next  visit. Could also consider increasing statin intensity first instead.   GERD: . Controlled per patient report; current regimen: pantoprazole 40 mg daily . Previously recommended to continue current regimen at this time, along with avoidance of trigger foods.   Depression/Anxiety: . Moderately well controlled at this time per patient report; current regimen: venlafaxine 37.5 mg daily . Previously recommended to continue current regimen at this time.   Iron Deficiency: . Last ferritin level low; current regimen: Ferrex 150 mg every other day; asks if she needs to increase to daily . Previously recommended to increase to daily administration.  Patient Goals/Self-Care Activities . Over the next 90 days, patient will:  - take medications as prescribed focus on medication adherence check glucose twice daily, document, and provide at future appointments check blood pressure periodically, document, and provide at future appointments target a minimum of 150 minutes of moderate intensity exercise weekly engage in dietary modifications by reducing carbohydrate consumption  Follow Up Plan: Telephone follow up appointment with care management team member scheduled for: ~ 5 weeks as previously scheduled        Medication Assistance:  None required.  Patient affirms current coverage meets needs.  Follow Up:  Patient agrees to Care Plan and Follow-up.  Plan: Telephone follow up appointment with care management team member scheduled for:  ~ 5 weeks as previously scheduled  Catie Feliz Beam, PharmD, [place], CPP Clinical Pharmacist [employer] [employer] [phone]

## 2020-11-04 ENCOUNTER — Other Ambulatory Visit: Payer: Self-pay | Admitting: Internal Medicine

## 2020-11-04 ENCOUNTER — Ambulatory Visit: Payer: BC Managed Care – PPO | Admitting: Pharmacist

## 2020-11-04 DIAGNOSIS — I10 Essential (primary) hypertension: Secondary | ICD-10-CM

## 2020-11-04 DIAGNOSIS — E7800 Pure hypercholesterolemia, unspecified: Secondary | ICD-10-CM

## 2020-11-04 DIAGNOSIS — E1165 Type 2 diabetes mellitus with hyperglycemia: Secondary | ICD-10-CM

## 2020-11-04 NOTE — Patient Instructions (Signed)
Visit Information  Goals Addressed              This Visit's Progress     Patient Stated   .  Medication Monitoring (pt-stated)        Patient Goals/Self-Care Activities . Over the next 90 days, patient will:  - take medications as prescribed focus on medication adherence check glucose twice daily, document, and provide at future appointments check blood pressure periodically, document, and provide at future appointments target a minimum of 150 minutes of moderate intensity exercise weekly engage in dietary modifications by reducing carbohydrate consumption        Patient verbalizes understanding of instructions provided today and agrees to view in MyChart.   Plan: Telephone follow up appointment with care management team member scheduled for:  ~ 4 weeks as previously scheduled  Catie Feliz Beam, PharmD, [place], CPP Clinical Pharmacist [facility] [employer] [phone]

## 2020-11-04 NOTE — Chronic Care Management (AMB) (Signed)
Care Management   Pharmacy Note  11/04/2020 Name: Anne [name] MRN: [id number] DOB: Apr 12, 1966  Subjective: Anne [name] is a 55 y.o. year old female who is a primary care patient of Dale Newport, MD. The Care Management team was consulted for assistance with care management and care coordination needs.    Engaged with patient by telephone for response to message regarding medication management in response to provider referral for pharmacy case management and/or care coordination services.   The patient was given information about Care Management services today including:  1. Care Management services includes personalized support from designated clinical staff supervised by the patient's primary care provider, including individualized plan of care and coordination with other care providers. 2. 24/7 contact phone numbers for assistance for urgent and routine care needs. 3. The patient may stop case management services at any time by phone call to the office staff.  Patient agreed to services and consent obtained.  Assessment:  Review of patient status, including review of consultants reports, laboratory and other test data, was performed as part of comprehensive evaluation and provision of chronic care management services.   SDOH (Social Determinants of Health) assessments and interventions performed:    Objective:  Lab Results  Component Value Date   CREATININE 0.73 10/06/2020   CREATININE 0.77 07/21/2020   CREATININE 0.70 07/07/2020    Lab Results  Component Value Date   HGBA1C 7.5 (H) 10/06/2020       Component Value Date/Time   CHOL 200 10/06/2020 0852   TRIG 281.0 (H) 10/06/2020 0852   HDL 50.30 10/06/2020 0852   CHOLHDL 4 10/06/2020 0852   VLDL 56.2 (H) 10/06/2020 0852   LDLCALC 69 04/08/2019 0820   LDLDIRECT 112.0 10/06/2020 0852    Clinical ASCVD: No  The 10-year ASCVD risk score [name], et al., 2013) is: 3.9%   Values used to calculate  the score:     Age: 61 years     Sex: Female     Is Non-Hispanic African American: No     Diabetic: Yes     Tobacco smoker: No     Systolic Blood Pressure: 122 mmHg     Is BP treated: Yes     HDL Cholesterol: 50.3 mg/dL     Total Cholesterol: 200 mg/dL     BP Readings from Last 3 Encounters:  07/10/20 122/76  02/29/20 133/87  10/17/19 110/84    Care Plan  Allergies  Allergen Reactions  . Trulicity [Dulaglutide] Nausea And Vomiting  . Januvia [Sitagliptin] Other (See Comments)    Elbow/Knee/Jaw joint pain, irritability and bloating    Medications Reviewed Today    Reviewed by Lourena Simmonds, RPH-CPP (Pharmacist) on 10/20/20 at 1418  Med List Status: <None>  Medication Order Taking? Sig Documenting Provider Last Dose Status Informant  acyclovir (ZOVIRAX) 400 MG tablet [id number] No One tablet tid for 5 days.  Patient not taking: Reported on 10/20/2020   Dale [place], MD Not Taking Active   atorvastatin (LIPITOR) 10 MG tablet [id number] Yes TAKE 1 TABLET BY MOUTH EVERY DAY Dale [place], MD Taking Active   Blood Glucose Monitoring Suppl [facility] BLOOD GLUCOSE SYSTEM) DEVI [id number] No Use to test blood sugar up to three times daily - Using Garlan Fillers System  Patient not taking: Reported on 10/20/2020   Dale , MD Not Taking Active   fish oil-omega-3 fatty acids 1000 MG capsule [id number] No Take 1 g by mouth daily.  Patient not taking: Reported on  10/20/2020   [provider] Not Taking Active Self  fluticasone (FLONASE) 50 MCG/ACT nasal spray [id number] Yes Place 2 sprays into both nostrils daily. Dale [place], MD Taking Active   glucose blood (BAYER CONTOUR TEST) test strip [id number] No Use as instructed to test blood sugar three times daily - Using Garlan Fillers System  Patient not taking: Reported on 10/20/2020   Dale [place], MD Not Taking Active   JARDIANCE 25 MG TABS tablet [id number] Yes TAKE 25 MG BY MOUTH DAILY. Dale [place], MD  Taking Active   Lancet Devices (MICROLET NEXT LANCING DEVICE) MISC [id number] Yes 1 each by Does not apply route daily. Dale [place], MD Taking Active   losartan (COZAAR) 25 MG tablet [id number] Yes TAKE 1 TABLET (25 MG TOTAL) BY MOUTH DAILY. Dale [place], MD Taking Active   metFORMIN (GLUCOPHAGE-XR) 500 MG 24 hr tablet [id number] Yes TAKE 2 TABLETS BY MOUTH TWICE A [name], MD Taking Active   MICROLET LANCETS MISC [id number] Yes 1 each by Does not apply route 3 (three) times daily. Dale [place], MD Taking Active   Multiple Vitamin (MULTIVITAMIN) tablet [id number] Yes Take 1 tablet by mouth daily. [provider] Taking Active Self  nystatin cream (MYCOSTATIN) [id number] Yes Apply 1 application topically 2 (two) times daily. Dale [place], MD Taking Active   pantoprazole (PROTONIX) 40 MG tablet [id number] Yes TAKE 1 TABLET BY MOUTH EVERY DAY Dale [place], MD Taking Active   venlafaxine XR (EFFEXOR-XR) 37.5 MG 24 hr capsule [id number] Yes TAKE 1 CAPSULE BY MOUTH DAILY WITH BREAKFAST. Dale [place], MD Taking Active           Patient Active Problem List   Diagnosis Date Noted  . Hot flashes 02/29/2020  . Type 2 diabetes mellitus with hyperglycemia (HCC) 10/28/2019  . Menstrual abnormality 10/27/2018  . Mass of lower outer quadrant of left breast 08/21/2017  . Vaginal irritation 04/05/2015  . Obstructive sleep apnea 02/01/2015  . Family history of colonic polyps 09/08/2014  . Health care maintenance 09/08/2014  . History of HPV infection 09/19/2013  . GERD (gastroesophageal reflux disease) 01/24/2013  . Hypertension 06/12/2012  . Hypercholesterolemia 06/12/2012  . History of anemia 06/12/2012    Conditions to be addressed/monitored: HTN, HLD and DMII  Care Plan : Medication Management  Updates made by Lourena Simmonds, RPH-CPP since 11/04/2020 12:00 AM    Problem: Diabetes, HLD, HTN     Long-Range Goal: Disease Progression Prevention   Start Date:  10/20/2020  This Visit's Progress: On track  Recent Progress: On track  Priority: High  Note:   Current Barriers:  . Unable to achieve control of diabetes   Pharmacist Clinical Goal(s):  Marland Kitchen Over the next 90 days, patient will achieve control of diabetes as evidenced by A1c  through collaboration with PharmD and provider.   Interventions: . 1:1 collaboration with Dale , MD regarding development and update of comprehensive plan of care as evidenced by provider attestation and co-signature . Inter-disciplinary care team collaboration (see longitudinal plan of care) . Comprehensive medication review performed; medication list updated in electronic medical record  SDOH: . Part time job, but always busy additionally helping her grandmother. Lives alone.   Diabetes: . Uncontrolled; current treatment: metformin XR 1000 mg BID - reports that she holds her evening dose when her bedtime glucose readings are "normal", Jardiance 25 mg daily, Ozempic 0.25 mg - plans to restart at 10 clicks on Friday . Sent a MyChart message yesterday that she wanted to give  me an update. Reports that she feels poorly and is checking her sugar up to 8 times daily, but does not elaborate on many specific symptoms. She notes increased sweats since starting Ozempic. She notes that she is going to try the 10 clicks of Ozempic on Friday evening (halfway between 0 and 0.25 mg) to see if this improves how she feels. She notes some readings in the 80s that she is concerned about o Hx Trulicity: documented as nausea/vomiting  . Discussed that sweating/hot flash symptoms are not typically related to GLP1 agonists. Discussed that they can be related to hypoglycemic episodes, but generally not symptomatic hypoglycemic episodes in the 80s. Discussed that glucose readings in 80-110s are "normal", though discussed concepts of symptomatic hypoglycemia.  . Discussed that metformin, Jardiance, and Ozempic do not cause hypoglycemia.  Advised to take metformin regularly, rather than holding dose when glucose is normal.  . Encouraged to restart Ozempic at 10 clicks on Friday as previously discussed. Will follow for tolerability.   Hypertension: . Controlled; current treatment: losartan 25 mg daily;  . Current home readings: not checking . Will discuss benefit of home monitoring moving forward.   Hyperlipidemia: . Uncontrolled; current treatment: atorvastatin 10 mg daily, OTC omega 3 fatty avids  . Discussed goal LDL <100. Reviewed importance of adherence.  . Previously discussed improved consistent benefit of TG lowering with Vascepa vs OTC omega 3 fatty acids, including CV risk reduction. Patient requested to start on Ozempic and see how she tolerates, then discuss Vascepa moving forward. Patient also concerned about pill burden. Will revisit at next visit. Could also consider increasing statin intensity first instead.   GERD: . Controlled per patient report; current regimen: pantoprazole 40 mg daily . Previously recommended to continue current regimen at this time, along with avoidance of trigger foods.   Depression/Anxiety: . Moderately well controlled at this time per patient report; current regimen: venlafaxine 37.5 mg daily . Previously recommended to continue current regimen at this time.   Iron Deficiency: . Last ferritin level low; current regimen: Ferrex 150 mg every other day; asks if she needs to increase to daily . Previously recommended to increase to daily administration.  Patient Goals/Self-Care Activities . Over the next 90 days, patient will:  - take medications as prescribed focus on medication adherence check glucose twice daily, document, and provide at future appointments check blood pressure periodically, document, and provide at future appointments target a minimum of 150 minutes of moderate intensity exercise weekly engage in dietary modifications by reducing carbohydrate  consumption  Follow Up Plan: Telephone follow up appointment with care management team member scheduled for: ~ 4 weeks as previously scheduled        Medication Assistance:  None required.  Patient affirms current coverage meets needs.  Follow Up:  Patient agrees to Care Plan and Follow-up.  Plan: Telephone follow up appointment with care management team member scheduled for:  ~ 4 weeks as previously scheduled  Catie Feliz Beam, PharmD, [place], CPP Clinical Pharmacist [employer] [employer] [phone]

## 2020-12-01 ENCOUNTER — Ambulatory Visit: Payer: BC Managed Care – PPO | Admitting: Pharmacist

## 2020-12-01 DIAGNOSIS — E7800 Pure hypercholesterolemia, unspecified: Secondary | ICD-10-CM

## 2020-12-01 DIAGNOSIS — I10 Essential (primary) hypertension: Secondary | ICD-10-CM

## 2020-12-01 DIAGNOSIS — E1165 Type 2 diabetes mellitus with hyperglycemia: Secondary | ICD-10-CM

## 2020-12-01 MED ORDER — PEN NEEDLES 32G X 4 MM MISC: 1 refills | Status: DC

## 2020-12-01 NOTE — Chronic Care Management (AMB) (Signed)
Chronic Care Management Pharmacy Note  12/01/2020 Name:  Anne [name] MRN:  [id number] DOB:  06-22-1965   Subjective: Anne [name] is an 55 y.o. year old female who is a primary patient of Einar Pheasant, MD.  The CCM team was consulted for assistance with disease management and care coordination needs.    Engaged with patient by telephone for follow up visit in response to provider referral for pharmacy case management and/or care coordination services.   Consent to Services:  The patient was given information about Chronic Care Management services, agreed to services, and gave verbal consent prior to initiation of services.  Please see initial visit note for detailed documentation.   Patient Care Team: Einar Pheasant, MD as PCP - General (Internal Medicine) Bary Castilla Forest Gleason, MD as Consulting Physician (General Surgery) Einar Pheasant, MD as Referring Physician (Internal Medicine) De Hollingshead, RPH-CPP (Pharmacist)   Objective:  Lab Results  Component Value Date   CREATININE 0.73 10/06/2020   CREATININE 0.77 07/21/2020   CREATININE 0.70 07/07/2020    Lab Results  Component Value Date   HGBA1C 7.5 (H) 10/06/2020   Last diabetic Eye exam:  Lab Results  Component Value Date/Time   HMDIABEYEEXA No Retinopathy 02/21/2018 12:00 AM    Last diabetic Foot exam:  Lab Results  Component Value Date/Time   HMDIABFOOTEX on my exam.  07/10/2020 12:00 AM        Component Value Date/Time   CHOL 200 10/06/2020 0852   TRIG 281.0 (H) 10/06/2020 0852   HDL 50.30 10/06/2020 0852   CHOLHDL 4 10/06/2020 0852   VLDL 56.2 (H) 10/06/2020 0852   LDLCALC 69 04/08/2019 0820   LDLDIRECT 112.0 10/06/2020 0852    Hepatic Function Latest Ref Rng & Units 10/06/2020 07/07/2020 02/17/2020  Total Protein 6.0 - 8.3 g/dL 6.8 6.7 7.1  Albumin 3.5 - 5.2 g/dL 4.1 4.1 4.2  AST 0 - 37 U/L 14 16 15   ALT 0 - 35 U/L 19 20 19   Alk Phosphatase 39 - 117 U/L 91 83 68  Total  Bilirubin 0.2 - 1.2 mg/dL 0.4 0.5 0.3  Bilirubin, Direct 0.0 - 0.3 mg/dL 0.0 0.1 0.1    Lab Results  Component Value Date/Time   TSH 1.22 10/06/2020 08:52 AM   TSH 1.24 10/14/2019 08:35 AM    CBC Latest Ref Rng & Units 10/06/2020 07/07/2020 02/17/2020  WBC 4.0 - 10.5 K/uL 4.3 5.1 6.1  Hemoglobin 12.0 - 15.0 g/dL 14.1 13.5 13.7  Hematocrit 36.0 - 46.0 % 42.2 40.9 41.7  Platelets 150.0 - 400.0 K/uL 207.0 222.0 235.0    No results found for: VD25OH  Clinical ASCVD: No  The 10-year ASCVD risk score Mikey Bussing DC Jr., et al., 2013) is: 3.9%   Values used to calculate the score:     Age: 25 years     Sex: Female     Is Non-Hispanic African American: No     Diabetic: Yes     Tobacco smoker: No     Systolic Blood Pressure: 151 mmHg     Is BP treated: Yes     HDL Cholesterol: 50.3 mg/dL     Total Cholesterol: 200 mg/dL     Social History   Tobacco Use  Smoking Status Never  Smokeless Tobacco Never   BP Readings from Last 3 Encounters:  07/10/20 122/76  02/29/20 133/87  10/17/19 110/84   Pulse Readings from Last 3 Encounters:  07/10/20 76  10/17/19 77  04/11/19 76  Wt Readings from Last 3 Encounters:  10/09/20 178 lb (80.7 kg)  07/10/20 173 lb 12.8 oz (78.8 kg)  02/20/20 168 lb (76.2 kg)    Assessment: Review of patient past medical history, allergies, medications, health status, including review of consultants reports, laboratory and other test data, was performed as part of comprehensive evaluation and provision of chronic care management services.   SDOH:  (Social Determinants of Health) assessments and interventions performed: none today   CCM Care Plan  Allergies  Allergen Reactions   Trulicity [Dulaglutide] Nausea And Vomiting   Januvia [Sitagliptin] Other (See Comments)    Elbow/Knee/Jaw joint pain, irritability and bloating    Medications Reviewed Today     Reviewed by De Hollingshead, RPH-CPP (Pharmacist) on 10/20/20 at 1418  Med List Status: <None>    Medication Order Taking? Sig Documenting Provider Last Dose Status Informant  acyclovir (ZOVIRAX) 400 MG tablet [id number] No One tablet tid for 5 days.  Patient not taking: Reported on 10/20/2020   Einar Pheasant, MD Not Taking Active   atorvastatin (LIPITOR) 10 MG tablet [id number] Yes TAKE 1 TABLET BY MOUTH EVERY DAY Einar Pheasant, MD Taking Active   Blood Glucose Monitoring Suppl [facility] BLOOD GLUCOSE SYSTEM) DEVI [id number] No Use to test blood sugar up to three times daily - Using [name] System  Patient not taking: Reported on 10/20/2020   Einar Pheasant, MD Not Taking Active   fish oil-omega-3 fatty acids 1000 MG capsule [id number] No Take 1 g by mouth daily.  Patient not taking: Reported on 10/20/2020   [provider] Not Taking Active Self  fluticasone (FLONASE) 50 MCG/ACT nasal spray [id number] Yes Place 2 sprays into both nostrils daily. Einar Pheasant, MD Taking Active   glucose blood (BAYER CONTOUR TEST) test strip [id number] No Use as instructed to test blood sugar three times daily - Using [name] System  Patient not taking: Reported on 10/20/2020   Einar Pheasant, MD Not Taking Active   JARDIANCE 25 MG TABS tablet [id number] Yes TAKE 25 MG BY MOUTH DAILY. Einar Pheasant, MD Taking Active   Lancet Devices ([place] NEXT LANCING DEVICE) [place] [id number] Yes 1 each by Does not apply route daily. Einar Pheasant, MD Taking Active   losartan (COZAAR) 25 MG tablet [id number] Yes TAKE 1 TABLET (25 MG TOTAL) BY MOUTH DAILY. Einar Pheasant, MD Taking Active   metFORMIN (GLUCOPHAGE-XR) 500 MG 24 hr tablet [id number] Yes TAKE 2 TABLETS BY MOUTH TWICE A [place] Fillers, MD Taking Active   [place] LANCETS [place] [id number] Yes 1 each by Does not apply route 3 (three) times daily. Einar Pheasant, MD Taking Active   Multiple Vitamin (MULTIVITAMIN) tablet [id number] Yes Take 1 tablet by mouth daily. [provider] Taking Active Self  nystatin cream (MYCOSTATIN)  [id number] Yes Apply 1 application topically 2 (two) times daily. Einar Pheasant, MD Taking Active   pantoprazole (PROTONIX) 40 MG tablet [id number] Yes TAKE 1 TABLET BY MOUTH EVERY DAY Einar Pheasant, MD Taking Active   venlafaxine XR (EFFEXOR-XR) 37.5 MG 24 hr capsule [id number] Yes TAKE 1 CAPSULE BY MOUTH DAILY WITH BREAKFAST. Einar Pheasant, MD Taking Active             Patient Active Problem List   Diagnosis Date Noted   Hot flashes 02/29/2020   Type 2 diabetes mellitus with hyperglycemia ([place]) 10/28/2019   Menstrual abnormality 10/27/2018   Mass of lower outer quadrant of left breast 08/21/2017   Vaginal irritation 04/05/2015   Obstructive sleep apnea  02/01/2015   Family history of colonic polyps 09/08/2014   Health care maintenance 09/08/2014   History of HPV infection 09/19/2013   GERD (gastroesophageal reflux disease) 01/24/2013   Hypertension 06/12/2012   Hypercholesterolemia 06/12/2012   History of anemia 06/12/2012    Immunization History  Administered Date(s) Administered   Influenza Inj Mdck Quad Pf 03/09/2019   Influenza Split 02/25/2014   Influenza Whole 02/05/2012   Influenza,inj,Quad PF,6+ Mos 02/25/2013, 01/27/2015, 02/16/2018, 03/19/2020   Influenza-Unspecified 03/04/2016, 02/22/2017, 03/09/2019   PFIZER(Purple Top)SARS-COV-2 Vaccination 09/03/2019, 09/26/2019, 03/30/2020   Tdap 02/17/2018   Zoster Recombinat (Shingrix) 03/21/2019, 06/04/2019    Conditions to be addressed/monitored: HTN, HLD, and DMII  Care Plan : Medication Management  Updates made by De Hollingshead, RPH-CPP since 12/01/2020 12:00 AM     Problem: Diabetes, HLD, HTN      Long-Range Goal: Disease Progression Prevention   Start Date: 10/20/2020  This Visit's Progress: On track  Recent Progress: On track  Priority: High  Note:   Current Barriers:  Unable to achieve control of diabetes   Pharmacist Clinical Goal(s):  Over the next 90 days, patient will achieve control of  diabetes as evidenced by A1c  through collaboration with PharmD and provider.   Interventions: 1:1 collaboration with Einar Pheasant, MD regarding development and update of comprehensive plan of care as evidenced by provider attestation and co-signature Inter-disciplinary care team collaboration (see longitudinal plan of care) Comprehensive medication review performed; medication list updated in electronic medical record  SDOH: Part time job, but always busy additionally helping her grandmother. Lives alone.   Diabetes: Uncontrolled; current treatment: metformin XR 1000 mg BID, Jardiance 25 mg daily, Ozempic 10 clicks of 2.68/3.4 mg pen Reports some nausea if her sugar is <70, some queasiness relieved by eating. Rash is no longer present Hx Trulicity: documented as nausea/vomiting  Current glucose readings:   Fasting Pre lunch/supper Post supper  16-Jun 123 121 95  17-Jun   121  18-Jun 111  96  19-Jun 109 96   20-Jun 126 73   21-Jun 128 18 74  22-Jun 102 108 89  23-Jun  91   24-Jun 101  117  25-Jun 100 109   26-Jun 101 92 173  27-Jun 104 67 137  28-Jun 100     109 86 113  Given improvement in glucose readings and symptomatic hypoglycemia, reduce metformin by taking 1000 mg QAM, 500 mg QPM. Moving forward, may be able to reduce metformin to just 1000 mg QAM and combine with Jardiance into Synjardy to reduce pill burden.  Increase Ozempic by 3 clicks (13 clicks once weekly). This dose is still <0.25 mg weekly.   Hypertension: Controlled; current treatment: losartan 25 mg daily;  Current home readings: not checking Will discuss benefit of home monitoring moving forward.   Hyperlipidemia: Uncontrolled; current treatment: atorvastatin 10 mg daily, OTC omega 3 fatty avids  Discussed goal LDL <100. Reviewed importance of adherence.  Previously discussed improved consistent benefit of TG lowering with Vascepa vs OTC omega 3 fatty acids, including CV risk reduction. Patient requested  to start on Ozempic and see how she tolerates, then discuss Vascepa moving forward. Patient also concerned about pill burden. Will revisit at next visit. Could also consider increasing statin intensity first instead.   GERD: Controlled per patient report; current regimen: pantoprazole 40 mg daily Previously recommended to continue current regimen at this time, along with avoidance of trigger foods.   Depression/Anxiety: Moderately well controlled at this time per patient  report; current regimen: venlafaxine 37.5 mg daily Previously recommended to continue current regimen at this time.   Iron Deficiency: Last ferritin level low; current regimen: Ferrex 150 mg every other day; asks if she needs to increase to daily Previously recommended to increase to daily administration.  Patient Goals/Self-Care Activities Over the next 90 days, patient will:  - take medications as prescribed focus on medication adherence check glucose twice daily, document, and provide at future appointments check blood pressure periodically, document, and provide at future appointments target a minimum of 150 minutes of moderate intensity exercise weekly engage in dietary modifications by reducing carbohydrate consumption  Follow Up Plan: Telephone follow up appointment with care management team member scheduled for: ~ 4 weeks       Medication Assistance: None required.  Patient affirms current coverage meets needs.  Patient's [facility] pharmacy [id number] [address]: [phone]: [phone]  Follow Up:  Patient agrees to Care Plan and Follow-up.   Plan: Telephone follow up appointment with care management team member scheduled for:  ~ 4 weeks  Catie TDarnelle Maffucci PharmD, BPioche CKemp MillClinical Pharmacist LOccidental [facility] [phone]

## 2020-12-01 NOTE — Patient Instructions (Signed)
Visit Information   Goals Addressed               This Visit's Progress     Patient Stated     Medication Monitoring (pt-stated)        Patient Goals/Self-Care Activities Over the next 90 days, patient will:  - take medications as prescribed focus on medication adherence check glucose twice daily, document, and provide at future appointments check blood pressure periodically, document, and provide at future appointments target a minimum of 150 minutes of moderate intensity exercise weekly engage in dietary modifications by reducing carbohydrate consumption         Patient verbalizes understanding of instructions provided today and agrees to view in MyChart.    Plan: Telephone follow up appointment with care management team member scheduled for:  ~ 4 weeks  Catie Feliz Beam, PharmD, [place], CPP Clinical Pharmacist [employer] [employer] [phone]

## 2020-12-28 ENCOUNTER — Ambulatory Visit: Payer: BC Managed Care – PPO | Admitting: Pharmacist

## 2020-12-28 DIAGNOSIS — I10 Essential (primary) hypertension: Secondary | ICD-10-CM

## 2020-12-28 DIAGNOSIS — E1165 Type 2 diabetes mellitus with hyperglycemia: Secondary | ICD-10-CM

## 2020-12-28 NOTE — Patient Instructions (Signed)
Visit Information   Goals Addressed               This Visit's Progress     Patient Stated     Medication Monitoring (pt-stated)        Patient Goals/Self-Care Activities Over the next 90 days, patient will:  - take medications as prescribed focus on medication adherence check glucose twice daily, document, and provide at future appointments check blood pressure periodically, document, and provide at future appointments target a minimum of 150 minutes of moderate intensity exercise weekly engage in dietary modifications by reducing carbohydrate consumption         Patient verbalizes understanding of instructions provided today and agrees to view in MyChart.   Plan: Telephone follow up appointment with care management team member scheduled for:  ~ 6 weeks  Catie Feliz Beam, PharmD, [place], CPP Clinical Pharmacist [employer] [employer] [phone]

## 2020-12-28 NOTE — Chronic Care Management (AMB) (Signed)
Care Management   Pharmacy Note  12/28/2020 Name: Anne [name] MRN: [id number] DOB: Nov 02, 1965  Subjective: Anne [name] is a 55 y.o. year old female who is a primary care patient of Dale Walker, MD. The Care Management team was consulted for assistance with care management and care coordination needs.    Engaged with patient by telephone for follow up visit in response to provider referral for pharmacy case management and/or care coordination services.   The patient was given information about Care Management services today including:  Care Management services includes personalized support from designated clinical staff supervised by the patient's primary care provider, including individualized plan of care and coordination with other care providers. 24/7 contact phone numbers for assistance for urgent and routine care needs. The patient may stop case management services at any time by phone call to the office staff.  Patient agreed to services and consent obtained.  Assessment:  Review of patient status, including review of consultants reports, laboratory and other test data, was performed as part of comprehensive evaluation and provision of chronic care management services.   SDOH (Social Determinants of Health) assessments and interventions performed:  SDOH Interventions    Flowsheet Row Most Recent Value  SDOH Interventions   Financial Strain Interventions Intervention Not Indicated        Objective:  Lab Results  Component Value Date   CREATININE 0.73 10/06/2020   CREATININE 0.77 07/21/2020   CREATININE 0.70 07/07/2020    Lab Results  Component Value Date   HGBA1C 7.5 (H) 10/06/2020       Component Value Date/Time   CHOL 200 10/06/2020 0852   TRIG 281.0 (H) 10/06/2020 0852   HDL 50.30 10/06/2020 0852   CHOLHDL 4 10/06/2020 0852   VLDL 56.2 (H) 10/06/2020 0852   LDLCALC 69 04/08/2019 0820   LDLDIRECT 112.0 10/06/2020 0852     Clinical  ASCVD: No  The 10-year ASCVD risk score [name], et al., 2013) is: 3.9%   Values used to calculate the score:     Age: 69 years     Sex: Female     Is Non-Hispanic African American: No     Diabetic: Yes     Tobacco smoker: No     Systolic Blood Pressure: 122 mmHg     Is BP treated: Yes     HDL Cholesterol: 50.3 mg/dL     Total Cholesterol: 200 mg/dL      BP Readings from Last 3 Encounters:  07/10/20 122/76  02/29/20 133/87  10/17/19 110/84    Care Plan  Allergies  Allergen Reactions   Trulicity [Dulaglutide] Nausea And Vomiting   Januvia [Sitagliptin] Other (See Comments)    Elbow/Knee/Jaw joint pain, irritability and bloating    Medications Reviewed Today     Reviewed by Lourena Simmonds, RPH-CPP (Pharmacist) on 10/20/20 at 1418  Med List Status: <None>   Medication Order Taking? Sig Documenting Provider Last Dose Status Informant  acyclovir (ZOVIRAX) 400 MG tablet [id number] No One tablet tid for 5 days.  Patient not taking: Reported on 10/20/2020   Dale Fouke, MD Not Taking Active   atorvastatin (LIPITOR) 10 MG tablet [id number] Yes TAKE 1 TABLET BY MOUTH EVERY DAY Dale [place], MD Taking Active   Blood Glucose Monitoring Suppl [facility] BLOOD GLUCOSE SYSTEM) DEVI [id number] No Use to test blood sugar up to three times daily - Using Garlan Fillers System  Patient not taking: Reported on 10/20/2020   Dale [place], MD Not Taking  Active   fish oil-omega-3 fatty acids 1000 MG capsule [id number] No Take 1 g by mouth daily.  Patient not taking: Reported on 10/20/2020   [provider] Not Taking Active Self  fluticasone (FLONASE) 50 MCG/ACT nasal spray [id number] Yes Place 2 sprays into both nostrils daily. Dale [place], MD Taking Active   glucose blood (BAYER CONTOUR TEST) test strip [id number] No Use as instructed to test blood sugar three times daily - Using Garlan Fillers System  Patient not taking: Reported on 10/20/2020   Dale [place], MD Not  Taking Active   JARDIANCE 25 MG TABS tablet [id number] Yes TAKE 25 MG BY MOUTH DAILY. Dale [place], MD Taking Active   Lancet Devices (MICROLET NEXT LANCING DEVICE) MISC [id number] Yes 1 each by Does not apply route daily. Dale [place], MD Taking Active   losartan (COZAAR) 25 MG tablet [id number] Yes TAKE 1 TABLET (25 MG TOTAL) BY MOUTH DAILY. Dale [place], MD Taking Active   metFORMIN (GLUCOPHAGE-XR) 500 MG 24 hr tablet [id number] Yes TAKE 2 TABLETS BY MOUTH TWICE A [name], MD Taking Active   MICROLET LANCETS MISC [id number] Yes 1 each by Does not apply route 3 (three) times daily. Dale [place], MD Taking Active   Multiple Vitamin (MULTIVITAMIN) tablet [id number] Yes Take 1 tablet by mouth daily. [provider] Taking Active Self  nystatin cream (MYCOSTATIN) [id number] Yes Apply 1 application topically 2 (two) times daily. Dale [place], MD Taking Active   pantoprazole (PROTONIX) 40 MG tablet [id number] Yes TAKE 1 TABLET BY MOUTH EVERY DAY Dale [place], MD Taking Active   venlafaxine XR (EFFEXOR-XR) 37.5 MG 24 hr capsule [id number] Yes TAKE 1 CAPSULE BY MOUTH DAILY WITH BREAKFAST. Dale [place], MD Taking Active             Patient Active Problem List   Diagnosis Date Noted   Hot flashes 02/29/2020   Type 2 diabetes mellitus with hyperglycemia (HCC) 10/28/2019   Menstrual abnormality 10/27/2018   Mass of lower outer quadrant of left breast 08/21/2017   Vaginal irritation 04/05/2015   Obstructive sleep apnea 02/01/2015   Family history of colonic polyps 09/08/2014   Health care maintenance 09/08/2014   History of HPV infection 09/19/2013   GERD (gastroesophageal reflux disease) 01/24/2013   Hypertension 06/12/2012   Hypercholesterolemia 06/12/2012   History of anemia 06/12/2012    Conditions to be addressed/monitored: HTN, HLD, and DMII  Care Plan : Medication Management  Updates made by Lourena Simmonds, RPH-CPP since 12/28/2020 12:00 AM      Problem: Diabetes, HLD, HTN      Long-Range Goal: Disease Progression Prevention   Start Date: 10/20/2020  This Visit's Progress: On track  Recent Progress: On track  Priority: High  Note:   Current Barriers:  Unable to achieve control of diabetes   Pharmacist Clinical Goal(s):  Over the next 90 days, patient will achieve control of diabetes as evidenced by A1c  through collaboration with PharmD and provider.   Interventions: 1:1 collaboration with Dale [place], MD regarding development and update of comprehensive plan of care as evidenced by provider attestation and co-signature Inter-disciplinary care team collaboration (see longitudinal plan of care) Comprehensive medication review performed; medication list updated in electronic medical record  SDOH: Part time job, but always busy additionally helping her grandmother. Lives alone.   Diabetes: Uncontrolled; current treatment: metformin XR 1000 mg QAM, 500 mg QPM, Jardiance 25 mg daily, Ozempic 13 clicks of 0.25/0.5 mg pen Reports some nausea if she needs to  eat, sometimes associated w/ BG <100, otherwise, no concerns with GI upset. Hx Trulicity: documented as nausea/vomiting  Baseline weight: 175, most recent weight: 165 lbs (10 lbs weight gain, ~ 5 % weight loss) Current glucose readings:  Date Fasting Post Prandial  15-Jul  137  14-Jul 87   13-Jul  126  12-Jul 144 147  11-Jul 95 93  10-Jul 126 81  9-Jul 106 116  8-Jul 118   7-Jul 114   AVG 113 117  Current physical activity: yard work, helps with her grandmother's care Given improvement in glucose readings and symptomatic hypoglycemia, reduce metformin to 1000 mg QAM. Continue Jardiance 25 mg daily. Moving forward, can combine metformin XR 1000 mg + Jardiance 25 mg into Synjardy XR 25/1000 mg QAM.  Increase Ozempic by 5 clicks to 0.25 mg weekly dose. Patient advised to reduce dose if more severe GI symptoms recur with this dose increase.  Follow up with PCP in ~  4 weeks as previously scheduled.   Hypertension: Controlled per home readings; current treatment: losartan 25 mg daily;  Current home readings:  Date AM SBP AM DBP PM SBP PM DBP  15-Jul 127 77    14-Jul 128 81    13-Jul 119 85 126 80  12-Jul   121 81  11-Jul 121 76    10-Jul   119 77  9-Jul   115 74  8-Jul      7-Jul      AVG 124 80 120 78  Continue current regimen at this time. Praised for home BP readings.   Hyperlipidemia: Uncontrolled; current treatment: atorvastatin 10 mg daily, OTC omega 3 fatty avids  Discussed goal LDL <100. Reviewed importance of adherence.  Previously discussed improved consistent benefit of TG lowering with Vascepa vs OTC omega 3 fatty acids, including CV risk reduction. Patient requested to start on Ozempic and see how she tolerates, then discuss Vascepa moving forward. Patient also concerned about pill burden. Will revisit at next visit. Could also consider increasing statin intensity first instead.   GERD: Controlled per patient report; current regimen: pantoprazole 40 mg daily Previously recommended to continue current regimen at this time, along with avoidance of trigger foods.   Depression/Anxiety: Moderately well controlled at this time per patient report; current regimen: venlafaxine 37.5 mg daily Previously recommended to continue current regimen at this time.   Iron Deficiency: Last ferritin level low; current regimen: Ferrex 150 mg every other day; asks if she needs to increase to daily Previously recommended to increase to daily administration.  Patient Goals/Self-Care Activities Over the next 90 days, patient will:  - take medications as prescribed focus on medication adherence check glucose twice daily, document, and provide at future appointments check blood pressure periodically, document, and provide at future appointments target a minimum of 150 minutes of moderate intensity exercise weekly engage in dietary modifications by  reducing carbohydrate consumption  Follow Up Plan: Telephone follow up appointment with care management team member scheduled for: ~ 6 weeks (~ 4 weeks after PCP visit)       Medication Assistance:  None required.  Patient affirms current coverage meets needs.  Follow Up:  Patient agrees to Care Plan and Follow-up.  Plan: Telephone follow up appointment with care management team member scheduled for:  ~ 6 weeks  Catie Feliz Beam, PharmD, [place], CPP Clinical Pharmacist [employer] at [employer] [phone]

## 2021-01-11 ENCOUNTER — Encounter: Payer: Self-pay | Admitting: Internal Medicine

## 2021-01-19 ENCOUNTER — Other Ambulatory Visit (INDEPENDENT_AMBULATORY_CARE_PROVIDER_SITE_OTHER): Payer: BC Managed Care – PPO

## 2021-01-19 ENCOUNTER — Other Ambulatory Visit: Payer: Self-pay

## 2021-01-19 DIAGNOSIS — Z862 Personal history of diseases of the blood and blood-forming organs and certain disorders involving the immune mechanism: Secondary | ICD-10-CM | POA: Diagnosis not present

## 2021-01-19 DIAGNOSIS — E1165 Type 2 diabetes mellitus with hyperglycemia: Secondary | ICD-10-CM

## 2021-01-19 DIAGNOSIS — E7800 Pure hypercholesterolemia, unspecified: Secondary | ICD-10-CM | POA: Diagnosis not present

## 2021-01-19 DIAGNOSIS — Z114 Encounter for screening for human immunodeficiency virus [HIV]: Secondary | ICD-10-CM

## 2021-01-19 DIAGNOSIS — Z1159 Encounter for screening for other viral diseases: Secondary | ICD-10-CM

## 2021-01-19 LAB — LIPID PANEL
Cholesterol: 183 mg/dL (ref 0–200)
HDL: 52.8 mg/dL (ref >39.00)
NonHDL: 130.35
Total CHOL/HDL Ratio: 3
Triglycerides: 256 mg/dL — ABNORMAL HIGH (ref 0.0–149.0)
VLDL: 51.2 mg/dL — ABNORMAL HIGH (ref 0.0–40.0)

## 2021-01-19 LAB — CBC WITH DIFFERENTIAL/PLATELET
Basophils Absolute: 0 10*3/uL (ref 0.0–0.1)
Basophils Relative: 0.7 % (ref 0.0–3.0)
Eosinophils Absolute: 0.2 10*3/uL (ref 0.0–0.7)
Eosinophils Relative: 3.3 % (ref 0.0–5.0)
HCT: 44.8 % (ref 36.0–46.0)
Hemoglobin: 14.8 g/dL (ref 12.0–15.0)
Lymphocytes Relative: 35.8 % (ref 12.0–46.0)
Lymphs Abs: 1.9 10*3/uL (ref 0.7–4.0)
MCHC: 33.1 g/dL (ref 30.0–36.0)
MCV: 91 fl (ref 78.0–100.0)
Monocytes Absolute: 0.4 10*3/uL (ref 0.1–1.0)
Monocytes Relative: 7.4 % (ref 3.0–12.0)
Neutro Abs: 2.7 10*3/uL (ref 1.4–7.7)
Neutrophils Relative %: 52.8 % (ref 43.0–77.0)
Platelets: 211 10*3/uL (ref 150.0–400.0)
RBC: 4.92 Mil/uL (ref 3.87–5.11)
RDW: 13.9 % (ref 11.5–15.5)
WBC: 5.2 10*3/uL (ref 4.0–10.5)

## 2021-01-19 LAB — HEPATIC FUNCTION PANEL
ALT: 23 U/L (ref 0–35)
AST: 15 U/L (ref 0–37)
Albumin: 4.3 g/dL (ref 3.5–5.2)
Alkaline Phosphatase: 92 U/L (ref 39–117)
Bilirubin, Direct: 0.1 mg/dL (ref 0.0–0.3)
Total Bilirubin: 0.4 mg/dL (ref 0.2–1.2)
Total Protein: 7.1 g/dL (ref 6.0–8.3)

## 2021-01-19 LAB — BASIC METABOLIC PANEL
BUN: 15 mg/dL (ref 6–23)
CO2: 26 mEq/L (ref 19–32)
Calcium: 9.7 mg/dL (ref 8.4–10.5)
Chloride: 105 mEq/L (ref 96–112)
Creatinine, Ser: 0.76 mg/dL (ref 0.40–1.20)
GFR: 89.69 mL/min (ref >60.00)
Glucose, Bld: 143 mg/dL — ABNORMAL HIGH (ref 70–99)
Potassium: 3.9 mEq/L (ref 3.5–5.1)
Sodium: 141 mEq/L (ref 135–145)

## 2021-01-19 LAB — HEMOGLOBIN A1C: Hgb A1c MFr Bld: 6.6 % — ABNORMAL HIGH (ref 4.6–6.5)

## 2021-01-19 LAB — MICROALBUMIN / CREATININE URINE RATIO
Creatinine,U: 101 mg/dL
Microalb Creat Ratio: 1.1 mg/g (ref 0.0–30.0)
Microalb, Ur: 1.1 mg/dL (ref 0.0–1.9)

## 2021-01-19 LAB — LDL CHOLESTEROL, DIRECT: Direct LDL: 102 mg/dL

## 2021-01-19 LAB — FERRITIN: Ferritin: 9.3 ng/mL — ABNORMAL LOW (ref 10.0–291.0)

## 2021-01-20 LAB — HEPATITIS C ANTIBODY
Hepatitis C Ab: NONREACTIVE
SIGNAL TO CUT-OFF: 0.01 (ref <1.00)

## 2021-01-20 LAB — HIV ANTIBODY (ROUTINE TESTING W REFLEX): HIV 1&2 Ab, 4th Generation: NONREACTIVE

## 2021-01-22 ENCOUNTER — Encounter: Payer: Self-pay | Admitting: Internal Medicine

## 2021-01-22 ENCOUNTER — Telehealth (INDEPENDENT_AMBULATORY_CARE_PROVIDER_SITE_OTHER): Payer: BC Managed Care – PPO | Admitting: Internal Medicine

## 2021-01-22 DIAGNOSIS — E7800 Pure hypercholesterolemia, unspecified: Secondary | ICD-10-CM

## 2021-01-22 DIAGNOSIS — Z862 Personal history of diseases of the blood and blood-forming organs and certain disorders involving the immune mechanism: Secondary | ICD-10-CM

## 2021-01-22 DIAGNOSIS — K219 Gastro-esophageal reflux disease without esophagitis: Secondary | ICD-10-CM | POA: Diagnosis not present

## 2021-01-22 DIAGNOSIS — E1165 Type 2 diabetes mellitus with hyperglycemia: Secondary | ICD-10-CM

## 2021-01-22 DIAGNOSIS — I10 Essential (primary) hypertension: Secondary | ICD-10-CM | POA: Diagnosis not present

## 2021-01-22 DIAGNOSIS — Z8619 Personal history of other infectious and parasitic diseases: Secondary | ICD-10-CM

## 2021-01-22 MED ORDER — ATORVASTATIN CALCIUM 20 MG PO TABS: 20.0000 mg | ORAL_TABLET | Freq: Every day | ORAL | 1 refills | Status: DC

## 2021-01-22 MED ORDER — NYSTATIN 100000 UNIT/GM EX CREA: 1.0000 "application " | TOPICAL_CREAM | Freq: Two times a day (BID) | CUTANEOUS | 0 refills | Status: DC

## 2021-01-22 NOTE — Progress Notes (Signed)
Patient ID: Anne Anne [name], female   DOB: 04/06/1966, 55 y.o.   MRN: [id number]   Virtual Visit via video Note  This visit type was conducted due to national recommendations for restrictions regarding the COVID-19 pandemic (e.g. social distancing).  This format is felt to be most appropriate for this patient at this time.  All issues noted in this document were discussed and addressed.  No physical exam was performed (except for noted visual exam findings with Video Visits).   I connected with Anne Anne [name] by a video enabled telemedicine application and verified that I am speaking with the correct person using two identifiers. Location patient: home Location provider: work  Persons participating in the virtual visit: patient, provider  The limitations, risks, security and privacy concerns of performing an evaluation and management service by video and the availability of in person appointments have been discussed.  It has also been discussed with the patient that there may be a patient responsible charge related to this service. The patient expressed understanding and agreed to proceed.   Reason for visit: scheduled follow up.   HPI: Here to follow up regarding her blood pressures, blood sugars and cholesterol.  She is currently on ozempic.  Just recently increased the dose of Ozempic.  Tolerating.  Has lost weight.  Adjusted diet.  Watching what she eats.  Feels better.  Sugars under much better control.  Recent A1c 6.6.  Staying active.  No chest pain or tightness with increased activity or exertion.  No nausea or vomiting.  No abdominal pain or cramping.  Discussed labs.  Discussed increasing Lipitor.   ROS: Anne [name] pertinent positives and negatives per HPI.  Past Medical History:  Diagnosis Date   Anemia    Diabetes mellitus ([place])    Gastritis    History of abnormal Pap smear    s/p LEEP, h/o HPV   Hypercholesterolemia    Hypertension    IBS (irritable bowel syndrome)      Past Surgical History:  Procedure Laterality Date   APPENDECTOMY     BREAST BIOPSY Left 08/25/2017   left breast biopsy neg   COLONOSCOPY WITH PROPOFOL N/A 11/28/2014   Procedure: COLONOSCOPY WITH PROPOFOL;  Surgeon: Lollie Sails, MD;  Location: [facility] ENDOSCOPY;  Service: Endoscopy;  Laterality: N/A;    Family History  Problem Relation Age of Onset   Diabetes Paternal Grandmother    Emphysema Paternal Grandfather    Breast cancer Sister 70   Colon cancer Neg Hx     SOCIAL HX: reviewed   Current Outpatient Medications:    atorvastatin (LIPITOR) 20 MG tablet, Take 1 tablet (20 mg total) by mouth daily., Disp: 90 tablet, Rfl: 1   fluticasone (FLONASE) 50 MCG/ACT nasal spray, Place 2 sprays into both nostrils daily., Disp: 16 g, Rfl: 6   Insulin Pen Needle (PEN NEEDLES) 32G X 4 MM MISC, Use to inject Ozempic weekly, Disp: 50 each, Rfl: 1   JARDIANCE 25 MG TABS tablet, TAKE 25 MG BY MOUTH DAILY., Disp: 90 tablet, Rfl: 1   Lancet Devices (MICROLET NEXT LANCING DEVICE) MISC, 1 each by Does not apply route daily., Disp: 1 each, Rfl: 0   losartan (COZAAR) 25 MG tablet, TAKE 1 TABLET (25 MG TOTAL) BY MOUTH DAILY., Disp: 90 tablet, Rfl: 1   metFORMIN (GLUCOPHAGE-XR) 500 MG 24 hr tablet, TAKE 2 TABLETS BY MOUTH TWICE A DAY, Disp: 360 tablet, Rfl: 1   MICROLET LANCETS MISC, 1 each by Does not apply route 3 (  three) times daily., Disp: 100 each, Rfl: 11   Multiple Vitamin (MULTIVITAMIN) tablet, Take 1 tablet by mouth daily., Disp: , Rfl:    nystatin cream (MYCOSTATIN), Apply 1 application topically 2 (two) times daily., Disp: 30 g, Rfl: 0   pantoprazole (PROTONIX) 40 MG tablet, TAKE 1 TABLET BY MOUTH EVERY DAY, Disp: 90 tablet, Rfl: 2   Polysaccharide Iron Complex (FERREX 150 PO), Take 1 capsule by mouth every other day., Disp: , Rfl:    Semaglutide,0.25 or 0.5MG/DOS, (OZEMPIC, 0.25 OR 0.5 MG/DOSE,) 2 MG/1.5ML SOPN, Inject 0.25 mg weekly for 4 weeks, then increase to 0.5 mg weekly, Disp:  1.5 mL, Rfl: 2   venlafaxine XR (EFFEXOR-XR) 37.5 MG 24 hr capsule, TAKE 1 CAPSULE BY MOUTH DAILY WITH BREAKFAST., Disp: 90 capsule, Rfl: 1  EXAM:  VITALS per patient if applicable: 160/10  GENERAL: alert, oriented, appears well and in no acute distress  HEENT: atraumatic, conjunttiva clear, no obvious abnormalities on inspection of external nose and ears  NECK: normal movements of the head and neck  LUNGS: on inspection no signs of respiratory distress, breathing rate appears normal, no obvious gross SOB, gasping or wheezing  CV: no obvious cyanosis  PSYCH/NEURO: pleasant and cooperative, no obvious depression or anxiety, speech and thought processing grossly intact  ASSESSMENT AND PLAN:  Discussed the following assessment and plan:  Problem List Items Addressed This Visit     GERD (gastroesophageal reflux disease)    Upper symptoms controlled. On protonix.        History of anemia    Has seen GI.  Last colonoscopy October 2021.  Obtain results.  Follow CBC.  Recent hemoglobin check within normal limits.      Relevant Orders   CBC with Differential/Platelet   Ferritin   History of HPV infection    Status post colposcopy (Dr. Marcelline Mates) 2015.  Pap 11/2016-negative with negative HPV.  Follow-up Pap November 2020 negative with negative HPV.      Hypercholesterolemia    On Lipitor.  Low-cholesterol diet and exercise.  Triglycerides remain elevated.  Improved from last check.  LDL 102.  Would like better control.  Will increase Lipitor to 20 mg daily.  Follow lipid panel liver function test.      Relevant Medications   atorvastatin (LIPITOR) 20 MG tablet   Other Relevant Orders   Hepatic function panel   Lipid panel   Hypertension    Blood pressure as outlined.  Continue losartan.  Follow pressures.  Follow metabolic panel.  Recent GFR within normal limits (89).      Relevant Medications   atorvastatin (LIPITOR) 20 MG tablet   Other Relevant Orders   Basic metabolic  panel   Type 2 diabetes mellitus with hyperglycemia (HCC)    Continues on metformin and Jardiance.  Just increase the Ozempic dose.  Appears to be tolerating.  We will hold on making changes.  She is responded well.  Weight is down.  Overall sugars under much better control.  A1c 6.6.  Continue low-carb diet and exercise.  Follow met b and A1c.      Relevant Medications   atorvastatin (LIPITOR) 20 MG tablet   Other Relevant Orders   Hemoglobin A1c    Return in about 4 months (around 05/24/2021) for physical.   I discussed the assessment and treatment plan with the patient. The patient was provided an opportunity to ask questions and all were answered. The patient agreed with the plan and demonstrated an understanding of the  instructions.   The patient was advised to call back or seek an in-person evaluation if the symptoms worsen or if the condition fails to improve as anticipated.   Einar Pheasant, MD

## 2021-01-22 NOTE — Telephone Encounter (Signed)
Spoke with pt to make that cpe for 05/25/21. She would like to have labs done on 05/21/21 around 9:30 or 10. Please place orders for schedule

## 2021-01-24 ENCOUNTER — Encounter: Payer: Self-pay | Admitting: Internal Medicine

## 2021-01-24 NOTE — Assessment & Plan Note (Signed)
Status post colposcopy (Dr. Valentino Saxon) 2015.  Pap 11/2016-negative with negative HPV.  Follow-up Pap November 2020 negative with negative HPV.

## 2021-01-24 NOTE — Assessment & Plan Note (Signed)
Continues on metformin and Jardiance.  Just increase the Ozempic dose.  Appears to be tolerating.  We will hold on making changes.  She is responded well.  Weight is down.  Overall sugars under much better control.  A1c 6.6.  Continue low-carb diet and exercise.  Follow met b and A1c.

## 2021-01-24 NOTE — Assessment & Plan Note (Signed)
Upper symptoms controlled.  On protonix.  

## 2021-01-24 NOTE — Assessment & Plan Note (Signed)
Has seen GI.  Last colonoscopy October 2021.  Obtain results.  Follow CBC.  Recent hemoglobin check within normal limits.

## 2021-01-24 NOTE — Assessment & Plan Note (Signed)
On Lipitor.  Low-cholesterol diet and exercise.  Triglycerides remain elevated.  Improved from last check.  LDL 102.  Would like better control.  Will increase Lipitor to 20 mg daily.  Follow lipid panel liver function test.

## 2021-01-24 NOTE — Assessment & Plan Note (Signed)
Blood pressure as outlined.  Continue losartan.  Follow pressures.  Follow metabolic panel.  Recent GFR within normal limits (89).

## 2021-02-23 ENCOUNTER — Ambulatory Visit: Payer: BC Managed Care – PPO | Admitting: Pharmacist

## 2021-02-23 DIAGNOSIS — I10 Essential (primary) hypertension: Secondary | ICD-10-CM

## 2021-02-23 DIAGNOSIS — E1165 Type 2 diabetes mellitus with hyperglycemia: Secondary | ICD-10-CM

## 2021-02-23 DIAGNOSIS — E7800 Pure hypercholesterolemia, unspecified: Secondary | ICD-10-CM

## 2021-02-23 MED ORDER — SYNJARDY XR 25-1000 MG PO TB24: 1.0000 | ORAL_TABLET | Freq: Every day | ORAL | 3 refills | Status: DC

## 2021-02-23 MED ORDER — OZEMPIC (0.25 OR 0.5 MG/DOSE) 2 MG/1.5ML ~~LOC~~ SOPN
0.2500 mg | PEN_INJECTOR | SUBCUTANEOUS | 2 refills | Status: DC
Start: 2021-02-23 — End: 2021-11-04

## 2021-02-23 NOTE — Chronic Care Management (AMB) (Signed)
Care Management   Pharmacy Note  02/23/2021 Name: Anne [name] MRN: [id number] DOB: [birth year]  Subjective: Anne [name] is a 55 y.o. year old female who is a primary care patient of Dale Chaska, MD. The Care Management team was consulted for assistance with care management and care coordination needs.    Engaged with patient by telephone for follow up visit in response to provider referral for pharmacy case management and/or care coordination services.   The patient was given information about Care Management services today including:  Care Management services includes personalized support from designated clinical staff supervised by the patient's primary care provider, including individualized plan of care and coordination with other care providers. 24/7 contact phone numbers for assistance for urgent and routine care needs. The patient may stop case management services at any time by phone call to the office staff.  Patient agreed to services and consent obtained.  Assessment:  Review of patient status, including review of consultants reports, laboratory and other test data, was performed as part of comprehensive evaluation and provision of chronic care management services.   SDOH (Social Determinants of Health) assessments and interventions performed:  SDOH Interventions    Flowsheet Row Most Recent Value  SDOH Interventions   Financial Strain Interventions Intervention Not Indicated        Objective:  Lab Results  Component Value Date   CREATININE 0.76 01/19/2021   CREATININE 0.73 10/06/2020   CREATININE 0.77 07/21/2020    Lab Results  Component Value Date   HGBA1C 6.6 (H) 01/19/2021       Component Value Date/Time   CHOL 183 01/19/2021 0924   TRIG 256.0 (H) 01/19/2021 0924   HDL 52.80 01/19/2021 0924   CHOLHDL 3 01/19/2021 0924   VLDL 51.2 (H) 01/19/2021 0924   LDLCALC 69 04/08/2019 0820   LDLDIRECT 102.0 01/19/2021 0924    BP Readings  from Last 3 Encounters:  07/10/20 122/76  02/29/20 133/87  10/17/19 110/84    Care Plan  Allergies  Allergen Reactions   Trulicity [Dulaglutide] Nausea And Vomiting   Januvia [Sitagliptin] Other (See Comments)    Elbow/Knee/Jaw joint pain, irritability and bloating    Medications Reviewed Today     Reviewed by Lourena Simmonds, RPH-CPP (Pharmacist) on 02/23/21 at 1621  Med List Status: <None>   Medication Order Taking? Sig Documenting Provider Last Dose Status Informant  atorvastatin (LIPITOR) 20 MG tablet [id number] Yes Take 1 tablet (20 mg total) by mouth daily. Dale [place], MD Taking Active   fluticasone [facility]) 50 MCG/ACT nasal spray [id number] Yes Place 2 sprays into both nostrils daily. Dale [place], MD Taking Active   JARDIANCE 25 MG TABS tablet [id number] Yes TAKE 25 MG BY MOUTH DAILY. Dale [place], MD Taking Active   Lancet Devices ([place] NEXT LANCING DEVICE) MISC [id number] Yes 1 each by Does not apply route daily. Dale [place], MD Taking Active   losartan (COZAAR) 25 MG tablet [id number] Yes TAKE 1 TABLET (25 MG TOTAL) BY MOUTH DAILY. Dale [place], MD Taking Active   metFORMIN (GLUCOPHAGE-XR) 500 MG 24 hr tablet [id number] Yes TAKE 2 TABLETS BY MOUTH TWICE A [name], MD Taking Active   MICROLET LANCETS MISC [id number] Yes 1 each by Does not apply route 3 (three) times daily. Dale [place], MD Taking Active   Multiple Vitamin (MULTIVITAMIN) tablet [id number] Yes Take 1 tablet by mouth daily. [provider] Taking Active Self  nystatin cream (MYCOSTATIN) [id number] Yes Apply 1 application topically 2 (two) times  daily. Dale [place], MD Taking Active   pantoprazole (PROTONIX) 40 MG tablet [id number] Yes TAKE 1 TABLET BY MOUTH EVERY DAY Dale [place], MD Taking Active   Polysaccharide Iron Complex (FERREX 150 PO) [id number] Yes Take 1 capsule by mouth daily. [provider] Taking Active   Semaglutide,0.25 or 0.5MG /DOS, (OZEMPIC,  0.25 OR 0.5 MG/DOSE,) 2 MG/1.5ML SOPN [id number] Yes Inject 0.25 mg weekly for 4 weeks, then increase to 0.5 mg weekly Dale [place], MD Taking Active            Med Note Lourena Simmonds   Tue Feb 23, 2021  4:21 PM)    venlafaxine XR (EFFEXOR-XR) 37.5 MG 24 hr capsule [id number] Yes TAKE 1 CAPSULE BY MOUTH DAILY WITH BREAKFAST. 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/19/2013   GERD (gastroesophageal reflux disease) 01/24/2013   Hypertension 06/12/2012   Hypercholesterolemia 06/12/2012   History of anemia 06/12/2012    Conditions to be addressed/monitored: HTN, HLD, and DMII  Care Plan : Medication Management  Updates made by Lourena Simmonds, RPH-CPP since 02/23/2021 12:00 AM     Problem: Diabetes, HLD, HTN      Long-Range Goal: Disease Progression Prevention   Start Date: 10/20/2020  This Visit's Progress: On track  Recent Progress: On track  Priority: High  Note:   Current Barriers:  Unable to achieve control of diabetes   Pharmacist Clinical Goal(s):  Over the next 90 days, patient will achieve control of diabetes as evidenced by A1c  through collaboration with PharmD and provider.   Interventions: 1:1 collaboration with Dale , MD regarding development and update of comprehensive plan of care as evidenced by provider attestation and co-signature Inter-disciplinary care team collaboration (see longitudinal plan of care) Comprehensive medication review performed; medication list updated in electronic medical record  SDOH: Part time  job, but always busy additionally helping her grandmother. Lives alone.   Diabetes: Controlled; current treatment: metformin XR 1000 mg QAM, Jardiance 25 mg daily, Ozempic 0.25 mg weekly Reports she is tolerating the current regimen well Hx Trulicity: documented as nausea/vomiting  Baseline weight: 175, most recent weight: 165 lbs  Praised for attainment of goal A1c. Patient requests to change to [facility] as previously discussed for pill burden reduction. Stop metformin XR 1000 mg QAM and Jardiance 25 mg daily, start Synjardy XR 25/1000 mg once daily. Continue Ozempic 0.25 mg weekly.  Continue periodic home BG monitoring.   Hypertension: Controlled per home readings; current treatment: losartan 25 mg daily;  Recommended to continue current regimen at this time  Hyperlipidemia: Uncontrolled; current treatment: atorvastatin 20 mg daily - recently increased by PCP, OTC omega 3 fatty avids  Recommended to continue current regimen at this time  GERD: Controlled per patient report; current regimen: pantoprazole 40 mg daily Previously recommended to continue current regimen at this time, along with avoidance of trigger foods.   Depression/Anxiety: Moderately well controlled at this time per patient report; current regimen: venlafaxine 37.5 mg daily Previously recommended to continue current regimen at this time.  Iron Deficiency: Last ferritin level low; current regimen: Ferrex 150 mg daily Recommended to continue current regimen at this time  Patient Goals/Self-Care Activities Over the next 90 days, patient will:  - take medications as prescribed focus on medication adherence check glucose twice daily, document, and provide at future appointments check blood pressure periodically, document, and provide at future appointments target a minimum of 150 minutes of moderate intensity exercise weekly engage in dietary modifications by reducing carbohydrate consumption  Follow Up Plan:  Telephone follow up appointment with care management team member scheduled for: ~8 weeks      Medication Assistance:  None required.  Patient affirms current coverage meets needs.  Follow Up:  Patient agrees to Care Plan and Follow-up.  Plan: Telephone follow up appointment with care management team member scheduled for:  ~ 8 weeks  Catie Feliz Beam, PharmD, [name], CPP Clinical Pharmacist [facility] at [employer] [facility] [phone]

## 2021-02-23 NOTE — Patient Instructions (Signed)
Visit Information   Goals Addressed               This Visit's Progress     Patient Stated     Medication Monitoring (pt-stated)        Patient Goals/Self-Care Activities Over the next 90 days, patient will:  - take medications as prescribed focus on medication adherence check glucose twice daily, document, and provide at future appointments check blood pressure periodically, document, and provide at future appointments target a minimum of 150 minutes of moderate intensity exercise weekly engage in dietary modifications by reducing carbohydrate consumption        Patient verbalizes understanding of instructions provided today and agrees to view in MyChart.   Plan: Telephone follow up appointment with care management team member scheduled for:  ~ 8 weeks  Catie Feliz Beam, PharmD, [place], CPP Clinical Pharmacist [facility] at [employer] [phone]

## 2021-03-09 LAB — HM DIABETES EYE EXAM

## 2021-03-19 ENCOUNTER — Other Ambulatory Visit: Payer: Self-pay | Admitting: Internal Medicine

## 2021-03-19 DIAGNOSIS — Z1231 Encounter for screening mammogram for malignant neoplasm of breast: Secondary | ICD-10-CM

## 2021-03-30 ENCOUNTER — Encounter: Payer: Self-pay | Admitting: Internal Medicine

## 2021-03-31 MED ORDER — VENLAFAXINE HCL ER 37.5 MG PO CP24: 37.5000 mg | ORAL_CAPSULE | Freq: Every day | ORAL | 1 refills | Status: DC

## 2021-03-31 MED ORDER — LOSARTAN POTASSIUM 25 MG PO TABS: 25.0000 mg | ORAL_TABLET | Freq: Every day | ORAL | 1 refills | Status: DC

## 2021-04-13 ENCOUNTER — Other Ambulatory Visit: Payer: Self-pay

## 2021-04-13 ENCOUNTER — Ambulatory Visit
Admission: RE | Admit: 2021-04-13 | Discharge: 2021-04-13 | Disposition: A | Discharge status: Home / Self Care | Payer: BC Managed Care – PPO | Source: Ambulatory Visit | Attending: Internal Medicine | Admitting: Internal Medicine

## 2021-04-13 DIAGNOSIS — Z1231 Encounter for screening mammogram for malignant neoplasm of breast: Secondary | ICD-10-CM | POA: Insufficient documentation

## 2021-04-23 ENCOUNTER — Encounter: Payer: BC Managed Care – PPO | Admitting: Internal Medicine

## 2021-05-04 ENCOUNTER — Ambulatory Visit: Payer: BC Managed Care – PPO | Admitting: Pharmacist

## 2021-05-04 DIAGNOSIS — E1165 Type 2 diabetes mellitus with hyperglycemia: Secondary | ICD-10-CM

## 2021-05-04 DIAGNOSIS — E7800 Pure hypercholesterolemia, unspecified: Secondary | ICD-10-CM

## 2021-05-04 DIAGNOSIS — I10 Essential (primary) hypertension: Secondary | ICD-10-CM

## 2021-05-04 NOTE — Patient Instructions (Signed)
Visit Information  Following are the goals we discussed today:  Patient Goals/Self-Care Activities Over the next 90 days, patient will:  - take medications as prescribed focus on medication adherence check glucose twice daily, document, and provide at future appointments check blood pressure periodically, document, and provide at future appointments target a minimum of 150 minutes of moderate intensity exercise weekly engage in dietary modifications by reducing carbohydrate consumption        Plan: Goals of care met. Closing CCM case.    Catie Darnelle Maffucci, PharmD, [place], CPP Clinical Pharmacist [place] at [facility] [phone]   Please call the care guide team at [phone] if you need to cancel or reschedule your appointment.   Patient verbalizes understanding of instructions provided today and agrees to view in [place].

## 2021-05-04 NOTE — Chronic Care Management (AMB) (Signed)
Chronic Care Management CCM Pharmacy Note  05/04/2021 Name:  Anne [name] MRN:  [id number] DOB:  08/18/1965  Summary: - Glucose well controlled. Blood pressure well controlled. Tolerating medication well.  Recommendations/Changes made from today's visit: - Continue current regimen at this time.   Subjective: Anne [name] is an 55 y.o. year old female who is a primary patient of Einar Pheasant, MD.  The CCM team was consulted for assistance with disease management and care coordination needs.    Engaged with patient by telephone for follow up visit for pharmacy case management and/or care coordination services.   Objective:  Medications Reviewed Today     Reviewed by De Hollingshead, RPH-CPP (Pharmacist) on 02/23/21 at [place] List Status: <None>   Medication Order Taking? Sig Documenting Provider Last Dose Status Informant  atorvastatin (LIPITOR) 20 MG tablet [id number] Yes Take 1 tablet (20 mg total) by mouth daily. Einar Pheasant, MD Taking Active   fluticasone [facility]) 50 MCG/ACT nasal spray [id number] Yes Place 2 sprays into both nostrils daily. Einar Pheasant, MD Taking Active   JARDIANCE 25 MG TABS tablet [id number] Yes TAKE 25 MG BY MOUTH DAILY. Einar Pheasant, MD Taking Active   Lancet Devices ([place] NEXT LANCING DEVICE) [place] [id number] Yes 1 each by Does not apply route daily. Einar Pheasant, MD Taking Active   losartan (COZAAR) 25 MG tablet [id number] Yes TAKE 1 TABLET (25 MG TOTAL) BY MOUTH DAILY. Einar Pheasant, MD Taking Active   metFORMIN (GLUCOPHAGE-XR) 500 MG 24 hr tablet [id number] Yes TAKE 2 TABLETS BY MOUTH TWICE A [place] Fillers, MD Taking Active   [place] LANCETS [place] [id number] Yes 1 each by Does not apply route 3 (three) times daily. Einar Pheasant, MD Taking Active   Multiple Vitamin (MULTIVITAMIN) tablet [id number] Yes Take 1 tablet by mouth daily. [provider] Taking Active Self  nystatin cream (MYCOSTATIN)  [id number] Yes Apply 1 application topically 2 (two) times daily. Einar Pheasant, MD Taking Active   pantoprazole (PROTONIX) 40 MG tablet [id number] Yes TAKE 1 TABLET BY MOUTH EVERY DAY Einar Pheasant, MD Taking Active   Polysaccharide Iron Complex (FERREX 150 PO) [id number] Yes Take 1 capsule by mouth daily. [provider] Taking Active   Semaglutide,0.25 or 0.5MG/DOS, (OZEMPIC, 0.25 OR 0.5 MG/DOSE,) 2 MG/1.5ML SOPN [id number] Yes Inject 0.25 mg weekly for 4 weeks, then increase to 0.5 mg weekly Einar Pheasant, MD Taking Active            Med Note De Hollingshead   Tue Feb 23, 2021  4:21 PM)    venlafaxine XR (EFFEXOR-XR) 37.5 MG 24 hr capsule [id number] Yes TAKE 1 CAPSULE BY MOUTH DAILY WITH BREAKFAST. Einar Pheasant, MD Taking Active             Pertinent Labs:  Lab Results  Component Value Date   HGBA1C 6.6 (H) 01/19/2021   Lab Results  Component Value Date   CHOL 183 01/19/2021   HDL 52.80 01/19/2021   LDLCALC 69 04/08/2019   LDLDIRECT 102.0 01/19/2021   TRIG 256.0 (H) 01/19/2021   CHOLHDL 3 01/19/2021   Lab Results  Component Value Date   CREATININE 0.76 01/19/2021   BUN 15 01/19/2021   NA 141 01/19/2021   K 3.9 01/19/2021   CL 105 01/19/2021   CO2 26 01/19/2021    SDOH:  (Social Determinants of Health) assessments and interventions performed:  SDOH Interventions    Flowsheet Row Most Recent Value  SDOH Interventions   Financial  Strain Interventions Intervention Not Indicated       CCM Care Plan  Review of patient past medical history, allergies, medications, health status, including review of consultants reports, laboratory and other test data, was performed as part of comprehensive evaluation and provision of chronic care management services.   Care Plan : Medication Management  Updates made by Travis, Catherine E, RPH-CPP since 05/04/2021 12:00 AM     Problem: Diabetes, HLD, HTN      Long-Range Goal: Disease Progression Prevention    Start Date: 10/20/2020  This Visit's Progress: On track  Recent Progress: On track  Priority: High  Note:   Current Barriers:  Unable to achieve control of diabetes   Pharmacist Clinical Goal(s):  Over the next 90 days, patient will achieve control of diabetes as evidenced by A1c  through collaboration with PharmD and provider.   Interventions: 1:1 collaboration with Scott, Charlene, MD regarding development and update of comprehensive plan of care as evidenced by provider attestation and co-signature Inter-disciplinary care team collaboration (see longitudinal plan of care) Comprehensive medication review performed; medication list updated in electronic medical record  SDOH: Part time job, but always busy additionally helping her grandmother. Lives alone.   Diabetes: Controlled; current treatment: Synjardy XR 25/1000 mg daily; Ozempic 0.25 mg weekly Reports she is tolerating the current regimen well. Some constipation, treating with Miralax or colace Hx Trulicity: documented as nausea/vomiting  Baseline weight: 175, most recent weight: 170 lbs (has gained a few pounds back) Current dietary patterns: notes she has "slacked up" on her diet recently Current physical activity: less yard work lately, but plans to start walking on the treadmill.  Current glucose readings: fasting: 90-120; post prandial: 90s-200, though generally <150 Recommended to continue current regimen at this time. Advised to focus on fiber, fluids, increased physical activity to help with constipation.   Hypertension: Controlled per home readings; current treatment: losartan 25 mg daily Current BP readings: averaging 120/70s [place] for goal BP.  Recommended to continue current regimen at this time  Hyperlipidemia: Uncontrolled; current treatment: atorvastatin 20 mg daily - recently increased by PCP, OTC omega 3 fatty avids  Recommended to continue current regimen at this time. Follow up lipids at next  appointment.   GERD: Controlled per patient report; current regimen: pantoprazole 40 mg daily Previously recommended to continue current regimen at this time, along with avoidance of trigger foods.   Depression/Anxiety: Moderately well controlled at this time per patient report; current regimen: venlafaxine 37.5 mg daily Previously recommended to continue current regimen at this time.   Iron Deficiency: Last ferritin level low; current regimen: Ferrex 150 mg daily Recommended to continue current regimen at this time  Patient Goals/Self-Care Activities Over the next 90 days, patient will:  - take medications as prescribed focus on medication adherence check glucose twice daily, document, and provide at future appointments check blood pressure periodically, document, and provide at future appointments target a minimum of 150 minutes of moderate intensity exercise weekly engage in dietary modifications by reducing carbohydrate consumption      Plan: Goals of care met. Closing CCM case.   Catie Travis, PharmD, BCACP, CPP Clinical Pharmacist [facility] at [place] [phone]      

## 2021-05-21 ENCOUNTER — Other Ambulatory Visit (INDEPENDENT_AMBULATORY_CARE_PROVIDER_SITE_OTHER): Payer: BC Managed Care – PPO

## 2021-05-21 ENCOUNTER — Other Ambulatory Visit: Payer: Self-pay

## 2021-05-21 DIAGNOSIS — Z862 Personal history of diseases of the blood and blood-forming organs and certain disorders involving the immune mechanism: Secondary | ICD-10-CM

## 2021-05-21 DIAGNOSIS — I10 Essential (primary) hypertension: Secondary | ICD-10-CM

## 2021-05-21 DIAGNOSIS — E1165 Type 2 diabetes mellitus with hyperglycemia: Secondary | ICD-10-CM | POA: Diagnosis not present

## 2021-05-21 DIAGNOSIS — E7800 Pure hypercholesterolemia, unspecified: Secondary | ICD-10-CM | POA: Diagnosis not present

## 2021-05-21 LAB — CBC WITH DIFFERENTIAL/PLATELET
Basophils Absolute: 0 10*3/uL (ref 0.0–0.1)
Basophils Relative: 0.8 % (ref 0.0–3.0)
Eosinophils Absolute: 0.1 10*3/uL (ref 0.0–0.7)
Eosinophils Relative: 2.9 % (ref 0.0–5.0)
HCT: 44.1 % (ref 36.0–46.0)
Hemoglobin: 14.5 g/dL (ref 12.0–15.0)
Lymphocytes Relative: 38.5 % (ref 12.0–46.0)
Lymphs Abs: 1.6 10*3/uL (ref 0.7–4.0)
MCHC: 33 g/dL (ref 30.0–36.0)
MCV: 91.6 fl (ref 78.0–100.0)
Monocytes Absolute: 0.3 10*3/uL (ref 0.1–1.0)
Monocytes Relative: 7.2 % (ref 3.0–12.0)
Neutro Abs: 2.1 10*3/uL (ref 1.4–7.7)
Neutrophils Relative %: 50.6 % (ref 43.0–77.0)
Platelets: 207 10*3/uL (ref 150.0–400.0)
RBC: 4.81 Mil/uL (ref 3.87–5.11)
RDW: 12.9 % (ref 11.5–15.5)
WBC: 4.1 10*3/uL (ref 4.0–10.5)

## 2021-05-21 LAB — LIPID PANEL
Cholesterol: 182 mg/dL (ref 0–200)
HDL: 50.5 mg/dL (ref >39.00)
NonHDL: 131.91
Total CHOL/HDL Ratio: 4
Triglycerides: 331 mg/dL — ABNORMAL HIGH (ref 0.0–149.0)
VLDL: 66.2 mg/dL — ABNORMAL HIGH (ref 0.0–40.0)

## 2021-05-21 LAB — BASIC METABOLIC PANEL
BUN: 16 mg/dL (ref 6–23)
CO2: 28 mEq/L (ref 19–32)
Calcium: 9.5 mg/dL (ref 8.4–10.5)
Chloride: 102 mEq/L (ref 96–112)
Creatinine, Ser: 0.87 mg/dL (ref 0.40–1.20)
GFR: 76.08 mL/min (ref >60.00)
Glucose, Bld: 146 mg/dL — ABNORMAL HIGH (ref 70–99)
Potassium: 4.3 mEq/L (ref 3.5–5.1)
Sodium: 140 mEq/L (ref 135–145)

## 2021-05-21 LAB — HEPATIC FUNCTION PANEL
ALT: 24 U/L (ref 0–35)
AST: 17 U/L (ref 0–37)
Albumin: 4.2 g/dL (ref 3.5–5.2)
Alkaline Phosphatase: 95 U/L (ref 39–117)
Bilirubin, Direct: 0.1 mg/dL (ref 0.0–0.3)
Total Bilirubin: 0.7 mg/dL (ref 0.2–1.2)
Total Protein: 7 g/dL (ref 6.0–8.3)

## 2021-05-21 LAB — HEMOGLOBIN A1C: Hgb A1c MFr Bld: 6.9 % — ABNORMAL HIGH (ref 4.6–6.5)

## 2021-05-21 LAB — FERRITIN: Ferritin: 10.8 ng/mL (ref 10.0–291.0)

## 2021-05-21 LAB — LDL CHOLESTEROL, DIRECT: Direct LDL: 98 mg/dL

## 2021-05-25 ENCOUNTER — Other Ambulatory Visit (HOSPITAL_COMMUNITY)
Admission: RE | Admit: 2021-05-25 | Discharge: 2021-05-25 | Disposition: A | Discharge status: Home / Self Care | Payer: BC Managed Care – PPO | Source: Ambulatory Visit | Attending: Internal Medicine | Admitting: Internal Medicine

## 2021-05-25 ENCOUNTER — Ambulatory Visit (INDEPENDENT_AMBULATORY_CARE_PROVIDER_SITE_OTHER): Payer: BC Managed Care – PPO | Admitting: Internal Medicine

## 2021-05-25 ENCOUNTER — Encounter: Payer: Self-pay | Admitting: Internal Medicine

## 2021-05-25 ENCOUNTER — Other Ambulatory Visit: Payer: Self-pay

## 2021-05-25 VITALS — BP 124/76 | HR 76 | Temp 97.9°F | Resp 16 | Ht 63.0 in | Wt 176.0 lb

## 2021-05-25 DIAGNOSIS — Z124 Encounter for screening for malignant neoplasm of cervix: Secondary | ICD-10-CM

## 2021-05-25 DIAGNOSIS — I10 Essential (primary) hypertension: Secondary | ICD-10-CM | POA: Diagnosis not present

## 2021-05-25 DIAGNOSIS — E1165 Type 2 diabetes mellitus with hyperglycemia: Secondary | ICD-10-CM

## 2021-05-25 DIAGNOSIS — E7800 Pure hypercholesterolemia, unspecified: Secondary | ICD-10-CM | POA: Diagnosis not present

## 2021-05-25 DIAGNOSIS — Z0000 Encounter for general adult medical examination without abnormal findings: Secondary | ICD-10-CM | POA: Diagnosis not present

## 2021-05-25 DIAGNOSIS — Z862 Personal history of diseases of the blood and blood-forming organs and certain disorders involving the immune mechanism: Secondary | ICD-10-CM

## 2021-05-25 DIAGNOSIS — Z8619 Personal history of other infectious and parasitic diseases: Secondary | ICD-10-CM

## 2021-05-25 DIAGNOSIS — K219 Gastro-esophageal reflux disease without esophagitis: Secondary | ICD-10-CM

## 2021-05-25 NOTE — Progress Notes (Signed)
Patient ID: Anne [name], female   DOB: 02/06/1966, 55 y.o.   MRN: [id number]   Subjective:    Patient ID: Anne [name], female    DOB: [birth year], 55 y.o.   MRN: [id number]  This visit occurred during the SARS-CoV-2 public health emergency.  Safety protocols were in place, including screening questions prior to the visit, additional usage of staff PPE, and extensive cleaning of exam room while observing appropriate contact time as indicated for disinfecting solutions.   Patient here for her physical exam.   Chief Complaint  Patient presents with   Annual Exam   .   HPI Increased stress.  Taking care of her grandmother.  Discussed.  Overall appears to be handling things relatively well.  Tries to stay active.  Works out in her yard for exercise.  Plans to get more serious about diet.  No chest pain or sob reported.  No abdominal pain reported.  Miralax - constipation.     Past Medical History:  Diagnosis Date   Anemia    Diabetes mellitus ([place])    Gastritis    History of abnormal Pap smear    s/p LEEP, h/o HPV   Hypercholesterolemia    Hypertension    IBS (irritable bowel syndrome)    Past Surgical History:  Procedure Laterality Date   APPENDECTOMY     BREAST BIOPSY Left 08/25/2017   left breast biopsy neg   COLONOSCOPY WITH PROPOFOL N/A 11/28/2014   Procedure: COLONOSCOPY WITH PROPOFOL;  Surgeon: Lollie Sails, MD;  [facility]: [facility] ENDOSCOPY;  Service: Endoscopy;  Laterality: N/A;   Family History  Problem Relation Age of Onset   Diabetes Paternal Grandmother    Emphysema Paternal Grandfather    Breast cancer Sister 47   Colon cancer Neg Hx    Social History   Socioeconomic History   Marital status: Single    Spouse name: Not on file   Number of children: 0   Years of education: Not on file   Highest education level: Not on file  Occupational History   Not on file  Tobacco Use   Smoking status: Never   Smokeless tobacco: Never  Substance and  Sexual Activity   Alcohol use: No    Alcohol/week: 0.0 standard drinks   Drug use: No   Sexual activity: Not on file  Other Topics Concern   Not on file  Social History Narrative   Not on file   Social Determinants of Health   Financial Resource Strain: Low Risk    Difficulty of Paying Living Expenses: Not hard at all  Food Insecurity: Not on file  Transportation Needs: Not on file  Physical Activity: Not on file  Stress: Not on file  Social Connections: Not on file     Review of Systems  Constitutional:  Negative for appetite change and unexpected weight change.  HENT:  Negative for congestion, sinus pressure and sore throat.   Eyes:  Negative for pain and visual disturbance.  Respiratory:  Negative for cough, chest tightness and shortness of breath.   Cardiovascular:  Negative for chest pain, palpitations and leg swelling.  Gastrointestinal:  Negative for abdominal pain, diarrhea, nausea and vomiting.  Genitourinary:  Negative for difficulty urinating and dysuria.  Musculoskeletal:  Negative for joint swelling and myalgias.  Skin:  Negative for color change and rash.  Neurological:  Negative for dizziness, light-headedness and headaches.  Hematological:  Negative for adenopathy. Does not bruise/bleed easily.  Psychiatric/Behavioral:  Negative for agitation  and dysphoric mood.       Objective:     BP 124/76    Pulse 76    Temp 97.9 F (36.6 C)    Resp 16    Ht 5' 3" (1.6 m)    Wt 176 lb (79.8 kg)    LMP 03/14/2019 (Approximate)    SpO2 98%    BMI 31.18 kg/m  Wt Readings from Last 3 Encounters:  05/25/21 176 lb (79.8 kg)  01/22/21 165 lb (74.8 kg)  10/09/20 178 lb (80.7 kg)    Physical Exam Vitals reviewed.  Constitutional:      General: She is not in acute distress.    Appearance: Normal appearance. She is well-developed.  HENT:     Head: Normocephalic and atraumatic.     Right Ear: External ear normal.     Left Ear: External ear normal.  Eyes:     General:  No scleral icterus.       Right eye: No discharge.        Left eye: No discharge.     Conjunctiva/sclera: Conjunctivae normal.  Neck:     Thyroid: No thyromegaly.  Cardiovascular:     Rate and Rhythm: Normal rate and regular rhythm.  Pulmonary:     Effort: No tachypnea, accessory muscle usage or respiratory distress.     Breath sounds: Normal breath sounds. No decreased breath sounds, wheezing or rhonchi.  Chest:  Breasts:    Right: No inverted nipple, mass, nipple discharge or tenderness (no axillary adenopathy).     Left: No inverted nipple, mass, nipple discharge or tenderness (no axilarry adenopathy).  Abdominal:     General: Bowel sounds are normal.     Palpations: Abdomen is soft.     Tenderness: There is no abdominal tenderness.  Genitourinary:    Comments: Normal external genitalia.  Vaginal vault without lesions.  Cervix identified.  Pap smear performed.  Could not appreciate any adnexal masses or tenderness.   Musculoskeletal:        General: No swelling or tenderness.     Cervical back: Neck supple.  Lymphadenopathy:     Cervical: No cervical adenopathy.  Skin:    General: Skin is warm.     Findings: No erythema or rash.  Neurological:     Mental Status: She is alert and oriented to person, place, and time.  Psychiatric:        Mood and Affect: Mood normal.        Behavior: Behavior normal.     Outpatient Encounter Medications as of 05/25/2021  Medication Sig   atorvastatin (LIPITOR) 20 MG tablet Take 1 tablet (20 mg total) by mouth daily.   Empagliflozin-metFORMIN HCl ER (SYNJARDY XR) 25-1000 MG TB24 Take 1 tablet by mouth daily.   fluticasone (FLONASE) 50 MCG/ACT nasal spray Place 2 sprays into both nostrils daily.   Lancet Devices (MICROLET NEXT LANCING DEVICE) MISC 1 each by Does not apply route daily.   losartan (COZAAR) 25 MG tablet Take 1 tablet (25 mg total) by mouth daily.   MICROLET LANCETS MISC 1 each by Does not apply route 3 (three) times daily.    Multiple Vitamin (MULTIVITAMIN) tablet Take 1 tablet by mouth daily.   nystatin cream (MYCOSTATIN) Apply 1 application topically 2 (two) times daily.   pantoprazole (PROTONIX) 40 MG tablet TAKE 1 TABLET BY MOUTH EVERY DAY   Polysaccharide Iron Complex (FERREX 150 PO) Take 1 capsule by mouth daily.   Semaglutide,0.25 or 0.5MG/DOS, (OZEMPIC, 0.25  OR 0.5 MG/DOSE,) 2 MG/1.5ML SOPN Inject 0.25 mg into the skin once a week. Inject 0.25 mg weekly for 4 weeks, then increase to 0.5 mg weekly   venlafaxine XR (EFFEXOR-XR) 37.5 MG 24 hr capsule Take 1 capsule (37.5 mg total) by mouth daily with breakfast.   No facility-administered encounter medications on file as of 05/25/2021.     Lab Results  Component Value Date   WBC 4.1 05/21/2021   HGB 14.5 05/21/2021   HCT 44.1 05/21/2021   PLT 207.0 05/21/2021   GLUCOSE 146 (H) 05/21/2021   CHOL 182 05/21/2021   TRIG 331.0 (H) 05/21/2021   HDL 50.50 05/21/2021   LDLDIRECT 98.0 05/21/2021   LDLCALC 69 04/08/2019   ALT 24 05/21/2021   AST 17 05/21/2021   NA 140 05/21/2021   K 4.3 05/21/2021   CL 102 05/21/2021   CREATININE 0.87 05/21/2021   BUN 16 05/21/2021   CO2 28 05/21/2021   TSH 1.22 10/06/2020   HGBA1C 6.9 (H) 05/21/2021   MICROALBUR 1.1 01/19/2021    MM 3D SCREEN BREAST BILATERAL  Result Date: 04/13/2021 CLINICAL DATA:  Screening. EXAM: DIGITAL SCREENING BILATERAL MAMMOGRAM WITH TOMOSYNTHESIS AND CAD TECHNIQUE: Bilateral screening digital craniocaudal and mediolateral oblique mammograms were obtained. Bilateral screening digital breast tomosynthesis was performed. The images were evaluated with computer-aided detection. COMPARISON:  Previous exam(s). ACR Breast Density Category b: There are scattered areas of fibroglandular density. FINDINGS: There are no findings suspicious for malignancy. IMPRESSION: No mammographic evidence of malignancy. A result letter of this screening mammogram will be mailed directly to the patient. RECOMMENDATION:  Screening mammogram in one year. (Code:SM-B-01Y) BI-RADS CATEGORY  1: Negative. Electronically Signed   By: Lillia Mountain M.D.   On: 04/13/2021 16:14      Assessment & Plan:   Problem List Items Addressed This Visit     GERD (gastroesophageal reflux disease)    Upper symptoms controlled. On protonix.        Health care maintenance    Physical today 05/25/21.  PAP 05/25/21.  Mammogram 04/13/21 - Birads I.  Colonoscopy 03/2020.        History of anemia    Check cbc and ferritin with next labs.       Relevant Orders   CBC with Differential/Platelet   Ferritin   History of HPV infection    Status post colposcopy (Dr. Marcelline Mates) 2015.  Pap 11/2016-negative with negative HPV.  Follow-up Pap November 2020 negative with negative HPV. Repeat pap today.       Hypercholesterolemia    On lipitor 50m q day.  Labs reviewed.  Continued low carb diet and exercise.  Follow lipid panel and liver function tests.        Relevant Orders   Hepatic function panel   Lipid panel   TSH   Hypertension    Blood pressure as outlined.  Continue losartan.  Follow pressures.  Follow metabolic panel.  Recent GFR within normal limits (76). .      Relevant Orders   Basic metabolic panel   Type 2 diabetes mellitus [place] hyperglycemia [place]    Continues on synjardy and ozempic.   Appears to be tolerating.  She has responded well.  Overall sugars control - ok.  A1c 6.9.  Continue low-carb diet and exercise.  Follow met b and A1c.      Relevant Orders   Hemoglobin A1c   Other Visit Diagnoses     Routine general medical examination at a health care facility    -  Primary   Cervical cancer screening       Relevant Orders   Cytology - PAP( [place]) (Completed)        Einar Pheasant, MD

## 2021-05-25 NOTE — Assessment & Plan Note (Signed)
On lipitor 20mg q day.  Labs reviewed.  Continued low carb diet and exercise.  Follow lipid panel and liver function tests.   

## 2021-05-25 NOTE — Assessment & Plan Note (Signed)
Physical today 05/25/21.  PAP 05/25/21.  Mammogram 04/13/21 - Birads I.  Colonoscopy 03/2020.

## 2021-05-26 LAB — CYTOLOGY - PAP
Comment: NEGATIVE
Diagnosis: NEGATIVE
High risk HPV: NEGATIVE

## 2021-06-01 ENCOUNTER — Encounter: Payer: Self-pay | Admitting: Internal Medicine

## 2021-06-01 NOTE — Assessment & Plan Note (Signed)
Status post colposcopy (Dr. Valentino Saxon) 2015.  Pap 11/2016-negative with negative HPV.  Follow-up Pap November 2020 negative with negative HPV. Repeat pap today.

## 2021-06-01 NOTE — Assessment & Plan Note (Signed)
Check cbc and ferritin with next labs.   

## 2021-06-01 NOTE — Assessment & Plan Note (Signed)
Continues on synjardy and ozempic.   Appears to be tolerating.  She has responded well.  Overall sugars control - ok.  A1c 6.9.  Continue low-carb diet and exercise.  Follow met b and A1c.

## 2021-06-01 NOTE — Assessment & Plan Note (Signed)
Upper symptoms controlled.  On protonix.  

## 2021-06-01 NOTE — Assessment & Plan Note (Signed)
Blood pressure as outlined.  Continue losartan.  Follow pressures.  Follow metabolic panel.  Recent GFR within normal limits (76). Anne [name]

## 2021-07-22 ENCOUNTER — Other Ambulatory Visit: Payer: Self-pay | Admitting: Internal Medicine

## 2021-08-07 ENCOUNTER — Other Ambulatory Visit: Payer: Self-pay | Admitting: Internal Medicine

## 2021-08-10 ENCOUNTER — Ambulatory Visit: Payer: Self-pay | Admitting: Pharmacist

## 2021-08-10 NOTE — Chronic Care Management (AMB) (Signed)
?  Chronic Care Management  ? ?Note ? ?08/10/2021 ?Name: Anne [name] MRN: [id number] DOB: February 01, 1966 ? ? ? ?Closing pharmacy CCM case at this time. Patient has clinic contact information for future questions or concerns.  ? ?Catie Feliz Beam, PharmD, [place], CPP ?Clinical Pharmacist ?Nature conservation officer at [employer] ?[phone] ? ?

## 2021-08-11 IMAGING — MG MM DIGITAL DIAGNOSTIC UNILAT*L* W/ TOMO W/ CAD
4 series · 4 of 12 positions shown · non-contrast
Comparison: Previous exam(s).

CLINICAL DATA: Screening recall for a possible left breast
distortion.

EXAM:
DIGITAL DIAGNOSTIC UNILATERAL LEFT MAMMOGRAM WITH TOMO AND CAD

[L ML synth-2D]
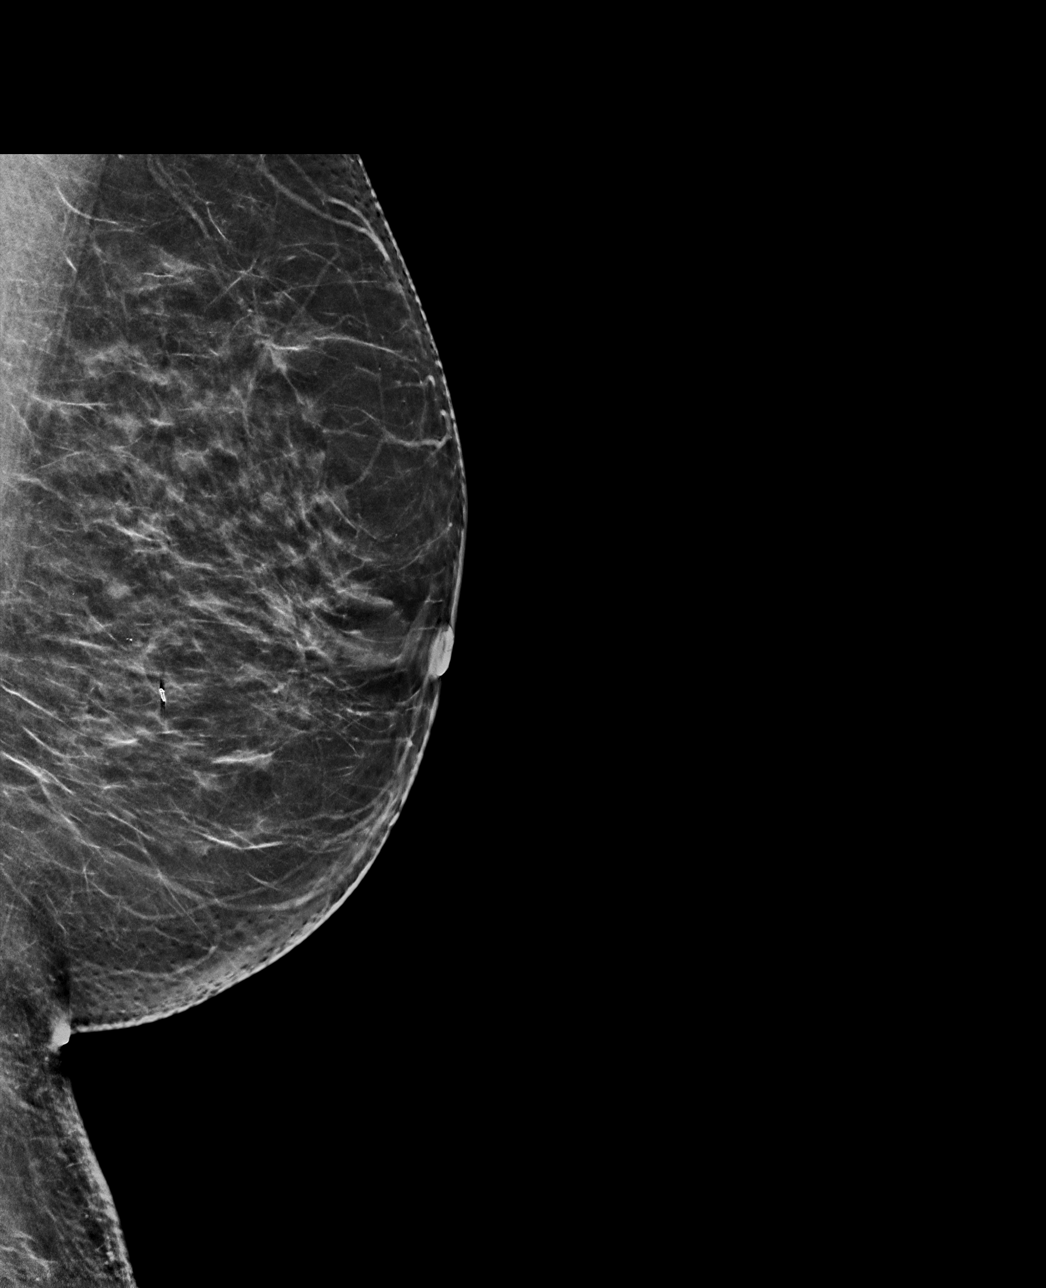

[L CC synth-2D]
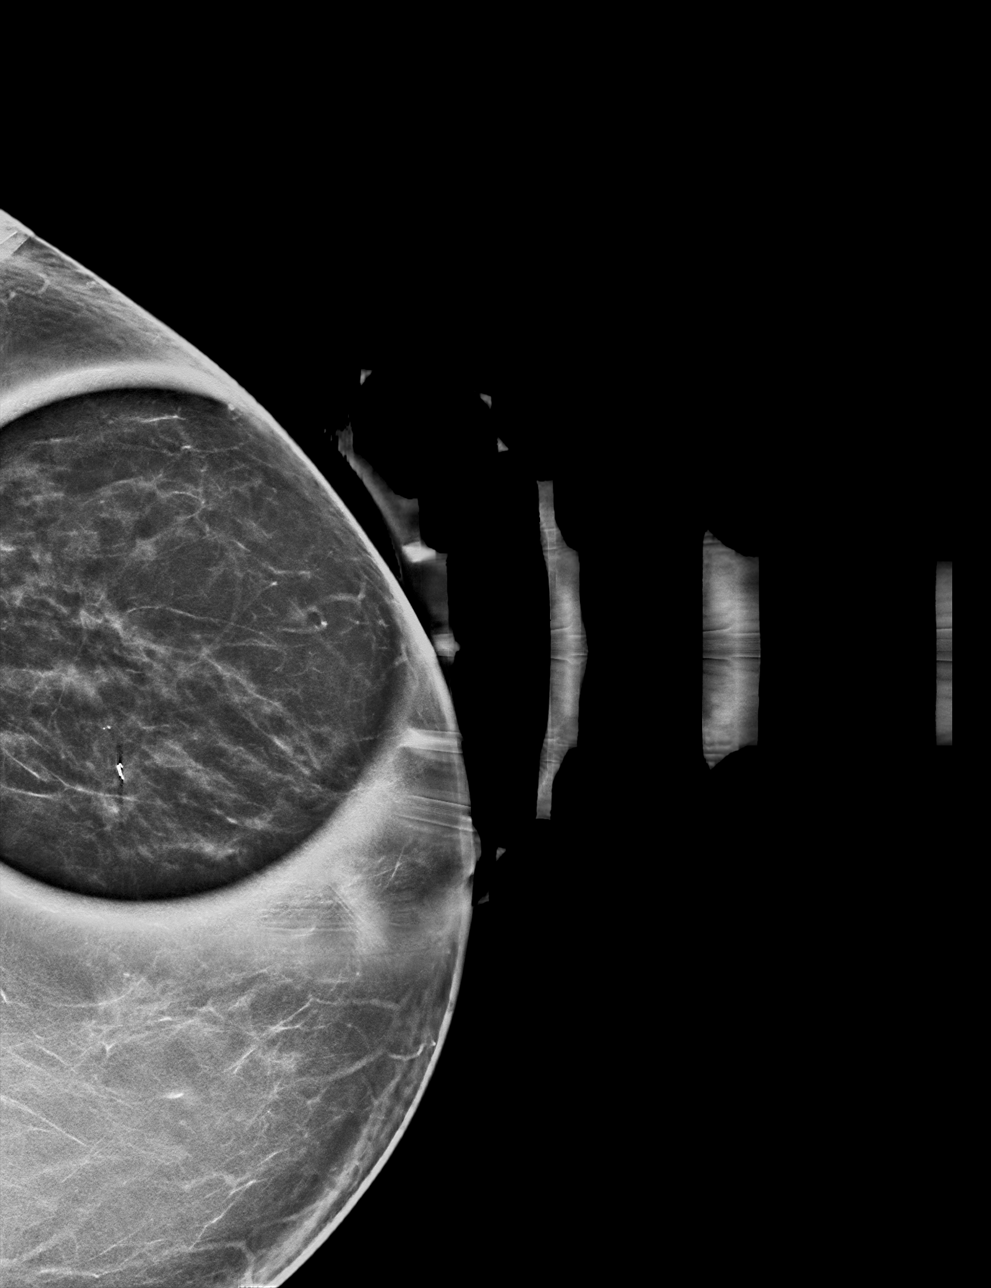

[L CC tomo · tomo slice 34/67.0]
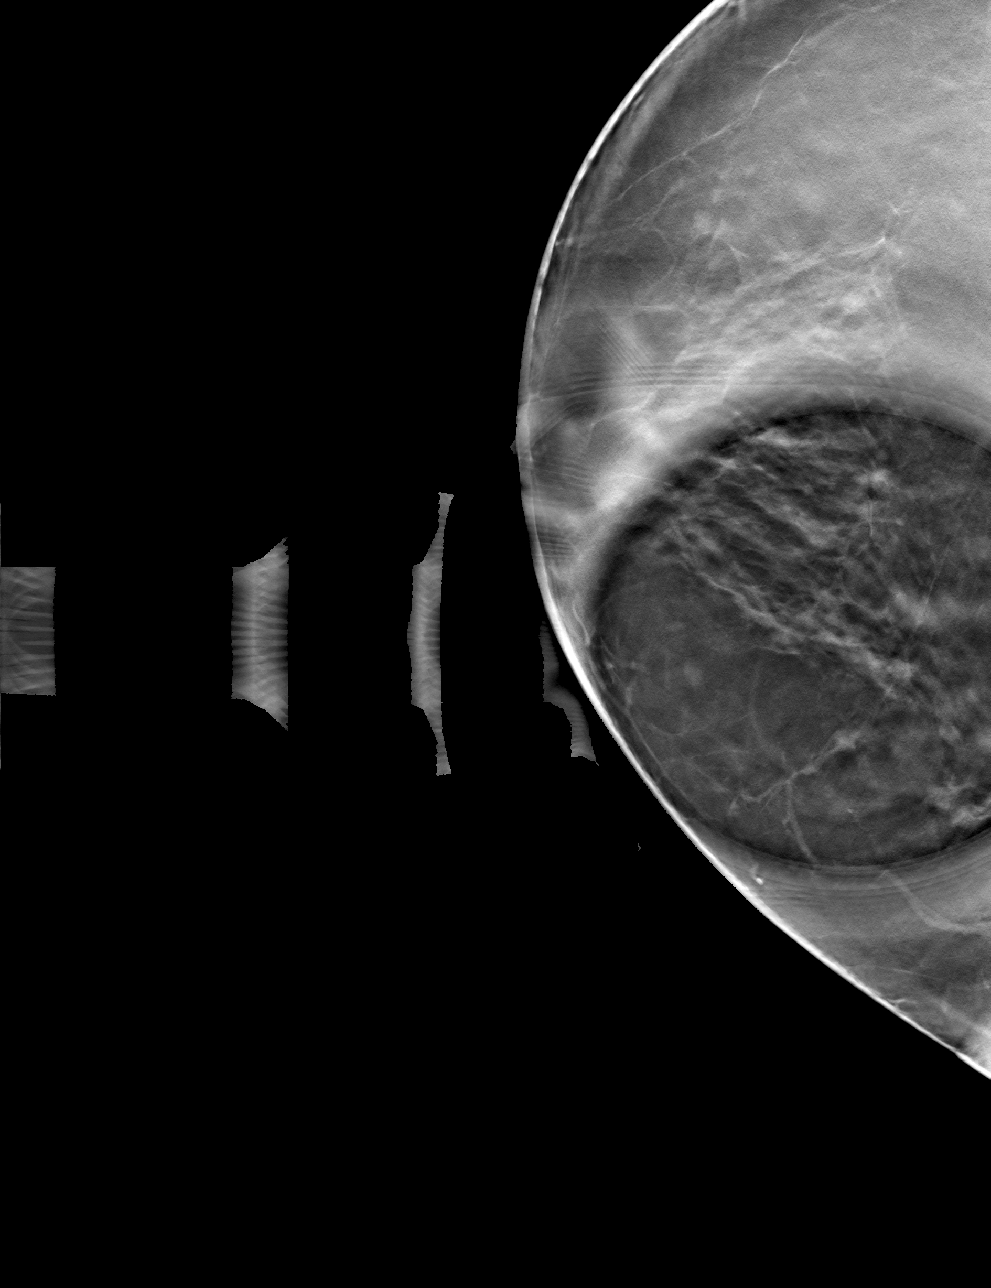

[L ML tomo · tomo slice 35/70.0]
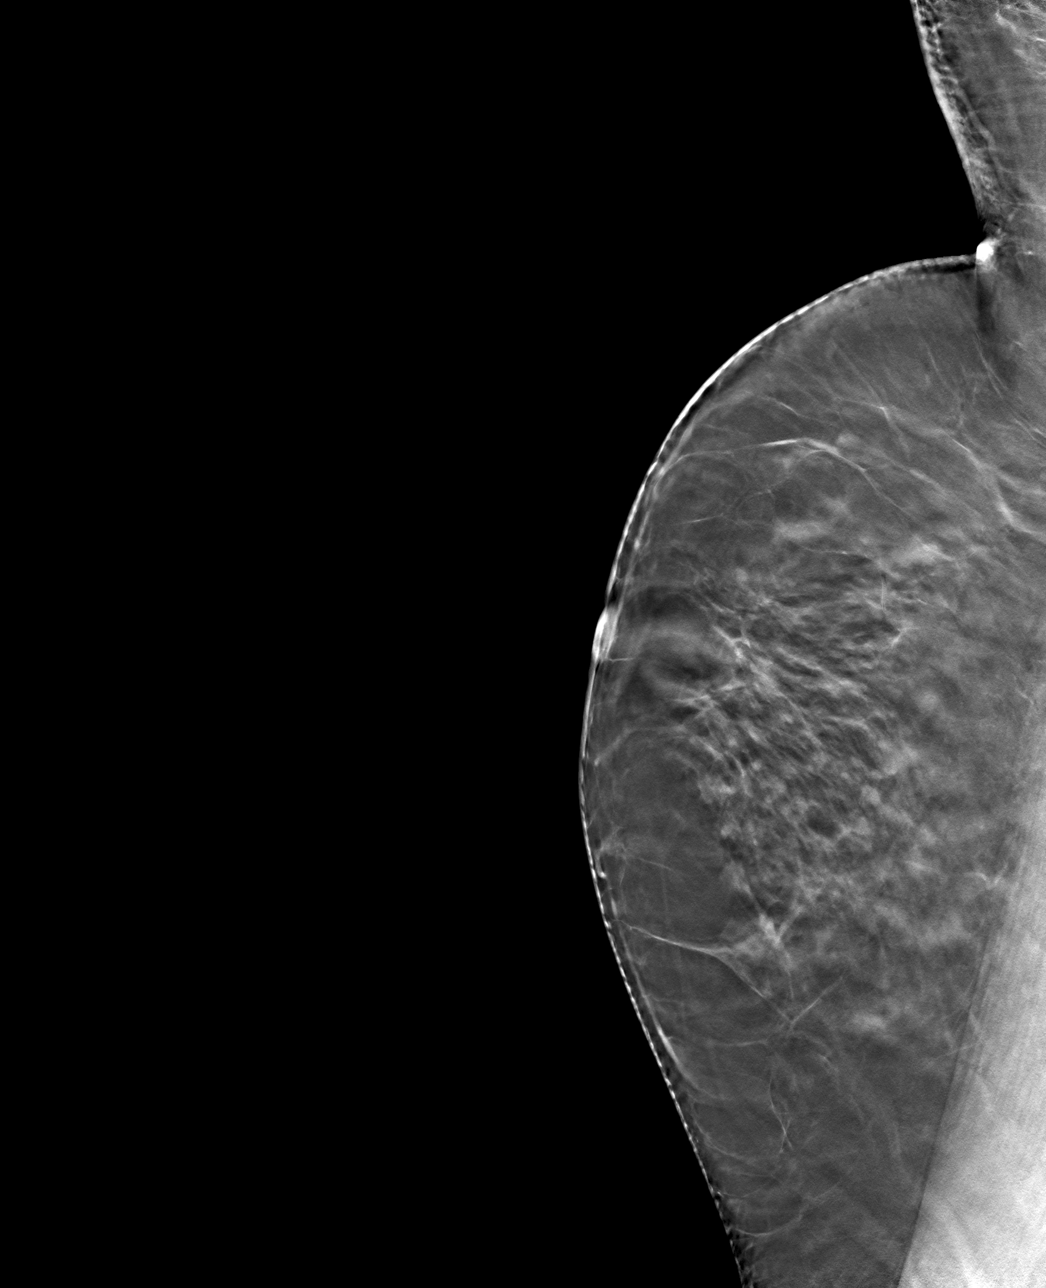

[4 of 12 positions shown; findings below may reference images not displayed]

ACR Breast Density Category c: The breast tissue is heterogeneously
dense, which may obscure small masses.
FINDINGS: The questioned distortion in the lateral left breast resolves on
spot compression tomosynthesis imaging. No correlate for distortion
is seen on the lateral view.

Mammographic images were processed with CAD.
IMPRESSION: Resolution of the questioned left breast distortion consistent with
overlapping fibroglandular tissue.

RECOMMENDATION:
Screening mammogram in one year.(Code:C4-X-SZ3)

I have discussed the findings and recommendations with the patient.
If applicable, a reminder letter will be sent to the patient
regarding the next appointment.

BI-RADS CATEGORY  1: Negative.

## 2021-09-20 ENCOUNTER — Other Ambulatory Visit: Payer: Self-pay | Admitting: Internal Medicine

## 2021-09-24 ENCOUNTER — Other Ambulatory Visit (INDEPENDENT_AMBULATORY_CARE_PROVIDER_SITE_OTHER): Payer: BC Managed Care – PPO

## 2021-09-24 DIAGNOSIS — I10 Essential (primary) hypertension: Secondary | ICD-10-CM | POA: Diagnosis not present

## 2021-09-24 DIAGNOSIS — E7800 Pure hypercholesterolemia, unspecified: Secondary | ICD-10-CM

## 2021-09-24 DIAGNOSIS — E1165 Type 2 diabetes mellitus with hyperglycemia: Secondary | ICD-10-CM | POA: Diagnosis not present

## 2021-09-24 DIAGNOSIS — Z862 Personal history of diseases of the blood and blood-forming organs and certain disorders involving the immune mechanism: Secondary | ICD-10-CM

## 2021-09-24 LAB — CBC WITH DIFFERENTIAL/PLATELET
Basophils Absolute: 0 10*3/uL (ref 0.0–0.1)
Basophils Relative: 1.2 % (ref 0.0–3.0)
Eosinophils Absolute: 0.1 10*3/uL (ref 0.0–0.7)
Eosinophils Relative: 3.3 % (ref 0.0–5.0)
HCT: 43.2 % (ref 36.0–46.0)
Hemoglobin: 14.3 g/dL (ref 12.0–15.0)
Lymphocytes Relative: 47.5 % — ABNORMAL HIGH (ref 12.0–46.0)
Lymphs Abs: 1.7 10*3/uL (ref 0.7–4.0)
MCHC: 33 g/dL (ref 30.0–36.0)
MCV: 93.4 fl (ref 78.0–100.0)
Monocytes Absolute: 0.3 10*3/uL (ref 0.1–1.0)
Monocytes Relative: 8.3 % (ref 3.0–12.0)
Neutro Abs: 1.4 10*3/uL (ref 1.4–7.7)
Neutrophils Relative %: 39.7 % — ABNORMAL LOW (ref 43.0–77.0)
Platelets: 194 10*3/uL (ref 150.0–400.0)
RBC: 4.62 Mil/uL (ref 3.87–5.11)
RDW: 13.6 % (ref 11.5–15.5)
WBC: 3.6 10*3/uL — ABNORMAL LOW (ref 4.0–10.5)

## 2021-09-24 LAB — BASIC METABOLIC PANEL
BUN: 14 mg/dL (ref 6–23)
CO2: 25 mEq/L (ref 19–32)
Calcium: 9.1 mg/dL (ref 8.4–10.5)
Chloride: 106 mEq/L (ref 96–112)
Creatinine, Ser: 0.82 mg/dL (ref 0.40–1.20)
GFR: 81.48 mL/min (ref >60.00)
Glucose, Bld: 150 mg/dL — ABNORMAL HIGH (ref 70–99)
Potassium: 4.1 mEq/L (ref 3.5–5.1)
Sodium: 141 mEq/L (ref 135–145)

## 2021-09-24 LAB — LDL CHOLESTEROL, DIRECT: Direct LDL: 83 mg/dL

## 2021-09-24 LAB — TSH: TSH: 0.74 u[IU]/mL (ref 0.35–5.50)

## 2021-09-24 LAB — LIPID PANEL
Cholesterol: 172 mg/dL (ref 0–200)
HDL: 47.1 mg/dL (ref >39.00)
NonHDL: 124.99
Total CHOL/HDL Ratio: 4
Triglycerides: 346 mg/dL — ABNORMAL HIGH (ref 0.0–149.0)
VLDL: 69.2 mg/dL — ABNORMAL HIGH (ref 0.0–40.0)

## 2021-09-24 LAB — HEPATIC FUNCTION PANEL
ALT: 25 U/L (ref 0–35)
AST: 20 U/L (ref 0–37)
Albumin: 4.1 g/dL (ref 3.5–5.2)
Alkaline Phosphatase: 84 U/L (ref 39–117)
Bilirubin, Direct: 0.1 mg/dL (ref 0.0–0.3)
Total Bilirubin: 0.5 mg/dL (ref 0.2–1.2)
Total Protein: 6.7 g/dL (ref 6.0–8.3)

## 2021-09-24 LAB — HEMOGLOBIN A1C: Hgb A1c MFr Bld: 6.9 % — ABNORMAL HIGH (ref 4.6–6.5)

## 2021-09-24 LAB — FERRITIN: Ferritin: 10.4 ng/mL (ref 10.0–291.0)

## 2021-09-28 ENCOUNTER — Telehealth (INDEPENDENT_AMBULATORY_CARE_PROVIDER_SITE_OTHER): Payer: BC Managed Care – PPO | Admitting: Internal Medicine

## 2021-09-28 VITALS — BP 128/85 | HR 85

## 2021-09-28 DIAGNOSIS — I10 Essential (primary) hypertension: Secondary | ICD-10-CM

## 2021-09-28 DIAGNOSIS — Z862 Personal history of diseases of the blood and blood-forming organs and certain disorders involving the immune mechanism: Secondary | ICD-10-CM

## 2021-09-28 DIAGNOSIS — K219 Gastro-esophageal reflux disease without esophagitis: Secondary | ICD-10-CM

## 2021-09-28 DIAGNOSIS — D72819 Decreased white blood cell count, unspecified: Secondary | ICD-10-CM

## 2021-09-28 DIAGNOSIS — E7800 Pure hypercholesterolemia, unspecified: Secondary | ICD-10-CM

## 2021-09-28 DIAGNOSIS — Z8371 Family history of colonic polyps: Secondary | ICD-10-CM

## 2021-09-28 DIAGNOSIS — E1165 Type 2 diabetes mellitus with hyperglycemia: Secondary | ICD-10-CM | POA: Diagnosis not present

## 2021-09-28 DIAGNOSIS — Z8619 Personal history of other infectious and parasitic diseases: Secondary | ICD-10-CM

## 2021-09-28 NOTE — Progress Notes (Signed)
Patient ID: Anne [name], female   DOB: 03-09-1966, 56 y.o.   MRN: [id number] ? ? ?Virtual Visit via video Note ? ? All issues noted in this document were discussed and addressed. No physical exam was performed (except for noted visual exam findings with Video Visits).  ? ?I connected with Anne [name] today by a video enabled telemedicine application and verified that I am speaking with the correct person using two identifiers. ?Location patient: home ?Location provider: work  ?Persons participating in the virtual visit: patient, provider ? ?The limitations, risks, security and privacy concerns of performing an evaluation and management service by video and the availability of in person appointments have been discussed.  It has also been discussed with the patient that there may be a patient responsible charge related to this service. The patient expressed understanding and agreed to proceed. ? ? ?Reason for visit: follow up appt ? ?HPI: ?Increased stress recently.  Her grandmother is in the hospital.  Hospice involved.  Overall she is handling things well.  Good family support.  Has been out of the routine with eating and exercise.  No chest pain.  Breathing stable.  No cough or congestion.  No sob reported.  No abdominal pain.  Bowels moving.  Taking iron q day.  ? ? ?ROS: See pertinent positives and negatives per HPI. ? ?Past Medical History:  ?Diagnosis Date  ? Anemia   ? Diabetes mellitus ([place])   ? Gastritis   ? History of abnormal Pap smear   ? s/p LEEP, h/o HPV  ? Hypercholesterolemia   ? Hypertension   ? IBS (irritable bowel syndrome)   ? ? ?Past Surgical History:  ?Procedure Laterality Date  ? APPENDECTOMY    ? BREAST BIOPSY Left 08/25/2017  ? left breast biopsy neg  ? COLONOSCOPY WITH PROPOFOL N/A 11/28/2014  ? Procedure: COLONOSCOPY WITH PROPOFOL;  Surgeon: Lollie Sails, MD;  Location: [facility] ENDOSCOPY;  Service: Endoscopy;  Laterality: N/A;  ? ? ?Family History  ?Problem Relation Age of Onset   ? Diabetes Paternal Grandmother   ? Emphysema Paternal Grandfather   ? Breast cancer Sister 56  ? Colon cancer Neg Hx   ? ? ?SOCIAL HX: reviewed.  ? ? ?Current Outpatient Medications:  ?  atorvastatin (LIPITOR) 20 MG tablet, TAKE 1 TABLET BY MOUTH EVERY DAY, Disp: 90 tablet, Rfl: 1 ?  Empagliflozin-metFORMIN HCl ER (SYNJARDY XR) 25-1000 MG TB24, Take 1 tablet by mouth daily., Disp: 90 tablet, Rfl: 3 ?  fluticasone (FLONASE) 50 MCG/ACT nasal spray, Place 2 sprays into both nostrils daily., Disp: 16 g, Rfl: 6 ?  Lancet Devices (MICROLET NEXT LANCING DEVICE) MISC, 1 each by Does not apply route daily., Disp: 1 each, Rfl: 0 ?  losartan (COZAAR) 25 MG tablet, TAKE 1 TABLET (25 MG TOTAL) BY MOUTH DAILY., Disp: 90 tablet, Rfl: 1 ?  MICROLET LANCETS MISC, 1 each by Does not apply route 3 (three) times daily., Disp: 100 each, Rfl: 11 ?  Multiple Vitamin (MULTIVITAMIN) tablet, Take 1 tablet by mouth daily., Disp: , Rfl:  ?  ONETOUCH ULTRA test strip, USE AS INSTRUCTED TO TEST BLOOD SUGAR THREE TIMES DAILY - USING ONETOUCH ULTRA2 SYSTEM, Disp: 100 strip, Rfl: 8 ?  pantoprazole (PROTONIX) 40 MG tablet, TAKE 1 TABLET BY MOUTH EVERY DAY, Disp: 90 tablet, Rfl: 2 ?  Polysaccharide Iron Complex (FERREX 150 PO), Take 1 capsule by mouth daily., Disp: , Rfl:  ?  Semaglutide,0.25 or 0.5MG/DOS, (OZEMPIC, 0.25 OR 0.5 MG/DOSE,)  2 MG/1.5ML SOPN, Inject 0.25 mg into the skin once a week. Inject 0.25 mg weekly for 4 weeks, then increase to 0.5 mg weekly, Disp: 3 mL, Rfl: 2 ?  venlafaxine XR (EFFEXOR-XR) 37.5 MG 24 hr capsule, TAKE 1 CAPSULE BY MOUTH DAILY WITH BREAKFAST., Disp: 90 capsule, Rfl: 1 ?  nystatin cream (MYCOSTATIN), Apply 1 application. topically 2 (two) times daily., Disp: 30 g, Rfl: 0 ? ?EXAM: ? ?GENERAL: alert, oriented, appears well and in no acute distress ? ?HEENT: atraumatic, conjunttiva clear, no obvious abnormalities on inspection of external nose and ears ? ?NECK: normal movements of the head and neck ? ?LUNGS: on  inspection no signs of respiratory distress, breathing rate appears normal, no obvious gross SOB, gasping or wheezing ? ?CV: no obvious cyanosis ? ?PSYCH/NEURO: pleasant and cooperative, no obvious depression or anxiety, speech and thought processing grossly intact ? ?ASSESSMENT AND PLAN: ? ?Discussed the following assessment and plan: ? ?Problem List Items Addressed This Visit   ? ? Family history of colonic polyps  ?   Colonoscopy 03/19/20 - one polyp (hepatic flexure), one 4m polyp (sigmoid colon), diverticulosis  - pathology (polypoid colonic mucosa with benign lymphoid aggregate and polypoid colonic mucosa with mild edema of the lamina propria.  Recommended f/u colonoscopy in 5 years.  ?  ?  ? GERD (gastroesophageal reflux disease)  ?  Upper symptoms controlled. On protonix.   ? ?  ?  ? History of anemia  ?  hgb just checked - 14.  Follow. Ferritin 10.  On iron daily.  ? ?  ?  ? Relevant Orders  ? CBC with Differential/Platelet  ? Ferritin  ? History of HPV infection  ?  Status post colposcopy (Dr. CMarcelline Mates 2015.  Pap 11/2016-negative with negative HPV.  Follow-up Pap November 2020 negative with negative HPV. Repeat 05/25/21 - negative with negative HPV.   ? ?  ?  ? Hypercholesterolemia  ?  On lipitor 255mq day.  Labs reviewed.  Continued low carb diet and exercise.  Follow lipid panel and liver function tests.   ? ?  ?  ? Relevant Orders  ? Hepatic function panel  ? Lipid panel  ? Hypertension  ?  Blood pressure as outlined.  Continue losartan.  Follow pressures.  Follow metabolic panel.  Recent GFR within normal limits (81). . ? ?  ?  ? Relevant Orders  ? Basic metabolic panel  ? Type 2 diabetes mellitus with hyperglycemia ([facility])  ?  Continues on synjardy and ozempic.   Appears to be tolerating.  She has responded well.   A1c 6.9.  Discussed low-carb diet and exercise.  Follow met b and A1c. Hold on making changes in medication.  She has been out of her routine.  Follow. ? ?  ?  ? Relevant Orders  ? Hemoglobin  A1c  ? Microalbumin / creatinine urine ratio  ? ?Other Visit Diagnoses   ? ? Leukopenia, unspecified type    -  Primary  ? Relevant Orders  ? CBC with Differential/Platelet  ? CBC with Differential/Platelet  ? ?  ? ? ?Return in about 4 months (around 01/28/2022) for follow up appt (3050m. ?  ?I discussed the assessment and treatment plan with the patient. The patient was provided an opportunity to ask questions and all were answered. The patient agreed with the plan and demonstrated an understanding of the instructions. ?  ?The patient was advised to call back or seek an in-person evaluation if the  symptoms worsen or if the condition fails to improve as anticipated. ? ? ? ?Einar Pheasant, MD   ?

## 2021-09-29 ENCOUNTER — Telehealth: Payer: Self-pay | Admitting: Internal Medicine

## 2021-09-29 ENCOUNTER — Other Ambulatory Visit: Payer: Self-pay

## 2021-09-29 DIAGNOSIS — E7800 Pure hypercholesterolemia, unspecified: Secondary | ICD-10-CM

## 2021-09-29 DIAGNOSIS — I10 Essential (primary) hypertension: Secondary | ICD-10-CM

## 2021-09-29 DIAGNOSIS — E1165 Type 2 diabetes mellitus with hyperglycemia: Secondary | ICD-10-CM

## 2021-09-29 NOTE — Telephone Encounter (Signed)
error 

## 2021-09-29 NOTE — Telephone Encounter (Signed)
Called Pt... LVMTCB... Pt needs a 4 months follow up and labs prior to follow... ?

## 2021-10-02 ENCOUNTER — Encounter: Payer: Self-pay | Admitting: Internal Medicine

## 2021-10-09 ENCOUNTER — Encounter: Payer: Self-pay | Admitting: Internal Medicine

## 2021-10-09 MED ORDER — NYSTATIN 100000 UNIT/GM EX CREA: 1.0000 "application " | TOPICAL_CREAM | Freq: Two times a day (BID) | CUTANEOUS | 0 refills | Status: DC

## 2021-10-09 NOTE — Assessment & Plan Note (Signed)
Colonoscopy 03/19/20 - one polyp (hepatic flexure), one 3mm polyp (sigmoid colon), diverticulosis  - pathology (polypoid colonic mucosa with benign lymphoid aggregate and polypoid colonic mucosa with mild edema of the lamina propria.  Recommended f/u colonoscopy in 5 years.  

## 2021-10-09 NOTE — Assessment & Plan Note (Signed)
hgb just checked - 14.  Follow. Ferritin 10.  On iron daily.  ?

## 2021-10-09 NOTE — Assessment & Plan Note (Signed)
On lipitor 20mg q day.  Labs reviewed.  Continued low carb diet and exercise.  Follow lipid panel and liver function tests.   

## 2021-10-09 NOTE — Assessment & Plan Note (Signed)
Upper symptoms controlled.  On protonix.  

## 2021-10-09 NOTE — Assessment & Plan Note (Signed)
Status post colposcopy (Dr. Valentino Saxon) 2015.  Pap 11/2016-negative with negative HPV.  Follow-up Pap November 2020 negative with negative HPV. Repeat 05/25/21 - negative with negative HPV.   ?

## 2021-10-09 NOTE — Assessment & Plan Note (Signed)
Blood pressure as outlined.  Continue losartan.  Follow pressures.  Follow metabolic panel.  Recent GFR within normal limits (81). Marland Kitchen ?

## 2021-10-09 NOTE — Assessment & Plan Note (Signed)
Continues on synjardy and ozempic.   Appears to be tolerating.  She has responded well.   A1c 6.9.  Discussed low-carb diet and exercise.  Follow met b and A1c. Hold on making changes in medication.  She has been out of her routine.  Follow. ?

## 2021-10-29 ENCOUNTER — Encounter: Payer: Self-pay | Admitting: Family Medicine

## 2021-10-29 ENCOUNTER — Telehealth (INDEPENDENT_AMBULATORY_CARE_PROVIDER_SITE_OTHER): Payer: BC Managed Care – PPO | Admitting: Family Medicine

## 2021-10-29 DIAGNOSIS — J40 Bronchitis, not specified as acute or chronic: Secondary | ICD-10-CM | POA: Diagnosis not present

## 2021-10-29 MED ORDER — AZITHROMYCIN 250 MG PO TABS: ORAL_TABLET | ORAL | 0 refills | Status: DC

## 2021-10-29 MED ORDER — HYDROCOD POLI-CHLORPHE POLI ER 10-8 MG/5ML PO SUER: 5.0000 mL | Freq: Two times a day (BID) | ORAL | 0 refills | Status: DC | PRN

## 2021-10-29 NOTE — Assessment & Plan Note (Addendum)
The patient likely has bronchitis.  Discussed the possibility of this being a viral illness versus a bacterial illness.  Given that she has had symptoms for about a week with little improvement we will treat with azithromycin.  Discussed treatment of her cough with Tussionex.  Advised on the risk of drowsiness with this.  Advised to discontinue the Tussionex if she is excessively drowsy.  I advised that she could continue the over-the-counter Tylenol.  If she is not improving by early next week she will let us know.  If she has any shortness of breath, cough productive of blood, chest pain, or fever she will be reevaluated in person over the weekend.

## 2021-10-29 NOTE — Progress Notes (Signed)
Virtual Visit via video Note  This visit type was conducted due to national recommendations for restrictions regarding the COVID-19 pandemic (e.g. social distancing).  This format is felt to be most appropriate for this patient at this time.  All issues noted in this document were discussed and addressed.  No physical exam was performed (except for noted visual exam findings with Video Visits).   I connected with Anne [name] today at 12:00 PM EDT by a video enabled telemedicine application and verified that I am speaking with the correct person using two identifiers. Location patient: home Location provider: home office Persons participating in the virtual visit: patient, provider  I discussed the limitations, risks, security and privacy concerns of performing an evaluation and management service by telephone and the availability of in person appointments. I also discussed with the patient that there may be a patient responsible charge related to this service. The patient expressed understanding and agreed to proceed.  Reason for visit: Same-day visit.  HPI: Cough: Patient notes onset of symptoms on 10/23/2021.  She has had a headache and a terrible cough.  Sometimes the cough is productive of clear mucus.  She notes some body aches.  She has been staying home since onset of symptoms.  She notes no congestion or fevers.  She has had some sore throat.  She has not had a little bit of shortness of breath with her cough.  No taste or smell disturbances.  No COVID exposures.  She has not tested herself for COVID.  She has been using over-the-counter Tylenol and also taking Epsom salt baths.   ROS: See pertinent positives and negatives per HPI.  Past Medical History:  Diagnosis Date   Anemia    Diabetes mellitus (HCC)    Gastritis    History of abnormal Pap smear    s/p LEEP, h/o HPV   Hypercholesterolemia    Hypertension    IBS (irritable bowel syndrome)     Past Surgical History:   Procedure Laterality Date   APPENDECTOMY     BREAST BIOPSY Left 08/25/2017   left breast biopsy neg   COLONOSCOPY WITH PROPOFOL N/A 11/28/2014   Procedure: COLONOSCOPY WITH PROPOFOL;  Surgeon: Christena Deem, MD;  Location: [facility] ENDOSCOPY;  Service: Endoscopy;  Laterality: N/A;    Family History  Problem Relation Age of Onset   Diabetes Paternal Grandmother    Emphysema Paternal Grandfather    Breast cancer Sister 46   Colon cancer Neg Hx     SOCIAL HX: Non-smoker   Current Outpatient Medications:    atorvastatin (LIPITOR) 20 MG tablet, TAKE 1 TABLET BY MOUTH EVERY DAY, Disp: 90 tablet, Rfl: 1   azithromycin (ZITHROMAX) 250 MG tablet, Take 2 tablets on day 1, then 1 tablet daily on days 2 through 5, Disp: 6 tablet, Rfl: 0   chlorpheniramine-HYDROcodone (TUSSIONEX PENNKINETIC ER) 10-8 MG/5ML, Take 5 mLs by mouth every 12 (twelve) hours as needed for cough., Disp: 115 mL, Rfl: 0   Empagliflozin-metFORMIN HCl ER (SYNJARDY XR) 25-1000 MG TB24, Take 1 tablet by mouth daily., Disp: 90 tablet, Rfl: 3   fluticasone (FLONASE) 50 MCG/ACT nasal spray, Place 2 sprays into both nostrils daily., Disp: 16 g, Rfl: 6   Lancet Devices (MICROLET NEXT LANCING DEVICE) MISC, 1 each by Does not apply route daily., Disp: 1 each, Rfl: 0   losartan (COZAAR) 25 MG tablet, TAKE 1 TABLET (25 MG TOTAL) BY MOUTH DAILY., Disp: 90 tablet, Rfl: 1   MICROLET LANCETS  MISC, 1 each by Does not apply route 3 (three) times daily., Disp: 100 each, Rfl: 11   Multiple Vitamin (MULTIVITAMIN) tablet, Take 1 tablet by mouth daily., Disp: , Rfl:    nystatin cream (MYCOSTATIN), Apply 1 application. topically 2 (two) times daily., Disp: 30 g, Rfl: 0   ONETOUCH ULTRA test strip, USE AS INSTRUCTED TO TEST BLOOD SUGAR THREE TIMES DAILY - USING ONETOUCH ULTRA2 SYSTEM, Disp: 100 strip, Rfl: 8   pantoprazole (PROTONIX) 40 MG tablet, TAKE 1 TABLET BY MOUTH EVERY DAY, Disp: 90 tablet, Rfl: 2   Polysaccharide Iron Complex (FERREX 150  PO), Take 1 capsule by mouth daily., Disp: , Rfl:    Semaglutide,0.25 or 0.5MG /DOS, (OZEMPIC, 0.25 OR 0.5 MG/DOSE,) 2 MG/1.5ML SOPN, Inject 0.25 mg into the skin once a week. Inject 0.25 mg weekly for 4 weeks, then increase to 0.5 mg weekly, Disp: 3 mL, Rfl: 2   venlafaxine XR (EFFEXOR-XR) 37.5 MG 24 hr capsule, TAKE 1 CAPSULE BY MOUTH DAILY WITH BREAKFAST., Disp: 90 capsule, Rfl: 1  EXAM:  VITALS per patient if applicable:  GENERAL: alert, oriented, appears well and in no acute distress  HEENT: atraumatic, conjunttiva clear, no obvious abnormalities on inspection of external nose and ears  NECK: normal movements of the head and neck  LUNGS: on inspection no signs of respiratory distress, breathing rate appears normal, no obvious gross SOB, gasping or wheezing  CV: no obvious cyanosis  MS: moves all visible extremities without noticeable abnormality  PSYCH/NEURO: pleasant and cooperative, no obvious depression or anxiety, speech and thought processing grossly intact  ASSESSMENT AND PLAN:  Discussed the following assessment and plan:  Problem List Items Addressed This Visit     Bronchitis    The patient likely has bronchitis.  Discussed the possibility of this being a viral illness versus a bacterial illness.  Given that she has had symptoms for about a week with little improvement we will treat with azithromycin.  Discussed treatment of her cough with Tussionex.  Advised on the risk of drowsiness with this.  Advised to discontinue the Tussionex if she is excessively drowsy.  I advised that she could continue the over-the-counter Tylenol.  If she is not improving by early next week she will let us know.  If she has any shortness of breath, cough productive of blood, chest pain, or fever she will be reevaluated in person over the weekend.       Relevant Medications   azithromycin (ZITHROMAX) 250 MG tablet   chlorpheniramine-HYDROcodone (TUSSIONEX PENNKINETIC ER) 10-8 MG/5ML     Return if symptoms worsen or fail to improve.   I discussed the assessment and treatment plan with the patient. The patient was provided an opportunity to ask questions and all were answered. The patient agreed with the plan and demonstrated an understanding of the instructions.   The patient was advised to call back or seek an in-person evaluation if the symptoms worsen or if the condition fails to improve as anticipated.   Marikay Alar, MD

## 2021-11-02 ENCOUNTER — Encounter: Payer: Self-pay | Admitting: Family Medicine

## 2021-11-02 DIAGNOSIS — J40 Bronchitis, not specified as acute or chronic: Secondary | ICD-10-CM

## 2021-11-03 ENCOUNTER — Telehealth (INDEPENDENT_AMBULATORY_CARE_PROVIDER_SITE_OTHER): Payer: BC Managed Care – PPO | Admitting: Family Medicine

## 2021-11-03 ENCOUNTER — Encounter: Payer: Self-pay | Admitting: Family Medicine

## 2021-11-03 ENCOUNTER — Ambulatory Visit (INDEPENDENT_AMBULATORY_CARE_PROVIDER_SITE_OTHER): Payer: BC Managed Care – PPO

## 2021-11-03 VITALS — Ht 63.0 in | Wt 176.0 lb

## 2021-11-03 DIAGNOSIS — J40 Bronchitis, not specified as acute or chronic: Secondary | ICD-10-CM

## 2021-11-03 DIAGNOSIS — R051 Acute cough: Secondary | ICD-10-CM

## 2021-11-03 NOTE — Assessment & Plan Note (Signed)
Symptoms have not significantly improved with azithromycin.  Discussed getting a chest x-ray to evaluate for an underlying pneumonia.  Discussed the potential for an alternative antibiotic depending on what her chest x-ray result reveals.  Discussed she could continue the Tussionex though should not drive while taking it.  She will come in for chest x-ray today.  Discussed it may be tomorrow before the radiologist reads it.  Discussed I would attempt to look at the x-ray this afternoon as well.

## 2021-11-03 NOTE — Progress Notes (Signed)
Virtual Visit via video Note  This visit type was conducted due to national recommendations for restrictions regarding the COVID-19 pandemic (e.g. social distancing).  This format is felt to be most appropriate for this patient at this time.  All issues noted in this document were discussed and addressed.  No physical exam was performed (except for noted visual exam findings with Video Visits).   I connected with Claude Manges today at 11:30 AM EDT by a video enabled telemedicine application and verified that I am speaking with the correct person using two identifiers. Location patient: home Location provider: home office Persons participating in the virtual visit: patient, provider  I discussed the limitations, risks, security and privacy concerns of performing an evaluation and management service by telephone and the availability of in person appointments. I also discussed with the patient that there may be a patient responsible charge related to this service. The patient expressed understanding and agreed to proceed.  Reason for visit: f/u  HPI: Bronchitis: Patient continues to have symptoms.  I saw her last week and she was treated with azithromycin.  She notes she has not had a tremendous amount of improvement.  Cough might be slightly better.  She has mild head congestion.  No fevers though she has had some sweats.  She has some shortness of breath only when coughing.  Some postnasal drip.  No sore throat.  She continues to use Tussionex.   ROS: See pertinent positives and negatives per HPI.  Past Medical History:  Diagnosis Date   Anemia    Diabetes mellitus (HCC)    Gastritis    History of abnormal Pap smear    s/p LEEP, h/o HPV   Hypercholesterolemia    Hypertension    IBS (irritable bowel syndrome)     Past Surgical History:  Procedure Laterality Date   APPENDECTOMY     BREAST BIOPSY Left 08/25/2017   left breast biopsy neg   COLONOSCOPY WITH PROPOFOL N/A 11/28/2014    Procedure: COLONOSCOPY WITH PROPOFOL;  Surgeon: Christena Deem, MD;  Location: [facility] ENDOSCOPY;  Service: Endoscopy;  Laterality: N/A;    Family History  Problem Relation Age of Onset   Diabetes Paternal Grandmother    Emphysema Paternal Grandfather    Breast cancer Sister 7   Colon cancer Neg Hx     SOCIAL HX: Non-smoker   Current Outpatient Medications:    atorvastatin (LIPITOR) 20 MG tablet, TAKE 1 TABLET BY MOUTH EVERY DAY, Disp: 90 tablet, Rfl: 1   chlorpheniramine-HYDROcodone (TUSSIONEX PENNKINETIC ER) 10-8 MG/5ML, Take 5 mLs by mouth every 12 (twelve) hours as needed for cough., Disp: 115 mL, Rfl: 0   Empagliflozin-metFORMIN HCl ER (SYNJARDY XR) 25-1000 MG TB24, Take 1 tablet by mouth daily., Disp: 90 tablet, Rfl: 3   fluticasone (FLONASE) 50 MCG/ACT nasal spray, Place 2 sprays into both nostrils daily., Disp: 16 g, Rfl: 6   Lancet Devices (MICROLET NEXT LANCING DEVICE) MISC, 1 each by Does not apply route daily., Disp: 1 each, Rfl: 0   losartan (COZAAR) 25 MG tablet, TAKE 1 TABLET (25 MG TOTAL) BY MOUTH DAILY., Disp: 90 tablet, Rfl: 1   MICROLET LANCETS MISC, 1 each by Does not apply route 3 (three) times daily., Disp: 100 each, Rfl: 11   Multiple Vitamin (MULTIVITAMIN) tablet, Take 1 tablet by mouth daily., Disp: , Rfl:    nystatin cream (MYCOSTATIN), Apply 1 application. topically 2 (two) times daily., Disp: 30 g, Rfl: 0   ONETOUCH ULTRA test  strip, USE AS INSTRUCTED TO TEST BLOOD SUGAR THREE TIMES DAILY - USING ONETOUCH ULTRA2 SYSTEM, Disp: 100 strip, Rfl: 8   pantoprazole (PROTONIX) 40 MG tablet, TAKE 1 TABLET BY MOUTH EVERY DAY, Disp: 90 tablet, Rfl: 2   Polysaccharide Iron Complex (FERREX 150 PO), Take 1 capsule by mouth daily., Disp: , Rfl:    Semaglutide,0.25 or 0.5MG /DOS, (OZEMPIC, 0.25 OR 0.5 MG/DOSE,) 2 MG/1.5ML SOPN, Inject 0.25 mg into the skin once a week. Inject 0.25 mg weekly for 4 weeks, then increase to 0.5 mg weekly, Disp: 3 mL, Rfl: 2   venlafaxine XR  (EFFEXOR-XR) 37.5 MG 24 hr capsule, TAKE 1 CAPSULE BY MOUTH DAILY WITH BREAKFAST., Disp: 90 capsule, Rfl: 1  EXAM:  VITALS per patient if applicable:  GENERAL: alert, oriented, appears well and in no acute distress  HEENT: atraumatic, conjunttiva clear, no obvious abnormalities on inspection of external nose and ears  NECK: normal movements of the head and neck  LUNGS: on inspection no signs of respiratory distress, breathing rate appears normal, no obvious gross SOB, gasping or wheezing  CV: no obvious cyanosis  MS: moves all visible extremities without noticeable abnormality  PSYCH/NEURO: pleasant and cooperative, no obvious depression or anxiety, speech and thought processing grossly intact  ASSESSMENT AND PLAN:  Discussed the following assessment and plan:  Problem List Items Addressed This Visit     Bronchitis    Symptoms have not significantly improved with azithromycin.  Discussed getting a chest x-ray to evaluate for an underlying pneumonia.  Discussed the potential for an alternative antibiotic depending on what her chest x-ray result reveals.  Discussed she could continue the Tussionex though should not drive while taking it.  She will come in for chest x-ray today.  Discussed it may be tomorrow before the radiologist reads it.  Discussed I would attempt to look at the x-ray this afternoon as well.       Other Visit Diagnoses     Acute cough    -  Primary   Relevant Orders   DG Chest 2 View       Return if symptoms worsen or fail to improve.   I discussed the assessment and treatment plan with the patient. The patient was provided an opportunity to ask questions and all were answered. The patient agreed with the plan and demonstrated an understanding of the instructions.   The patient was advised to call back or seek an in-person evaluation if the symptoms worsen or if the condition fails to improve as anticipated.  Marikay Alar, MD

## 2021-11-04 ENCOUNTER — Other Ambulatory Visit: Payer: Self-pay | Admitting: Internal Medicine

## 2021-11-04 DIAGNOSIS — E1165 Type 2 diabetes mellitus with hyperglycemia: Secondary | ICD-10-CM

## 2021-11-04 MED ORDER — SEMAGLUTIDE(0.25 OR 0.5MG/DOS) 2 MG/3ML ~~LOC~~ SOPN: 0.5000 mg | PEN_INJECTOR | SUBCUTANEOUS | 1 refills | Status: DC

## 2021-11-05 ENCOUNTER — Telehealth: Payer: Self-pay

## 2021-11-05 MED ORDER — DOXYCYCLINE HYCLATE 100 MG PO TABS: 100.0000 mg | ORAL_TABLET | Freq: Two times a day (BID) | ORAL | 0 refills | Status: DC

## 2021-11-05 MED ORDER — HYDROCOD POLI-CHLORPHE POLI ER 10-8 MG/5ML PO SUER: 5.0000 mL | Freq: Two times a day (BID) | ORAL | 0 refills | Status: DC | PRN

## 2021-11-05 NOTE — Telephone Encounter (Signed)
Patient states she is following-up on request for medication before weekend starts.  I spoke with Charlyne Mom, CMA, and she states that patient will receive a call back before they leave today.  I relayed message to patient.

## 2021-11-05 NOTE — Telephone Encounter (Signed)
Noted.  I sent the cough medication and doxycycline to the pharmacy.  If her cough is not improving with the doxycycline she will need to come into the office for an in person visit.

## 2021-11-05 NOTE — Telephone Encounter (Signed)
I called and spoke with the patient and informed  her that the provider sent in a cough medicine and antibiotic, she understood.  Schneider Warchol,cma

## 2021-11-05 NOTE — Telephone Encounter (Signed)
Pt called wanting to know about her chest xray results and antibiotic that she is requesting for her cough

## 2021-11-05 NOTE — Telephone Encounter (Signed)
Patient states she took azithromycin on 10/29/2021, which was the day Dr. Caryl Bis prescribed it for her.  Patient states she took her last dose of azithromycin on 11/02/2021, which means she has been without medication, except for her cough medicine for two and a half days.  Patient states she has been taking her cough medicine only in the evening because she wants to be able to drive to the pharmacy when we put her prescription in.  Patient states the cough medicine is working for her by helping her cough and she is able to sleep better.  Patient states when she met with Dr. Caryl Bis, he told her he was going to give her an antibiotic and cough medicine once he sees the results of her chest x-ray.  Patient states she saw the x-ray results online and she doesn't want to go into the weekend without her medicine, but if we are unable to call it in today, then please call her and let her know the best OTC medicine that she can take.

## 2022-01-24 ENCOUNTER — Other Ambulatory Visit (INDEPENDENT_AMBULATORY_CARE_PROVIDER_SITE_OTHER): Payer: BC Managed Care – PPO

## 2022-01-24 DIAGNOSIS — D72819 Decreased white blood cell count, unspecified: Secondary | ICD-10-CM | POA: Diagnosis not present

## 2022-01-24 DIAGNOSIS — E1165 Type 2 diabetes mellitus with hyperglycemia: Secondary | ICD-10-CM | POA: Diagnosis not present

## 2022-01-24 DIAGNOSIS — E7800 Pure hypercholesterolemia, unspecified: Secondary | ICD-10-CM | POA: Diagnosis not present

## 2022-01-24 DIAGNOSIS — Z862 Personal history of diseases of the blood and blood-forming organs and certain disorders involving the immune mechanism: Secondary | ICD-10-CM | POA: Diagnosis not present

## 2022-01-24 DIAGNOSIS — I10 Essential (primary) hypertension: Secondary | ICD-10-CM

## 2022-01-24 LAB — CBC WITH DIFFERENTIAL/PLATELET
Basophils Absolute: 0 10*3/uL (ref 0.0–0.1)
Basophils Relative: 0.9 % (ref 0.0–3.0)
Eosinophils Absolute: 0.1 10*3/uL (ref 0.0–0.7)
Eosinophils Relative: 2.8 % (ref 0.0–5.0)
HCT: 43.1 % (ref 36.0–46.0)
Hemoglobin: 14.2 g/dL (ref 12.0–15.0)
Lymphocytes Relative: 40.4 % (ref 12.0–46.0)
Lymphs Abs: 1.7 10*3/uL (ref 0.7–4.0)
MCHC: 32.9 g/dL (ref 30.0–36.0)
MCV: 92.9 fl (ref 78.0–100.0)
Monocytes Absolute: 0.3 10*3/uL (ref 0.1–1.0)
Monocytes Relative: 7 % (ref 3.0–12.0)
Neutro Abs: 2.1 10*3/uL (ref 1.4–7.7)
Neutrophils Relative %: 48.9 % (ref 43.0–77.0)
Platelets: 204 10*3/uL (ref 150.0–400.0)
RBC: 4.64 Mil/uL (ref 3.87–5.11)
RDW: 13.5 % (ref 11.5–15.5)
WBC: 4.2 10*3/uL (ref 4.0–10.5)

## 2022-01-24 LAB — HEMOGLOBIN A1C: Hgb A1c MFr Bld: 7.1 % — ABNORMAL HIGH (ref 4.6–6.5)

## 2022-01-24 LAB — BASIC METABOLIC PANEL
BUN: 22 mg/dL (ref 6–23)
CO2: 24 mEq/L (ref 19–32)
Calcium: 9.2 mg/dL (ref 8.4–10.5)
Chloride: 106 mEq/L (ref 96–112)
Creatinine, Ser: 0.77 mg/dL (ref 0.40–1.20)
GFR: 87.67 mL/min (ref >60.00)
Glucose, Bld: 154 mg/dL — ABNORMAL HIGH (ref 70–99)
Potassium: 4.1 mEq/L (ref 3.5–5.1)
Sodium: 139 mEq/L (ref 135–145)

## 2022-01-24 LAB — MICROALBUMIN / CREATININE URINE RATIO
Creatinine,U: 96.7 mg/dL
Microalb Creat Ratio: 2 mg/g (ref 0.0–30.0)
Microalb, Ur: 2 mg/dL — ABNORMAL HIGH (ref 0.0–1.9)

## 2022-01-24 LAB — HEPATIC FUNCTION PANEL
ALT: 22 U/L (ref 0–35)
AST: 18 U/L (ref 0–37)
Albumin: 4 g/dL (ref 3.5–5.2)
Alkaline Phosphatase: 89 U/L (ref 39–117)
Bilirubin, Direct: 0.1 mg/dL (ref 0.0–0.3)
Total Bilirubin: 0.5 mg/dL (ref 0.2–1.2)
Total Protein: 6.7 g/dL (ref 6.0–8.3)

## 2022-01-24 LAB — LIPID PANEL
Cholesterol: 170 mg/dL (ref 0–200)
HDL: 47.9 mg/dL (ref >39.00)
NonHDL: 122.45
Total CHOL/HDL Ratio: 4
Triglycerides: 242 mg/dL — ABNORMAL HIGH (ref 0.0–149.0)
VLDL: 48.4 mg/dL — ABNORMAL HIGH (ref 0.0–40.0)

## 2022-01-24 LAB — LDL CHOLESTEROL, DIRECT: Direct LDL: 87 mg/dL

## 2022-01-24 LAB — FERRITIN: Ferritin: 15 ng/mL (ref 10.0–291.0)

## 2022-01-27 ENCOUNTER — Other Ambulatory Visit: Payer: BC Managed Care – PPO

## 2022-01-31 ENCOUNTER — Encounter: Payer: Self-pay | Admitting: Internal Medicine

## 2022-01-31 ENCOUNTER — Telehealth (INDEPENDENT_AMBULATORY_CARE_PROVIDER_SITE_OTHER): Payer: BC Managed Care – PPO | Admitting: Internal Medicine

## 2022-01-31 ENCOUNTER — Other Ambulatory Visit: Payer: Self-pay | Admitting: Internal Medicine

## 2022-01-31 DIAGNOSIS — E1165 Type 2 diabetes mellitus with hyperglycemia: Secondary | ICD-10-CM

## 2022-01-31 DIAGNOSIS — K219 Gastro-esophageal reflux disease without esophagitis: Secondary | ICD-10-CM | POA: Diagnosis not present

## 2022-01-31 DIAGNOSIS — N898 Other specified noninflammatory disorders of vagina: Secondary | ICD-10-CM

## 2022-01-31 DIAGNOSIS — R232 Flushing: Secondary | ICD-10-CM

## 2022-01-31 DIAGNOSIS — E7800 Pure hypercholesterolemia, unspecified: Secondary | ICD-10-CM

## 2022-01-31 DIAGNOSIS — I10 Essential (primary) hypertension: Secondary | ICD-10-CM

## 2022-01-31 DIAGNOSIS — Z8371 Family history of colonic polyps: Secondary | ICD-10-CM

## 2022-01-31 MED ORDER — METFORMIN HCL ER 500 MG PO TB24: ORAL_TABLET | ORAL | 3 refills | Status: DC

## 2022-01-31 MED ORDER — VENLAFAXINE HCL ER 75 MG PO CP24: 75.0000 mg | ORAL_CAPSULE | Freq: Every day | ORAL | 2 refills | Status: DC

## 2022-01-31 NOTE — Progress Notes (Deleted)
Patient ID: Anne [name], female   DOB: Aug 27, 1965, 56 y.o.   MRN: [id number]   Subjective:    Patient ID: Anne [name], female    DOB: 1966/01/09, 56 y.o.   MRN: [id number]  This visit occurred during the SARS-CoV-2 public health emergency.  Safety protocols were in place, including screening questions prior to the visit, additional usage of staff PPE, and extensive cleaning of exam room while observing appropriate contact time as indicated for disinfecting solutions.   Patient here for  Chief Complaint  Patient presents with  . Follow-up   .   HPI    Past Medical History:  Diagnosis Date  . Anemia   . Diabetes mellitus (HCC)   . Gastritis   . History of abnormal Pap smear    s/p LEEP, h/o HPV  . Hypercholesterolemia   . Hypertension   . IBS (irritable bowel syndrome)    Past Surgical History:  Procedure Laterality Date  . APPENDECTOMY    . BREAST BIOPSY Left 08/25/2017   left breast biopsy neg  . COLONOSCOPY WITH PROPOFOL N/A 11/28/2014   Procedure: COLONOSCOPY WITH PROPOFOL;  Surgeon: Christena Deem, MD;  Location: [facility] ENDOSCOPY;  Service: Endoscopy;  Laterality: N/A;   Family History  Problem Relation Age of Onset  . Diabetes Paternal Grandmother   . Emphysema Paternal Grandfather   . Breast cancer Sister 38  . Colon cancer Neg Hx    Social History   Socioeconomic History  . Marital status: Single    Spouse name: Not on file  . Number of children: 0  . Years of education: Not on file  . Highest education level: Not on file  Occupational History  . Not on file  Tobacco Use  . Smoking status: Never  . Smokeless tobacco: Never  Substance and Sexual Activity  . Alcohol use: No    Alcohol/week: 0.0 standard drinks of alcohol  . Drug use: No  . Sexual activity: Not on file  Other Topics Concern  . Not on file  Social History Narrative  . Not on file   Social Determinants of Health   Financial Resource Strain: Low Risk  (05/04/2021)    Overall Financial Resource Strain (CARDIA)   . Difficulty of Paying Living Expenses: Not hard at all  Food Insecurity: Not on file  Transportation Needs: Not on file  Physical Activity: Not on file  Stress: Not on file  Social Connections: Not on file     Review of Systems     Objective:     Ht 5\' 3"  (1.6 m)   Wt 175 lb (79.4 kg)   LMP 03/14/2019 (Approximate)   BMI 31.00 kg/m  Wt Readings from Last 3 Encounters:  01/31/22 175 lb (79.4 kg)  11/03/21 176 lb (79.8 kg)  10/29/21 176 lb (79.8 kg)    Physical Exam   Outpatient Encounter Medications as of 01/31/2022  Medication Sig  . atorvastatin (LIPITOR) 20 MG tablet TAKE 1 TABLET BY MOUTH EVERY DAY  . Empagliflozin-metFORMIN HCl ER (SYNJARDY XR) 25-1000 MG TB24 Take 1 tablet by mouth daily.  . fluticasone (FLONASE) 50 MCG/ACT nasal spray Place 2 sprays into both nostrils daily.  02/02/2022 Devices (MICROLET NEXT LANCING DEVICE) MISC 1 each by Does not apply route daily.  Demetra Shiner losartan (COZAAR) 25 MG tablet TAKE 1 TABLET (25 MG TOTAL) BY MOUTH DAILY.  Marland Kitchen MICROLET LANCETS MISC 1 each by Does not apply route 3 (three) times daily.  . Multiple Vitamin (  MULTIVITAMIN) tablet Take 1 tablet by mouth daily.  Marland Kitchen nystatin cream (MYCOSTATIN) Apply 1 application. topically 2 (two) times daily.  Letta Pate ULTRA test strip USE AS INSTRUCTED TO TEST BLOOD SUGAR THREE TIMES DAILY - USING ONETOUCH ULTRA2 SYSTEM  . pantoprazole (PROTONIX) 40 MG tablet TAKE 1 TABLET BY MOUTH EVERY DAY  . Polysaccharide Iron Complex (FERREX 150 PO) Take 1 capsule by mouth daily.  . Semaglutide,0.25 or 0.5MG /DOS, 2 MG/3ML SOPN Inject 0.5 mg into the skin once a week. (Patient taking differently: Inject 0.25 mg into the skin once a week.)  . venlafaxine XR (EFFEXOR-XR) 37.5 MG 24 hr capsule TAKE 1 CAPSULE BY MOUTH DAILY WITH BREAKFAST.  . [DISCONTINUED] chlorpheniramine-HYDROcodone (TUSSIONEX PENNKINETIC ER) 10-8 MG/5ML Take 5 mLs by mouth every 12 (twelve) hours as  needed for cough. (Patient not taking: Reported on 01/31/2022)  . [DISCONTINUED] doxycycline (VIBRA-TABS) 100 MG tablet Take 1 tablet (100 mg total) by mouth 2 (two) times daily. (Patient not taking: Reported on 01/31/2022)   No facility-administered encounter medications on file as of 01/31/2022.     Lab Results  Component Value Date   WBC 4.2 01/24/2022   HGB 14.2 01/24/2022   HCT 43.1 01/24/2022   PLT 204.0 01/24/2022   GLUCOSE 154 (H) 01/24/2022   CHOL 170 01/24/2022   TRIG 242.0 (H) 01/24/2022   HDL 47.90 01/24/2022   LDLDIRECT 87.0 01/24/2022   LDLCALC 69 04/08/2019   ALT 22 01/24/2022   AST 18 01/24/2022   NA 139 01/24/2022   K 4.1 01/24/2022   CL 106 01/24/2022   CREATININE 0.77 01/24/2022   BUN 22 01/24/2022   CO2 24 01/24/2022   TSH 0.74 09/24/2021   HGBA1C 7.1 (H) 01/24/2022   MICROALBUR 2.0 (H) 01/24/2022       Assessment & Plan:   Problem List Items Addressed This Visit   None    Dale [place], MD

## 2022-02-01 ENCOUNTER — Other Ambulatory Visit: Payer: Self-pay | Admitting: Family

## 2022-02-01 ENCOUNTER — Other Ambulatory Visit: Payer: Self-pay | Admitting: Internal Medicine

## 2022-02-01 ENCOUNTER — Encounter: Payer: Self-pay | Admitting: Internal Medicine

## 2022-02-02 ENCOUNTER — Other Ambulatory Visit: Payer: Self-pay

## 2022-02-02 MED ORDER — NYSTATIN 100000 UNIT/GM EX CREA: TOPICAL_CREAM | Freq: Two times a day (BID) | CUTANEOUS | 0 refills | Status: DC

## 2022-02-02 MED ORDER — ATORVASTATIN CALCIUM 20 MG PO TABS: 20.0000 mg | ORAL_TABLET | Freq: Every day | ORAL | 1 refills | Status: DC

## 2022-02-06 ENCOUNTER — Encounter: Payer: Self-pay | Admitting: Internal Medicine

## 2022-02-07 ENCOUNTER — Encounter: Payer: Self-pay | Admitting: Internal Medicine

## 2022-02-07 NOTE — Assessment & Plan Note (Signed)
Continues on synjardy and ozempic.  Reported persistent irritation vulva - concern regarding persistent yeast infection.  Will change synjardy to metformin 541m two bid and continue ozempic.  Will stop jardiance and see if symptoms resolve.   Discussed low-carb diet and exercise.  Follow met b and A1c.

## 2022-02-07 NOTE — Progress Notes (Signed)
Patient ID: Anne Anne [name], female   DOB: 1965-12-01, 56 y.o.   MRN: [id number]   Virtual Visit via video Note  All issues noted in this document were discussed and addressed.  No physical exam was performed (except for noted visual exam findings with Video Visits).   I connected with Anne Anne [name] by a video enabled telemedicine application and verified that I am speaking with the correct person using two identifiers. Location patient: home Location provider: work  Persons participating in the virtual visit: patient, provider  The limitations, risks, security and privacy concerns of performing an evaluation and management service by video and the availability of in person appointments have been discussed.  It has also been discussed with the patient that there may be a patient responsible charge related to this service. The patient expressed understanding and agreed to proceed.   Reason for visit: follow up appt  HPI: Follow up regarding her blood sugar, cholesterol and blood pressure.  Few concerns. Noticed ankle swelling - walking/beach.  Discussed effexor - hot flashes.  Having issues with vulva/vaginal itching - yeast infection.  Discussed ozempic.  Some constipation.  Discussed my concern that jardiance may be contributing to the persistent yeast infection.  Eating.  No chest pain or sob reported.  No abdominal pain.     ROS: Anne [name] pertinent positives and negatives per HPI.  Past Medical History:  Diagnosis Date   Anemia    Diabetes mellitus ([place])    Gastritis    History of abnormal Pap smear    s/p LEEP, h/o HPV   Hypercholesterolemia    Hypertension    IBS (irritable bowel syndrome)     Past Surgical History:  Procedure Laterality Date   APPENDECTOMY     BREAST BIOPSY Left 08/25/2017   left breast biopsy neg   COLONOSCOPY WITH PROPOFOL N/A 11/28/2014   Procedure: COLONOSCOPY WITH PROPOFOL;  Surgeon: Lollie Sails, MD;  Location: [facility] ENDOSCOPY;  Service:  Endoscopy;  Laterality: N/A;    Family History  Problem Relation Age of Onset   Diabetes Paternal Grandmother    Emphysema Paternal Grandfather    Breast cancer Sister 72   Colon cancer Neg Hx     SOCIAL HX: reviewed.    Current Outpatient Medications:    fluticasone (FLONASE) 50 MCG/ACT nasal spray, Place 2 sprays into both nostrils daily., Disp: 16 g, Rfl: 6   Lancet Devices (MICROLET NEXT LANCING DEVICE) MISC, 1 each by Does not apply route daily., Disp: 1 each, Rfl: 0   losartan (COZAAR) 25 MG tablet, TAKE 1 TABLET (25 MG TOTAL) BY MOUTH DAILY., Disp: 90 tablet, Rfl: 1   metFORMIN (GLUCOPHAGE-XR) 500 MG 24 hr tablet, Two tablets with breakfast and two tablets with your evening meal, Disp: 120 tablet, Rfl: 3   MICROLET LANCETS MISC, 1 each by Does not apply route 3 (three) times daily., Disp: 100 each, Rfl: 11   Multiple Vitamin (MULTIVITAMIN) tablet, Take 1 tablet by mouth daily., Disp: , Rfl:    ONETOUCH ULTRA test strip, USE AS INSTRUCTED TO TEST BLOOD SUGAR THREE TIMES DAILY - USING ONETOUCH ULTRA2 SYSTEM, Disp: 100 strip, Rfl: 8   pantoprazole (PROTONIX) 40 MG tablet, TAKE 1 TABLET BY MOUTH EVERY DAY, Disp: 90 tablet, Rfl: 2   Polysaccharide Iron Complex (FERREX 150 PO), Take 1 capsule by mouth daily., Disp: , Rfl:    Semaglutide,0.25 or 0.5MG/DOS, 2 MG/3ML SOPN, Inject 0.5 mg into the skin once a week. (Patient taking differently: Inject 0.25  mg into the skin once a week.), Disp: 3 mL, Rfl: 1   venlafaxine XR (EFFEXOR XR) 75 MG 24 hr capsule, Take 1 capsule (75 mg total) by mouth daily with breakfast., Disp: 30 capsule, Rfl: 2   atorvastatin (LIPITOR) 20 MG tablet, Take 1 tablet (20 mg total) by mouth daily., Disp: 90 tablet, Rfl: 1   nystatin cream (MYCOSTATIN), Apply topically 2 (two) times daily., Disp: 30 g, Rfl: 0  EXAM:  GENERAL: alert, oriented, appears well and in no acute distress  HEENT: atraumatic, conjunttiva clear, no obvious abnormalities on inspection of  external nose and ears  NECK: normal movements of the head and neck  LUNGS: on inspection no signs of respiratory distress, breathing rate appears normal, no obvious gross SOB, gasping or wheezing  CV: no obvious cyanosis  PSYCH/NEURO: pleasant and cooperative, no obvious depression or anxiety, speech and thought processing grossly intact  ASSESSMENT AND PLAN:  Discussed the following assessment and plan:  Problem List Items Addressed This Visit     Family history of colonic polyps     Colonoscopy 03/19/20 - one polyp (hepatic flexure), one 16m polyp (sigmoid colon), diverticulosis  - pathology (polypoid colonic mucosa with benign lymphoid aggregate and polypoid colonic mucosa with mild edema of the lamina propria.  Recommended f/u colonoscopy in 5 years.       GERD (gastroesophageal reflux disease)    Upper symptoms controlled. On protonix.        Hot flashes    Discussed.  Increase effexor to 763mq day.       Hypercholesterolemia    On lipitor 2049m day.  Labs reviewed.  Continued low carb diet and exercise.  Follow lipid panel and liver function tests.        Relevant Orders   Hepatic function panel   Lipid panel   Hypertension    Continue losartan.  Follow pressures.  Follow metabolic panel.  Recent GFR within normal limits (87). .      Relevant Orders   Basic metabolic panel   [place] 2 diabetes mellitus [place]cemia [place]  Continues on synjardy and ozempic.  Reported persistent irritation vulva - concern regarding persistent yeast infection.  Will change synjardy to metformin 500m52mo bid and continue ozempic.  Will stop jardiance and Anne [name] if symptoms resolve.   Discussed low-carb diet and exercise.  Follow met b and A1c.       Relevant Medications   metFORMIN (GLUCOPHAGE-XR) 500 MG 24 hr tablet   Other Relevant Orders   Hemoglobin A1c   Vaginal irritation    Persistent.  Change medication as outlined.  Stop jardiance to confirm not contributing.         Return in about 4 months (around 06/02/2022) for physical.   I discussed the assessment and treatment plan with the patient. The patient was provided an opportunity to ask questions and all were answered. The patient agreed with the plan and demonstrated an understanding of the instructions.   The patient was advised to call back or seek an in-person evaluation if the symptoms worsen or if the condition fails to improve as anticipated.   CharEinar Pheasant

## 2022-02-07 NOTE — Assessment & Plan Note (Signed)
Colonoscopy 03/19/20 - one polyp (hepatic flexure), one 3mm polyp (sigmoid colon), diverticulosis  - pathology (polypoid colonic mucosa with benign lymphoid aggregate and polypoid colonic mucosa with mild edema of the lamina propria.  Recommended f/u colonoscopy in 5 years.  

## 2022-02-07 NOTE — Assessment & Plan Note (Signed)
Upper symptoms controlled.  On protonix.  

## 2022-02-07 NOTE — Assessment & Plan Note (Signed)
Discussed.  Increase effexor to 75mg  q day.

## 2022-02-07 NOTE — Assessment & Plan Note (Signed)
On lipitor 20mg q day.  Labs reviewed.  Continued low carb diet and exercise.  Follow lipid panel and liver function tests.   

## 2022-02-07 NOTE — Assessment & Plan Note (Signed)
Persistent.  Change medication as outlined.  Stop jardiance to confirm not contributing.

## 2022-02-07 NOTE — Assessment & Plan Note (Signed)
Continue losartan.  Follow pressures.  Follow metabolic panel.  Recent GFR within normal limits (87). Anne [name]

## 2022-02-08 ENCOUNTER — Telehealth: Payer: Self-pay

## 2022-02-08 NOTE — Telephone Encounter (Signed)
Patient called back and I informed her that the provider wants her to stop the metformin for now and to check her sugars and let us know how she is doing off the medication by next Monday and she understood.  I also informed her to eat bland foods and stay hydrated and she understood.  Semir Brill,cma

## 2022-02-08 NOTE — Telephone Encounter (Signed)
I called and LVM for patient to call back.  Talana Slatten,cma  

## 2022-02-08 NOTE — Telephone Encounter (Signed)
Patient states she is returning Charlyne Mom, CMA's call.  I transferred call to [place].

## 2022-02-08 NOTE — Telephone Encounter (Signed)
Please call and confirm she is doing ok.  Given her symptoms, have her hold the metformin.  (Reviewed sugars and they are ok).  I am not sure if all of her symptoms are related to the metformin, but have her hold.  Bland foods.  Stay hydrated.  Keep [place] posted on how she is doing.

## 2022-02-17 ENCOUNTER — Encounter: Payer: Self-pay | Admitting: Internal Medicine

## 2022-02-18 NOTE — Telephone Encounter (Signed)
Please confirm with her that she is only on ozempic now.  She stopped metformin.  Confirm symptoms have improved/resolved.  If symptoms resolved and only on ozempic, I would like to restart metformin, but have her only take one per day initially.  Call with update over the next 1-2 weeks.  If tolerating will increase dose to one tablet bid.  (She was tolerating this dose previously - prior to medication change).

## 2022-02-22 ENCOUNTER — Other Ambulatory Visit: Payer: Self-pay | Admitting: Internal Medicine

## 2022-03-15 ENCOUNTER — Other Ambulatory Visit: Payer: Self-pay | Admitting: Internal Medicine

## 2022-03-15 DIAGNOSIS — Z1231 Encounter for screening mammogram for malignant neoplasm of breast: Secondary | ICD-10-CM

## 2022-03-22 LAB — HM DIABETES EYE EXAM

## 2022-04-17 ENCOUNTER — Other Ambulatory Visit: Payer: Self-pay | Admitting: Internal Medicine

## 2022-04-18 ENCOUNTER — Ambulatory Visit
Admission: RE | Admit: 2022-04-18 | Discharge: 2022-04-18 | Disposition: A | Discharge status: Home / Self Care | Payer: BC Managed Care – PPO | Source: Ambulatory Visit | Attending: Internal Medicine | Admitting: Internal Medicine

## 2022-04-18 DIAGNOSIS — Z1231 Encounter for screening mammogram for malignant neoplasm of breast: Secondary | ICD-10-CM | POA: Insufficient documentation

## 2022-05-31 ENCOUNTER — Other Ambulatory Visit (INDEPENDENT_AMBULATORY_CARE_PROVIDER_SITE_OTHER): Payer: BC Managed Care – PPO

## 2022-05-31 DIAGNOSIS — E7800 Pure hypercholesterolemia, unspecified: Secondary | ICD-10-CM

## 2022-05-31 DIAGNOSIS — E1165 Type 2 diabetes mellitus with hyperglycemia: Secondary | ICD-10-CM

## 2022-05-31 DIAGNOSIS — I10 Essential (primary) hypertension: Secondary | ICD-10-CM | POA: Diagnosis not present

## 2022-05-31 LAB — LIPID PANEL
Cholesterol: 194 mg/dL (ref 0–200)
HDL: 53 mg/dL (ref >39.00)
NonHDL: 141.2
Total CHOL/HDL Ratio: 4
Triglycerides: 276 mg/dL — ABNORMAL HIGH (ref 0.0–149.0)
VLDL: 55.2 mg/dL — ABNORMAL HIGH (ref 0.0–40.0)

## 2022-05-31 LAB — BASIC METABOLIC PANEL
BUN: 17 mg/dL (ref 6–23)
CO2: 26 mEq/L (ref 19–32)
Calcium: 9.5 mg/dL (ref 8.4–10.5)
Chloride: 105 mEq/L (ref 96–112)
Creatinine, Ser: 0.84 mg/dL (ref 0.40–1.20)
GFR: 78.78 mL/min (ref >60.00)
Glucose, Bld: 186 mg/dL — ABNORMAL HIGH (ref 70–99)
Potassium: 4.2 mEq/L (ref 3.5–5.1)
Sodium: 141 mEq/L (ref 135–145)

## 2022-05-31 LAB — HEPATIC FUNCTION PANEL
ALT: 27 U/L (ref 0–35)
AST: 18 U/L (ref 0–37)
Albumin: 4.1 g/dL (ref 3.5–5.2)
Alkaline Phosphatase: 79 U/L (ref 39–117)
Bilirubin, Direct: 0 mg/dL (ref 0.0–0.3)
Total Bilirubin: 0.3 mg/dL (ref 0.2–1.2)
Total Protein: 7 g/dL (ref 6.0–8.3)

## 2022-05-31 LAB — LDL CHOLESTEROL, DIRECT: Direct LDL: 103 mg/dL

## 2022-05-31 LAB — HEMOGLOBIN A1C: Hgb A1c MFr Bld: 7.9 % — ABNORMAL HIGH (ref 4.6–6.5)

## 2022-06-02 ENCOUNTER — Encounter: Payer: BC Managed Care – PPO | Admitting: Internal Medicine

## 2022-06-10 ENCOUNTER — Ambulatory Visit (INDEPENDENT_AMBULATORY_CARE_PROVIDER_SITE_OTHER): Payer: BC Managed Care – PPO | Admitting: Internal Medicine

## 2022-06-10 ENCOUNTER — Encounter: Payer: Self-pay | Admitting: Internal Medicine

## 2022-06-10 VITALS — BP 132/82 | HR 84 | Temp 98.0°F | Ht 63.0 in | Wt 187.8 lb

## 2022-06-10 DIAGNOSIS — Z0000 Encounter for general adult medical examination without abnormal findings: Secondary | ICD-10-CM | POA: Diagnosis not present

## 2022-06-10 DIAGNOSIS — Z83719 Family history of colon polyps, unspecified: Secondary | ICD-10-CM | POA: Diagnosis not present

## 2022-06-10 DIAGNOSIS — K219 Gastro-esophageal reflux disease without esophagitis: Secondary | ICD-10-CM

## 2022-06-10 DIAGNOSIS — I10 Essential (primary) hypertension: Secondary | ICD-10-CM

## 2022-06-10 DIAGNOSIS — G479 Sleep disorder, unspecified: Secondary | ICD-10-CM

## 2022-06-10 DIAGNOSIS — E7800 Pure hypercholesterolemia, unspecified: Secondary | ICD-10-CM | POA: Diagnosis not present

## 2022-06-10 DIAGNOSIS — E1165 Type 2 diabetes mellitus with hyperglycemia: Secondary | ICD-10-CM

## 2022-06-10 LAB — HM DIABETES FOOT EXAM

## 2022-06-10 MED ORDER — LOSARTAN POTASSIUM 25 MG PO TABS: 25.0000 mg | ORAL_TABLET | Freq: Every day | ORAL | 1 refills | Status: DC

## 2022-06-10 MED ORDER — PANTOPRAZOLE SODIUM 40 MG PO TBEC: 40.0000 mg | DELAYED_RELEASE_TABLET | Freq: Every day | ORAL | 2 refills | Status: DC

## 2022-06-10 MED ORDER — FLUTICASONE PROPIONATE 50 MCG/ACT NA SUSP: 2.0000 | Freq: Every day | NASAL | 6 refills | Status: AC

## 2022-06-10 MED ORDER — OZEMPIC (0.25 OR 0.5 MG/DOSE) 2 MG/3ML ~~LOC~~ SOPN: 0.5000 mg | PEN_INJECTOR | SUBCUTANEOUS | 1 refills | Status: DC

## 2022-06-10 NOTE — Assessment & Plan Note (Signed)
Physical today 06/10/22.  PAP 05/25/21.  Mammogram 04/18/22 - Birads I.  Colonoscopy 03/2020 - f/u 5 years.

## 2022-06-10 NOTE — Progress Notes (Unsigned)
Subjective:    Patient ID: Anne [name], female    DOB: 08-18-1965, 57 y.o.   MRN: [id number]  Patient here for  Chief Complaint  Patient presents with   Annual Exam    HPI Here for physical exam.    Past Medical History:  Diagnosis Date   Anemia    Diabetes mellitus ([place])    Gastritis    History of abnormal Pap smear    s/p LEEP, h/o HPV   Hypercholesterolemia    Hypertension    IBS (irritable bowel syndrome)    Past Surgical History:  Procedure Laterality Date   APPENDECTOMY     BREAST BIOPSY Left 08/25/2017   left breast biopsy neg   COLONOSCOPY WITH PROPOFOL N/A 11/28/2014   Procedure: COLONOSCOPY WITH PROPOFOL;  Surgeon: Lollie Sails, MD;  Location: [facility] ENDOSCOPY;  Service: Endoscopy;  Laterality: N/A;   Family History  Problem Relation Age of Onset   Diabetes Paternal Grandmother    Emphysema Paternal Grandfather    Breast cancer Sister 13   Colon cancer Neg Hx    Social History   Socioeconomic History   Marital status: Single    Spouse name: Not on file   Number of children: 0   Years of education: Not on file   Highest education level: Not on file  Occupational History   Not on file  Tobacco Use   Smoking status: Never   Smokeless tobacco: Never  Substance and Sexual Activity   Alcohol use: No    Alcohol/week: 0.0 standard drinks of alcohol   Drug use: No   Sexual activity: Not on file  Other Topics Concern   Not on file  Social History Narrative   Not on file   Social Determinants of Health   Financial Resource Strain: Low Risk  (05/04/2021)   Overall Financial Resource Strain (CARDIA)    Difficulty of Paying Living Expenses: Not hard at all  Food Insecurity: Not on file  Transportation Needs: Not on file  Physical Activity: Not on file  Stress: Not on file  Social Connections: Not on file     Review of Systems     Objective:     BP 132/82   Pulse 84   Temp 98 F (36.7 C)   Ht 5\' 3"  (1.6 m)   Wt 187 lb 12.8  oz (85.2 kg)   LMP 03/14/2019 (Approximate)   SpO2 98%   BMI 33.27 kg/m  Wt Readings from Last 3 Encounters:  06/10/22 187 lb 12.8 oz (85.2 kg)  01/31/22 175 lb (79.4 kg)  11/03/21 176 lb (79.8 kg)    Physical Exam   Outpatient Encounter Medications as of 06/10/2022  Medication Sig   atorvastatin (LIPITOR) 20 MG tablet Take 1 tablet (20 mg total) by mouth daily.   Lancet Devices (MICROLET NEXT LANCING DEVICE) MISC 1 each by Does not apply route daily.   metFORMIN (GLUCOPHAGE-XR) 500 MG 24 hr tablet TWO TABLETS WITH BREAKFAST AND TWO TABLETS WITH YOUR EVENING MEAL   MICROLET LANCETS MISC 1 each by Does not apply route 3 (three) times daily.   Multiple Vitamin (MULTIVITAMIN) tablet Take 1 tablet by mouth daily.   nystatin cream (MYCOSTATIN) Apply topically 2 (two) times daily.   ONETOUCH ULTRA test strip USE AS INSTRUCTED TO TEST BLOOD SUGAR THREE TIMES DAILY - USING ONETOUCH ULTRA2 SYSTEM   Polysaccharide Iron Complex (FERREX 150 PO) Take 1 capsule by mouth daily.   venlafaxine XR (EFFEXOR-XR) 75 MG  24 hr capsule TAKE 1 CAPSULE BY MOUTH DAILY WITH BREAKFAST.   [DISCONTINUED] fluticasone (FLONASE) 50 MCG/ACT nasal spray Place 2 sprays into both nostrils daily.   [DISCONTINUED] losartan (COZAAR) 25 MG tablet TAKE 1 TABLET (25 MG TOTAL) BY MOUTH DAILY.   [DISCONTINUED] OZEMPIC, 0.25 OR 0.5 MG/DOSE, 2 MG/3ML SOPN INJECT 0.5 MG INTO THE SKIN ONCE A WEEK.   [DISCONTINUED] pantoprazole (PROTONIX) 40 MG tablet TAKE 1 TABLET BY MOUTH EVERY DAY   fluticasone (FLONASE) 50 MCG/ACT nasal spray Place 2 sprays into both nostrils daily.   losartan (COZAAR) 25 MG tablet Take 1 tablet (25 mg total) by mouth daily.   pantoprazole (PROTONIX) 40 MG tablet Take 1 tablet (40 mg total) by mouth daily.   Semaglutide,0.25 or 0.5MG /DOS, (OZEMPIC, 0.25 OR 0.5 MG/DOSE,) 2 MG/3ML SOPN Inject 0.5 mg into the skin once a week.   No facility-administered encounter medications on file as of 06/10/2022.     Lab Results   Component Value Date   WBC 4.2 01/24/2022   HGB 14.2 01/24/2022   HCT 43.1 01/24/2022   PLT 204.0 01/24/2022   GLUCOSE 186 (H) 05/31/2022   CHOL 194 05/31/2022   TRIG 276.0 (H) 05/31/2022   HDL 53.00 05/31/2022   LDLDIRECT 103.0 05/31/2022   LDLCALC 69 04/08/2019   ALT 27 05/31/2022   AST 18 05/31/2022   NA 141 05/31/2022   K 4.2 05/31/2022   CL 105 05/31/2022   CREATININE 0.84 05/31/2022   BUN 17 05/31/2022   CO2 26 05/31/2022   TSH 0.74 09/24/2021   HGBA1C 7.9 (H) 05/31/2022   MICROALBUR 2.0 (H) 01/24/2022    MM 3D SCREEN BREAST BILATERAL  Result Date: 04/19/2022 CLINICAL DATA:  Screening. EXAM: DIGITAL SCREENING BILATERAL MAMMOGRAM WITH TOMOSYNTHESIS AND CAD TECHNIQUE: Bilateral screening digital craniocaudal and mediolateral oblique mammograms were obtained. Bilateral screening digital breast tomosynthesis was performed. The images were evaluated with computer-aided detection. COMPARISON:  Previous exam(s). ACR Breast Density Category b: There are scattered areas of fibroglandular density. FINDINGS: There are no findings suspicious for malignancy. IMPRESSION: No mammographic evidence of malignancy. A result letter of this screening mammogram will be mailed directly to the patient. RECOMMENDATION: Screening mammogram in one year. (Code:SM-B-01Y) BI-RADS CATEGORY  1: Negative. Electronically Signed   By: Dorise Bullion III M.D.   On: 04/19/2022 18:25       Assessment & Plan:  Health care maintenance Assessment & Plan: Physical today 06/10/22.  PAP 05/25/21.  Mammogram 04/18/22 - Birads I.  Colonoscopy 03/2020 - f/u 5 years.    Other orders -     Fluticasone Propionate; Place 2 sprays into both nostrils daily.  Dispense: 16 g; Refill: 6 -     Losartan Potassium; Take 1 tablet (25 mg total) by mouth daily.  Dispense: 90 tablet; Refill: 1 -     Pantoprazole Sodium; Take 1 tablet (40 mg total) by mouth daily.  Dispense: 90 tablet; Refill: 2 -     Ozempic (0.25 or 0.5 MG/DOSE);  Inject 0.5 mg into the skin once a week.  Dispense: 3 mL; Refill: 1     Einar Pheasant, MD

## 2022-06-12 ENCOUNTER — Encounter: Payer: Self-pay | Admitting: Internal Medicine

## 2022-06-12 DIAGNOSIS — G479 Sleep disorder, unspecified: Secondary | ICD-10-CM | POA: Insufficient documentation

## 2022-06-12 NOTE — Assessment & Plan Note (Signed)
Off jardiance.  Taking metformin q day.  On ozempic.  Will increase to .5mg .  Discussed low-carb diet and exercise.  Follow met b and A1c.

## 2022-06-12 NOTE — Assessment & Plan Note (Signed)
Continue losartan.  Follow pressures.  Follow metabolic panel.  

## 2022-06-12 NOTE — Assessment & Plan Note (Signed)
On lipitor 20mg  q day.  Labs reviewed.  Continued low carb diet and exercise.  Follow lipid panel and liver function tests.

## 2022-06-12 NOTE — Assessment & Plan Note (Signed)
Acid reflux as outlined.  Occurs at night when eats during the night.  Protonix.  Increase ozempic.  Discussed the need to stop eating at night.

## 2022-06-12 NOTE — Assessment & Plan Note (Signed)
Sleep issues as outlined.  Discussed.  Hold on medication at this time.  Work on diet and exercise.  Follow.

## 2022-06-12 NOTE — Assessment & Plan Note (Signed)
Colonoscopy 03/19/20 - one polyp (hepatic flexure), one 3mm polyp (sigmoid colon), diverticulosis  - pathology (polypoid colonic mucosa with benign lymphoid aggregate and polypoid colonic mucosa with mild edema of the lamina propria.  Recommended f/u colonoscopy in 5 years.  

## 2022-06-24 ENCOUNTER — Encounter: Payer: Self-pay | Admitting: Internal Medicine

## 2022-06-25 ENCOUNTER — Other Ambulatory Visit: Payer: Self-pay | Admitting: Internal Medicine

## 2022-06-25 MED ORDER — LOSARTAN POTASSIUM 50 MG PO TABS: 50.0000 mg | ORAL_TABLET | Freq: Every day | ORAL | 1 refills | Status: DC

## 2022-06-25 NOTE — Progress Notes (Signed)
Rx sent in for losartan 50mg  #90 with one refill.

## 2022-07-09 NOTE — Telephone Encounter (Signed)
Anne [name] sent a message regarding her eye exam.  See message.  Not sure if we have received.  If so, can we make sure is abstracted.  If have not received, need to get a copy.   Thnanks.

## 2022-07-11 ENCOUNTER — Encounter: Payer: Self-pay | Admitting: Internal Medicine

## 2022-07-11 NOTE — Telephone Encounter (Signed)
Received records and abstracted eye exam

## 2022-07-11 NOTE — Telephone Encounter (Signed)
Lm at [facility] eye care for results of eye exam

## 2022-07-29 ENCOUNTER — Telehealth: Payer: BC Managed Care – PPO | Admitting: Internal Medicine

## 2022-08-03 ENCOUNTER — Encounter: Payer: Self-pay | Admitting: Internal Medicine

## 2022-08-04 ENCOUNTER — Telehealth (INDEPENDENT_AMBULATORY_CARE_PROVIDER_SITE_OTHER): Payer: BC Managed Care – PPO | Admitting: Internal Medicine

## 2022-08-04 ENCOUNTER — Encounter: Payer: Self-pay | Admitting: Internal Medicine

## 2022-08-04 VITALS — BP 118/77 | Ht 63.0 in | Wt 176.0 lb

## 2022-08-04 DIAGNOSIS — Z83719 Family history of colon polyps, unspecified: Secondary | ICD-10-CM

## 2022-08-04 DIAGNOSIS — E7800 Pure hypercholesterolemia, unspecified: Secondary | ICD-10-CM

## 2022-08-04 DIAGNOSIS — E1165 Type 2 diabetes mellitus with hyperglycemia: Secondary | ICD-10-CM | POA: Diagnosis not present

## 2022-08-04 DIAGNOSIS — G4733 Obstructive sleep apnea (adult) (pediatric): Secondary | ICD-10-CM

## 2022-08-04 DIAGNOSIS — I10 Essential (primary) hypertension: Secondary | ICD-10-CM

## 2022-08-04 NOTE — Progress Notes (Signed)
Patient ID: Anne [name], female   DOB: 03/12/1966, 57 y.o.   MRN: [id number]   Virtual Visit via video Note  All issues noted in this document were discussed and addressed.  No physical exam was performed (except for noted visual exam findings with Video Visits).   I connected with Christa See by a video enabled telemedicine application and verified that I am speaking with the correct person using two identifiers. Location patient: home Location provider: work Persons participating in the virtual visit: patient, provider  The limitations, risks, security and privacy concerns of performing an evaluation and management service by video and the availability of in person appointments have been discussed.  It has also been discussed with the patient that there may be a patient responsible charge related to this service. The patient expressed understanding and agreed to proceed.   Reason for visit:  follow up appt  HPI: Follow up regarding diabetes, hypertension and hypercholesterolemia.  Tolerating ozempic and one bid metformin.  Doing well.  Has lost weight.  No chest pain or sob reported.  No abdominal pain.  Occasional nausea - if takes metformin or multivitamin - on empty stomach.  Reports is not a significant issue.  Has decreased sweet tea intake.  Drinks Dr Malachi Bonds zero.    ROS: See pertinent positives and negatives per HPI.  Past Medical History:  Diagnosis Date   Anemia    Diabetes mellitus ([place])    Gastritis    History of abnormal Pap smear    s/p LEEP, h/o HPV   Hypercholesterolemia    Hypertension    IBS (irritable bowel syndrome)     Past Surgical History:  Procedure Laterality Date   APPENDECTOMY     BREAST BIOPSY Left 08/25/2017   left breast biopsy neg   COLONOSCOPY WITH PROPOFOL N/A 11/28/2014   Procedure: COLONOSCOPY WITH PROPOFOL;  Surgeon: Lollie Sails, MD;  Location: [facility] ENDOSCOPY;  Service: Endoscopy;  Laterality: N/A;    Family History   Problem Relation Age of Onset   Diabetes Paternal Grandmother    Emphysema Paternal Grandfather    Breast cancer Sister 80   Colon cancer Neg Hx     SOCIAL HX: reviewed    Current Outpatient Medications:    fluticasone (FLONASE) 50 MCG/ACT nasal spray, Place 2 sprays into both nostrils daily., Disp: 16 g, Rfl: 6   Lancet Devices (MICROLET NEXT LANCING DEVICE) MISC, 1 each by Does not apply route daily., Disp: 1 each, Rfl: 0   MICROLET LANCETS MISC, 1 each by Does not apply route 3 (three) times daily., Disp: 100 each, Rfl: 11   Multiple Vitamin (MULTIVITAMIN) tablet, Take 1 tablet by mouth daily., Disp: , Rfl:    nystatin cream (MYCOSTATIN), Apply topically 2 (two) times daily., Disp: 30 g, Rfl: 0   ONETOUCH ULTRA test strip, USE AS INSTRUCTED TO TEST BLOOD SUGAR THREE TIMES DAILY - USING ONETOUCH ULTRA2 SYSTEM, Disp: 100 strip, Rfl: 8   pantoprazole (PROTONIX) 40 MG tablet, Take 1 tablet (40 mg total) by mouth daily., Disp: 90 tablet, Rfl: 2   Polysaccharide Iron Complex (FERREX 150 PO), Take 1 capsule by mouth daily., Disp: , Rfl:    atorvastatin (LIPITOR) 20 MG tablet, Take 1 tablet (20 mg total) by mouth daily., Disp: 90 tablet, Rfl: 1   losartan (COZAAR) 50 MG tablet, Take 1 tablet (50 mg total) by mouth daily., Disp: 90 tablet, Rfl: 1   metFORMIN (GLUCOPHAGE-XR) 500 MG 24 hr tablet, Take 1 tablet (  500 mg total) by mouth 2 (two) times daily with a meal., Disp: 180 tablet, Rfl: 1   Semaglutide,0.25 or 0.'5MG'$ /DOS, (OZEMPIC, 0.25 OR 0.5 MG/DOSE,) 2 MG/3ML SOPN, Inject 0.5 mg into the skin once a week., Disp: 3 mL, Rfl: 1   venlafaxine XR (EFFEXOR-XR) 75 MG 24 hr capsule, Take 1 capsule (75 mg total) by mouth daily with breakfast., Disp: 90 capsule, Rfl: 1  EXAM:  GENERAL: alert, oriented, appears well and in no acute distress  HEENT: atraumatic, conjunttiva clear, no obvious abnormalities on inspection of external nose and ears  NECK: normal movements of the head and neck  LUNGS:  on inspection no signs of respiratory distress, breathing rate appears normal, no obvious gross SOB, gasping or wheezing  CV: no obvious cyanosis  PSYCH/NEURO: pleasant and cooperative, no obvious depression or anxiety, speech and thought processing grossly intact  ASSESSMENT AND PLAN:  Discussed the following assessment and plan:  Problem List Items Addressed This Visit     Type 2 diabetes mellitus with hyperglycemia (HCC)    Taking metformin bid.  On ozempic -.'5mg'$ .  Discussed low-carb diet and exercise.  Follow met b and A1c. Has adjusted diet.        Relevant Medications   atorvastatin (LIPITOR) 20 MG tablet   losartan (COZAAR) 50 MG tablet   metFORMIN (GLUCOPHAGE-XR) 500 MG 24 hr tablet   Semaglutide,0.25 or 0.'5MG'$ /DOS, (OZEMPIC, 0.25 OR 0.5 MG/DOSE,) 2 MG/3ML SOPN   Obstructive sleep apnea    Not using cpap.  Off ambien.  Have discussed with her regarding importance of treating sleep apnea.        Hypertension    Continue losartan.  Follow pressures.  Follow metabolic panel.       Relevant Medications   atorvastatin (LIPITOR) 20 MG tablet   losartan (COZAAR) 50 MG tablet   Hypercholesterolemia - Primary    On lipitor '20mg'$  q day.  Continue low carb diet and exercise.  Follow lipid panel and liver function tests.        Relevant Medications   atorvastatin (LIPITOR) 20 MG tablet   losartan (COZAAR) 50 MG tablet   Family history of colonic polyps     Colonoscopy 03/19/20 - one polyp (hepatic flexure), one 49m polyp (sigmoid colon), diverticulosis  - pathology (polypoid colonic mucosa with benign lymphoid aggregate and polypoid colonic mucosa with mild edema of the lamina propria.  Recommended f/u colonoscopy in 5 years.        Return in about 3 months (around 11/02/2022) for physical.   I discussed the assessment and treatment plan with the patient. The patient was provided an opportunity to ask questions and all were answered. The patient agreed with the plan and  demonstrated an understanding of the instructions.   The patient was advised to call back or seek an in-person evaluation if the symptoms worsen or if the condition fails to improve as anticipated.    CEinar Pheasant MD

## 2022-08-08 ENCOUNTER — Encounter: Payer: Self-pay | Admitting: Internal Medicine

## 2022-08-08 ENCOUNTER — Telehealth: Payer: Self-pay | Admitting: Internal Medicine

## 2022-08-08 DIAGNOSIS — E1165 Type 2 diabetes mellitus with hyperglycemia: Secondary | ICD-10-CM

## 2022-08-08 DIAGNOSIS — I10 Essential (primary) hypertension: Secondary | ICD-10-CM

## 2022-08-08 DIAGNOSIS — E7800 Pure hypercholesterolemia, unspecified: Secondary | ICD-10-CM

## 2022-08-08 MED ORDER — VENLAFAXINE HCL ER 75 MG PO CP24: 75.0000 mg | ORAL_CAPSULE | Freq: Every day | ORAL | 1 refills | Status: DC

## 2022-08-08 MED ORDER — ATORVASTATIN CALCIUM 20 MG PO TABS: 20.0000 mg | ORAL_TABLET | Freq: Every day | ORAL | 1 refills | Status: DC

## 2022-08-08 MED ORDER — METFORMIN HCL ER 500 MG PO TB24: 500.0000 mg | ORAL_TABLET | Freq: Two times a day (BID) | ORAL | 1 refills | Status: DC

## 2022-08-08 MED ORDER — LOSARTAN POTASSIUM 50 MG PO TABS: 50.0000 mg | ORAL_TABLET | Freq: Every day | ORAL | 1 refills | Status: DC

## 2022-08-08 MED ORDER — OZEMPIC (0.25 OR 0.5 MG/DOSE) 2 MG/3ML ~~LOC~~ SOPN: 0.5000 mg | PEN_INJECTOR | SUBCUTANEOUS | 1 refills | Status: DC

## 2022-08-08 NOTE — Telephone Encounter (Signed)
Patient called to make a lab appointment to check A1c and Losartan per Dr Nicki Reaper. No labs have been ordered.

## 2022-08-08 NOTE — Assessment & Plan Note (Signed)
Colonoscopy 03/19/20 - one polyp (hepatic flexure), one 55m polyp (sigmoid colon), diverticulosis  - pathology (polypoid colonic mucosa with benign lymphoid aggregate and polypoid colonic mucosa with mild edema of the lamina propria.  Recommended f/u colonoscopy in 5 years.

## 2022-08-08 NOTE — Assessment & Plan Note (Signed)
Continue losartan.  Follow pressures.  Follow metabolic panel.

## 2022-08-08 NOTE — Assessment & Plan Note (Addendum)
Taking metformin bid.  On ozempic -.'5mg'$ .  Discussed low-carb diet and exercise.  Follow met b and A1c. Has adjusted diet.

## 2022-08-08 NOTE — Assessment & Plan Note (Signed)
On lipitor '20mg'$  q day.  Continue low carb diet and exercise.  Follow lipid panel and liver function tests.

## 2022-08-08 NOTE — Assessment & Plan Note (Signed)
Not using cpap.  Off ambien.  Have discussed with her regarding importance of treating sleep apnea.

## 2022-08-08 NOTE — Telephone Encounter (Signed)
Future labs ordered.  

## 2022-08-09 NOTE — Telephone Encounter (Signed)
Labs scheduled  

## 2022-09-18 ENCOUNTER — Encounter: Payer: Self-pay | Admitting: Internal Medicine

## 2022-09-19 ENCOUNTER — Other Ambulatory Visit (INDEPENDENT_AMBULATORY_CARE_PROVIDER_SITE_OTHER): Payer: BC Managed Care – PPO

## 2022-09-19 DIAGNOSIS — E7800 Pure hypercholesterolemia, unspecified: Secondary | ICD-10-CM

## 2022-09-19 DIAGNOSIS — E1165 Type 2 diabetes mellitus with hyperglycemia: Secondary | ICD-10-CM

## 2022-09-19 DIAGNOSIS — I10 Essential (primary) hypertension: Secondary | ICD-10-CM

## 2022-09-19 LAB — HEPATIC FUNCTION PANEL
ALT: 30 U/L (ref 0–35)
AST: 22 U/L (ref 0–37)
Albumin: 4.2 g/dL (ref 3.5–5.2)
Alkaline Phosphatase: 79 U/L (ref 39–117)
Bilirubin, Direct: 0.1 mg/dL (ref 0.0–0.3)
Total Bilirubin: 0.5 mg/dL (ref 0.2–1.2)
Total Protein: 6.6 g/dL (ref 6.0–8.3)

## 2022-09-19 LAB — BASIC METABOLIC PANEL
BUN: 14 mg/dL (ref 6–23)
CO2: 26 mEq/L (ref 19–32)
Calcium: 9 mg/dL (ref 8.4–10.5)
Chloride: 105 mEq/L (ref 96–112)
Creatinine, Ser: 0.81 mg/dL (ref 0.40–1.20)
GFR: 82.12 mL/min (ref >60.00)
Glucose, Bld: 165 mg/dL — ABNORMAL HIGH (ref 70–99)
Potassium: 4 mEq/L (ref 3.5–5.1)
Sodium: 140 mEq/L (ref 135–145)

## 2022-09-19 LAB — LIPID PANEL
Cholesterol: 160 mg/dL (ref 0–200)
HDL: 50 mg/dL (ref >39.00)
NonHDL: 109.93
Total CHOL/HDL Ratio: 3
Triglycerides: 229 mg/dL — ABNORMAL HIGH (ref 0.0–149.0)
VLDL: 45.8 mg/dL — ABNORMAL HIGH (ref 0.0–40.0)

## 2022-09-19 LAB — HEMOGLOBIN A1C: Hgb A1c MFr Bld: 7 % — ABNORMAL HIGH (ref 4.6–6.5)

## 2022-09-19 LAB — LDL CHOLESTEROL, DIRECT: Direct LDL: 83 mg/dL

## 2022-10-12 ENCOUNTER — Other Ambulatory Visit: Payer: Self-pay | Admitting: Internal Medicine

## 2022-10-20 ENCOUNTER — Encounter: Payer: Self-pay | Admitting: Internal Medicine

## 2022-10-21 NOTE — Telephone Encounter (Signed)
There are a few different options - ferrous sulfate 325mg  or prescription iron - integra q day or nu iron 150mg  q day.

## 2022-10-21 NOTE — Telephone Encounter (Signed)
Patient says she has been taking ferrex 150 for years and when she went to [facility] to get it they were out of stock and unsure when they would have it again. They have the same supplement on [place] and she can check with other pharmacies but wanted to know of another iron supplement you recommend if she decides to switch to something different and get it at [facility]

## 2022-12-06 ENCOUNTER — Other Ambulatory Visit: Payer: Self-pay | Admitting: Internal Medicine

## 2022-12-14 ENCOUNTER — Telehealth: Payer: Self-pay | Admitting: Internal Medicine

## 2022-12-14 ENCOUNTER — Encounter: Payer: Self-pay | Admitting: Internal Medicine

## 2022-12-14 ENCOUNTER — Telehealth (INDEPENDENT_AMBULATORY_CARE_PROVIDER_SITE_OTHER): Payer: BC Managed Care – PPO | Admitting: Internal Medicine

## 2022-12-14 DIAGNOSIS — Z0000 Encounter for general adult medical examination without abnormal findings: Secondary | ICD-10-CM

## 2022-12-14 DIAGNOSIS — Z7984 Long term (current) use of oral hypoglycemic drugs: Secondary | ICD-10-CM

## 2022-12-14 DIAGNOSIS — Z83719 Family history of colon polyps, unspecified: Secondary | ICD-10-CM

## 2022-12-14 DIAGNOSIS — E1165 Type 2 diabetes mellitus with hyperglycemia: Secondary | ICD-10-CM

## 2022-12-14 DIAGNOSIS — I10 Essential (primary) hypertension: Secondary | ICD-10-CM

## 2022-12-14 DIAGNOSIS — Z862 Personal history of diseases of the blood and blood-forming organs and certain disorders involving the immune mechanism: Secondary | ICD-10-CM

## 2022-12-14 DIAGNOSIS — E7800 Pure hypercholesterolemia, unspecified: Secondary | ICD-10-CM

## 2022-12-14 DIAGNOSIS — Z8619 Personal history of other infectious and parasitic diseases: Secondary | ICD-10-CM

## 2022-12-14 NOTE — Assessment & Plan Note (Signed)
Taking metformin bid.  On ozempic -.5mg .  Discussed low-carb diet and exercise.  Follow met b and A1c. Has adjusted diet.  Lost weight.  Feels good.  Sugars as outlined.

## 2022-12-14 NOTE — Assessment & Plan Note (Signed)
Status post colposcopy (Dr. Cherry) 2015.  Pap 11/2016-negative with negative HPV.  Follow-up Pap November 2020 negative with negative HPV. Repeat 05/25/21 - negative with negative HPV.   ?

## 2022-12-14 NOTE — Assessment & Plan Note (Signed)
On lipitor 20mg q day.  Continue low carb diet and exercise.  Follow lipid panel and liver function tests.   

## 2022-12-14 NOTE — Telephone Encounter (Signed)
She is scheduled for appointments with you 10/16 and 1/9. CPE is due in November but patient does not want to be seen in November. Are you ok with doing the 10/16 appt as a 38m f/u virtually and then do her 1/9 appt as her CPE?

## 2022-12-14 NOTE — Assessment & Plan Note (Signed)
Continue losartan.  Follow pressures.  Follow metabolic panel.  No dizziness or light headedness.

## 2022-12-14 NOTE — Telephone Encounter (Signed)
Pt called in to make a 30-months f/u with provider. Pt its book for 03/22/23, however, its another Virtual, but she stated if Dr. Lorin Picket would like for her to come in person? Please advice She would like a call back for a message through MyChart.

## 2022-12-14 NOTE — Assessment & Plan Note (Signed)
Taking iron daily.  Follow cbc and iron studies.   

## 2022-12-14 NOTE — Telephone Encounter (Signed)
Patient is aware 

## 2022-12-14 NOTE — Assessment & Plan Note (Signed)
Colonoscopy 03/19/20 - one polyp (hepatic flexure), one 3mm polyp (sigmoid colon), diverticulosis  - pathology (polypoid colonic mucosa with benign lymphoid aggregate and polypoid colonic mucosa with mild edema of the lamina propria.  Recommended f/u colonoscopy in 5 years.  

## 2022-12-14 NOTE — Telephone Encounter (Signed)
Ok to have the appt - virtual in 03/2023.  Does need the lab appt.  Ok for in office CPE in 06/2023

## 2022-12-14 NOTE — Assessment & Plan Note (Deleted)
Taking iron daily.  Follow cbc and iron studies.   

## 2022-12-14 NOTE — Progress Notes (Signed)
Patient ID: Anne [name], female   DOB: 1966/01/29, 57 y.o.   MRN: [id number]   Virtual Visit via vidoe Note  I connected with Anne [name]) Lodema Hong today by a video enabled telemedicine application and verified that I am speaking with the correct person using two identifiers. Location patient: home Location provider: work  Persons participating in the virtual visit: patient, provider  the limitations, risks, security and privacy concerns of performing an evaluation and management service by video and the availability of in person appointments have been discussed.  It has also been discussed with the patient that there may be a patient responsible charge related to this service. The patient expressed understanding and agreed to proceed.   Reason for visit: follow up appt.   HPI: Follow up regarding diabetes, hypertension and hypercholesterolemia.  She is doing well.  Staying active.  No chest pain or sob reported.  Denies headache or dizziness.  No light headedness.  No abdominal pain reported.  Occasionally after eating certain foods (Chipl[place]) will have loose stool.  Tolerating ozempic.  Discussed benefiber to regulate bowels.  Discussed outside blood sugars - am readings 87-150 with one am reading 179.  Higher readings - later in am.  Sleeping later.  Discussed eating protein snack prior to bed.  No problems with low blood sugars.  Blood pressures 102-110/80.     ROS: See pertinent positives and negatives per HPI.  Past Medical History:  Diagnosis Date   Anemia    Diabetes mellitus (HCC)    Gastritis    History of abnormal Pap smear    s/p LEEP, h/o HPV   Hypercholesterolemia    Hypertension    IBS (irritable bowel syndrome)     Past Surgical History:  Procedure Laterality Date   APPENDECTOMY     BREAST BIOPSY Left 08/25/2017   left breast biopsy neg   COLONOSCOPY WITH PROPOFOL N/A 11/28/2014   Procedure: COLONOSCOPY WITH PROPOFOL;  Surgeon: Christena Deem, MD;   Location: [facility] ENDOSCOPY;  Service: Endoscopy;  Laterality: N/A;    Family History  Problem Relation Age of Onset   Diabetes Paternal Grandmother    Emphysema Paternal Grandfather    Breast cancer Sister 93   Colon cancer Neg Hx     SOCIAL HX: reviewed.    Current Outpatient Medications:    atorvastatin (LIPITOR) 20 MG tablet, Take 1 tablet (20 mg total) by mouth daily., Disp: 90 tablet, Rfl: 1   fluticasone (FLONASE) 50 MCG/ACT nasal spray, Place 2 sprays into both nostrils daily., Disp: 16 g, Rfl: 6   Lancet Devices (MICROLET NEXT LANCING DEVICE) MISC, 1 each by Does not apply route daily., Disp: 1 each, Rfl: 0   losartan (COZAAR) 50 MG tablet, Take 1 tablet (50 mg total) by mouth daily., Disp: 90 tablet, Rfl: 1   metFORMIN (GLUCOPHAGE-XR) 500 MG 24 hr tablet, Take 1 tablet (500 mg total) by mouth 2 (two) times daily with a meal., Disp: 180 tablet, Rfl: 1   MICROLET LANCETS MISC, 1 each by Does not apply route 3 (three) times daily., Disp: 100 each, Rfl: 11   Multiple Vitamin (MULTIVITAMIN) tablet, Take 1 tablet by mouth daily., Disp: , Rfl:    nystatin cream (MYCOSTATIN), Apply topically 2 (two) times daily., Disp: 30 g, Rfl: 0   ONETOUCH ULTRA test strip, USE AS INSTRUCTED TO TEST BLOOD SUGAR THREE TIMES DAILY - USING ONETOUCH ULTRA2 SYSTEM, Disp: 100 strip, Rfl: 8   pantoprazole (PROTONIX) 40 MG tablet, Take 1  tablet (40 mg total) by mouth daily., Disp: 90 tablet, Rfl: 2   Polysaccharide Iron Complex (FERREX 150 PO), Take 1 capsule by mouth daily., Disp: , Rfl:    Semaglutide,0.25 or 0.5MG /DOS, (OZEMPIC, 0.25 OR 0.5 MG/DOSE,) 2 MG/3ML SOPN, INJECT 0.5 MG INTO THE SKIN ONE TIME PER WEEK, Disp: 3 mL, Rfl: 1   venlafaxine XR (EFFEXOR-XR) 75 MG 24 hr capsule, Take 1 capsule (75 mg total) by mouth daily with breakfast., Disp: 90 capsule, Rfl: 1  EXAM:  VITALS per patient if applicable: 103/83  GENERAL: alert, oriented, appears well and in no acute distress  HEENT: atraumatic,  conjunttiva clear, no obvious abnormalities on inspection of external nose and ears  NECK: normal movements of the head and neck  LUNGS: on inspection no signs of respiratory distress, breathing rate appears normal, no obvious gross SOB, gasping or wheezing  CV: no obvious cyanosis  PSYCH/NEURO: pleasant and cooperative, no obvious depression or anxiety, speech and thought processing grossly intact  ASSESSMENT AND PLAN:  Discussed the following assessment and plan:  Problem List Items Addressed This Visit     Type 2 diabetes mellitus with hyperglycemia (HCC) - Primary    Taking metformin bid.  On ozempic -.5mg .  Discussed low-carb diet and exercise.  Follow met b and A1c. Has adjusted diet.  Lost weight.  Feels good.  Sugars as outlined.       Hypertension    Continue losartan.  Follow pressures.  Follow metabolic panel.  No dizziness or light headedness.        Hypercholesterolemia    On lipitor 20mg  q day.  Continue low carb diet and exercise.  Follow lipid panel and liver function tests.        History of HPV infection    Status post colposcopy (Dr. Valentino Saxon) 2015.  Pap 11/2016-negative with negative HPV.  Follow-up Pap November 2020 negative with negative HPV. Repeat 05/25/21 - negative with negative HPV.        History of anemia    Taking iron daily.  Follow cbc and iron studies.       Health care maintenance   Family history of colonic polyps     Colonoscopy 03/19/20 - one polyp (hepatic flexure), one 3mm polyp (sigmoid colon), diverticulosis  - pathology (polypoid colonic mucosa with benign lymphoid aggregate and polypoid colonic mucosa with mild edema of the lamina propria.  Recommended f/u colonoscopy in 5 years.        No follow-ups on file.   I discussed the assessment and treatment plan with the patient. The patient was provided an opportunity to ask questions and all were answered. The patient agreed with the plan and demonstrated an understanding of the  instructions.   The patient was advised to call back or seek an in-person evaluation if the symptoms worsen or if the condition fails to improve as anticipated.    Dale [place], MD

## 2022-12-18 ENCOUNTER — Other Ambulatory Visit: Payer: Self-pay | Admitting: Internal Medicine

## 2023-01-16 ENCOUNTER — Encounter: Payer: Self-pay | Admitting: Internal Medicine

## 2023-01-18 ENCOUNTER — Ambulatory Visit: Payer: BC Managed Care – PPO | Admitting: Family

## 2023-01-18 VITALS — BP 128/82 | HR 78 | Temp 98.0°F | Ht 63.0 in | Wt 171.6 lb

## 2023-01-18 DIAGNOSIS — Z7984 Long term (current) use of oral hypoglycemic drugs: Secondary | ICD-10-CM | POA: Diagnosis not present

## 2023-01-18 DIAGNOSIS — Z7985 Long-term (current) use of injectable non-insulin antidiabetic drugs: Secondary | ICD-10-CM

## 2023-01-18 DIAGNOSIS — E1165 Type 2 diabetes mellitus with hyperglycemia: Secondary | ICD-10-CM | POA: Diagnosis not present

## 2023-01-18 DIAGNOSIS — M62838 Other muscle spasm: Secondary | ICD-10-CM | POA: Diagnosis not present

## 2023-01-18 LAB — COMPREHENSIVE METABOLIC PANEL
ALT: 22 U/L (ref 0–35)
AST: 18 U/L (ref 0–37)
Albumin: 4.5 g/dL (ref 3.5–5.2)
Alkaline Phosphatase: 71 U/L (ref 39–117)
BUN: 14 mg/dL (ref 6–23)
CO2: 28 mEq/L (ref 19–32)
Calcium: 9.7 mg/dL (ref 8.4–10.5)
Chloride: 102 mEq/L (ref 96–112)
Creatinine, Ser: 0.78 mg/dL (ref 0.40–1.20)
GFR: 85.73 mL/min (ref >60.00)
Glucose, Bld: 133 mg/dL — ABNORMAL HIGH (ref 70–99)
Potassium: 3.7 mEq/L (ref 3.5–5.1)
Sodium: 137 mEq/L (ref 135–145)
Total Bilirubin: 0.5 mg/dL (ref 0.2–1.2)
Total Protein: 7.7 g/dL (ref 6.0–8.3)

## 2023-01-18 LAB — URINALYSIS, ROUTINE W REFLEX MICROSCOPIC
Bilirubin Urine: NEGATIVE
Hgb urine dipstick: NEGATIVE
Ketones, ur: NEGATIVE
Leukocytes,Ua: NEGATIVE
Nitrite: NEGATIVE
Specific Gravity, Urine: >=1.03 — AB (ref 1.000–1.030)
Total Protein, Urine: NEGATIVE
Urine Glucose: NEGATIVE
Urobilinogen, UA: 0.2 (ref 0.0–1.0)
pH: 6 (ref 5.0–8.0)

## 2023-01-18 LAB — HEMOGLOBIN A1C: Hgb A1c MFr Bld: 7 % — ABNORMAL HIGH (ref 4.6–6.5)

## 2023-01-18 MED ORDER — CYCLOBENZAPRINE HCL 5 MG PO TABS
5.0000 mg | ORAL_TABLET | Freq: Every evening | ORAL | 1 refills | Status: AC | PRN
Start: 2023-01-18 — End: ?

## 2023-01-18 NOTE — Patient Instructions (Addendum)
Symptoms most consistent with muscular spasm   may trial tylenol arthritis 650mg  , 2 tablets in the morning and 2 tablets in evening.  If this is ineffective, I would start aleve naproxen sodium  500 mg once, followed by 250 to 500 mg every 12 hours as needed.  Please monitor blood pressure.    Nice to meet you!  Muscle Cramps and Spasms Muscle cramps and spasms occur when a muscle or muscles tighten and you have no control over this tightening (involuntary muscle contraction). They are a common problem that can happen in any muscle. The most common place is in the calf muscles of the leg. There are a few ways that muscle cramps and spasms differ: Muscle cramps are painful. They come and go and may last for a few seconds or up to 15 minutes. Muscle cramps are often more forceful and last longer than muscle spasms. Muscle spasms may or may not be painful. They may last just a few seconds or last much longer. Certain conditions, such as diabetes or Parkinson's disease, can make you more likely to have cramps or spasms. But in most cases, cramps and spasms are not caused by other conditions. Common causes include: Overexertion. This is when you do more physical work or exercise than your body is ready for. Overuse from doing the same movements too many times. Staying in one position for too long. Improper preparation, form, or technique when playing a sport or doing an activity. Not enough water or other fluids in your body (dehydration). Other causes may include: Injury. Side effects of some medicines. Too few salts and minerals in your body (electrolytes), such as potassium and calcium. This could happen if you are taking water pills (diuretics) or if you are pregnant. In many cases, the cause of muscle cramps or spasms is not known. Follow these instructions at home: Eating and drinking Drink enough fluid to keep your pee (urine) pale yellow. This can help prevent cramps or spasms. Eat a  healthy diet that includes a lot of nutrients to help your muscles work. A healthy diet includes fruits and vegetables, lean protein, whole grains, and low-fat or nonfat dairy products. Managing pain and stiffness     Try to massage, stretch, and relax the affected muscle. Do this for a few minutes at a time. If told, put ice on the muscles. This may help if you are sore or have pain after a cramp or spasm. Put ice in a plastic bag. Place a towel between your skin and the bag. Leave the ice on for 20 minutes, 2-3 times a day. If told, apply heat to tight or tense muscles as often as told by your health care provider. Use the heat source that your provider recommends, such as a moist heat pack or a heating pad. Place a towel between your skin and the heat source. Leave the heat on for 20-30 minutes. If your skin turns bright red, remove the ice or heat right away to prevent skin damage. The risk of damage is higher if you cannot feel pain, heat, or cold. Take hot showers or baths to help relax tight muscles. General instructions If you are having cramps often, avoid intense exercise for a few days. Take over-the-counter and prescription medicines only as told by your provider. Watch for any changes in your symptoms. Contact a health care provider if: Your cramps or spasms get more severe or happen more often. Your cramps or spasms do not get better  over time. This information is not intended to replace advice given to you by your health care provider. Make sure you discuss any questions you have with your health care provider. Document Revised: 01/11/2022 Document Reviewed: 01/11/2022 Elsevier Patient Education  2024 [employer].

## 2023-01-18 NOTE — Telephone Encounter (Signed)
Called patient. Scheduled her with Claris Che this morning. Still having right sided back pain after replacing decking boards over the weekend. Patient has been trying muscle rub, ibuprofen, heat, etc with little to no relief.

## 2023-01-18 NOTE — Assessment & Plan Note (Signed)
Symptoms consistent with muscular spasm.  Treat with heat, Flexeril 5 to 10 mg at bedtime prn.  At this time, no symptoms to suggest alternate diagnosis including urinary tract infection.  However if the pain does not respond to conservative therapy, I have asked patient to let me know.

## 2023-01-18 NOTE — Progress Notes (Signed)
Assessment & Plan:  Muscle spasm Assessment & Plan: Symptoms consistent with muscular spasm.  Treat with heat, Flexeril 5 to 10 mg at bedtime prn.  At this time, no symptoms to suggest alternate diagnosis including urinary tract infection.  However if the pain does not respond to conservative therapy, I have asked patient to let me know.  Orders: -     Cyclobenzaprine HCl; Take 1-2 tablets (5-10 mg total) by mouth at bedtime as needed for muscle spasms.  Dispense: 30 tablet; Refill: 1 -     Urinalysis, Routine w reflex microscopic  Type 2 diabetes mellitus with hyperglycemia, without long-term current use of insulin (HCC) -     Hemoglobin A1c -     Comprehensive metabolic panel     Return precautions given.   Risks, benefits, and alternatives of the medications and treatment plan prescribed today were discussed, and patient expressed understanding.   Education regarding symptom management and diagnosis given to patient on AVS either electronically or printed.  No follow-ups on file.  Rennie Plowman, FNP  Subjective:    Patient ID: Anne [name], female    DOB: 07/11/1965, 57 y.o.   MRN: [id number]  CC: Anne [name] is a 57 y.o. female who presents today for an acute visit.    HPI: Complains of right thoracic back pain under shoulder blade x 1 week, waxes and wanes.    She was replacing decking and screwing in screws which corresponds with onset pf pain.   3 days later, she was using backpack blower for yardwork.   Painful to scratch back with right arm.   She has tried ibuprofen 400mg   once daily and also muscle rub, heat, with temporarily relief.   No fever, N , vomiting, dysuria, hematuria, constipation, pain with eating.    No h/o renal stone   She request labs to be done today versus during her upcoming lab appointment.    H/o DM, gerd  H/o appendectomy  Never smoker  Allergies: Trulicity [dulaglutide] and Januvia [sitagliptin] Current  Outpatient Medications on File Prior to Visit  Medication Sig Dispense Refill   atorvastatin (LIPITOR) 20 MG tablet TAKE 1 TABLET BY MOUTH EVERY DAY 90 tablet 1   fluticasone (FLONASE) 50 MCG/ACT nasal spray Place 2 sprays into both nostrils daily. 16 g 6   Lancet Devices (MICROLET NEXT LANCING DEVICE) MISC 1 each by Does not apply route daily. 1 each 0   losartan (COZAAR) 50 MG tablet Take 1 tablet (50 mg total) by mouth daily. 90 tablet 1   metFORMIN (GLUCOPHAGE-XR) 500 MG 24 hr tablet TAKE 1 TABLET BY MOUTH 2 TIMES DAILY WITH A MEAL. 180 tablet 1   MICROLET LANCETS MISC 1 each by Does not apply route 3 (three) times daily. 100 each 11   Multiple Vitamin (MULTIVITAMIN) tablet Take 1 tablet by mouth daily.     ONETOUCH ULTRA test strip USE AS INSTRUCTED TO TEST BLOOD SUGAR THREE TIMES DAILY - USING ONETOUCH ULTRA2 SYSTEM 100 strip 8   pantoprazole (PROTONIX) 40 MG tablet Take 1 tablet (40 mg total) by mouth daily. 90 tablet 2   Polysaccharide Iron Complex (FERREX 150 PO) Take 1 capsule by mouth daily.     Semaglutide,0.25 or 0.5MG /DOS, (OZEMPIC, 0.25 OR 0.5 MG/DOSE,) 2 MG/3ML SOPN INJECT 0.5 MG INTO THE SKIN ONE TIME PER WEEK 3 mL 1   venlafaxine XR (EFFEXOR-XR) 75 MG 24 hr capsule TAKE 1 CAPSULE BY MOUTH DAILY WITH BREAKFAST. 90 capsule 1  No current facility-administered medications on file prior to visit.    Review of Systems  Constitutional:  Negative for chills and fever.  Respiratory:  Negative for cough.   Cardiovascular:  Negative for chest pain and palpitations.  Gastrointestinal:  Negative for nausea and vomiting.  Musculoskeletal:  Positive for back pain.  Neurological:  Negative for numbness.      Objective:    BP 128/82   Pulse 78   Temp 98 F (36.7 C) (Oral)   Ht 5\' 3"  (1.6 m)   Wt 171 lb 9.6 oz (77.8 kg)   LMP 03/14/2019 (Approximate)   SpO2 97%   BMI 30.40 kg/m   BP Readings from Last 3 Encounters:  01/18/23 128/82  08/04/22 118/77  06/10/22 132/82   Wt  Readings from Last 3 Encounters:  01/18/23 171 lb 9.6 oz (77.8 kg)  08/04/22 176 lb (79.8 kg)  06/10/22 187 lb 12.8 oz (85.2 kg)    Physical Exam Vitals reviewed.  Constitutional:      Appearance: She is well-developed.  Eyes:     Conjunctiva/sclera: Conjunctivae normal.  Cardiovascular:     Rate and Rhythm: Normal rate and regular rhythm.     Pulses: Normal pulses.     Heart sounds: Normal heart sounds.  Pulmonary:     Effort: Pulmonary effort is normal.     Breath sounds: Normal breath sounds. No wheezing, rhonchi or rales.  Musculoskeletal:       Arms:     Thoracic back: Spasms and tenderness present. No swelling or bony tenderness. Normal range of motion.       Back:     Comments: Tenderness to right thoracic back with deep palpation.  No bony tenderness, bony step-off.  No rash  Skin:    General: Skin is warm and dry.  Neurological:     Mental Status: She is alert.  Psychiatric:        Speech: Speech normal.        Behavior: Behavior normal.        Thought Content: Thought content normal.

## 2023-01-20 ENCOUNTER — Encounter: Payer: Self-pay | Admitting: Family

## 2023-01-23 ENCOUNTER — Other Ambulatory Visit: Payer: BC Managed Care – PPO

## 2023-02-04 ENCOUNTER — Other Ambulatory Visit: Payer: Self-pay | Admitting: Internal Medicine

## 2023-02-06 ENCOUNTER — Encounter: Payer: Self-pay | Admitting: Internal Medicine

## 2023-02-07 ENCOUNTER — Other Ambulatory Visit: Payer: Self-pay

## 2023-02-07 MED ORDER — OZEMPIC (0.25 OR 0.5 MG/DOSE) 2 MG/3ML ~~LOC~~ SOPN: 0.5000 mg | PEN_INJECTOR | SUBCUTANEOUS | 1 refills | Status: DC

## 2023-02-07 NOTE — Telephone Encounter (Signed)
Please refuse. Sent in today in different encounter

## 2023-02-07 NOTE — Telephone Encounter (Signed)
Patient needs PA on Ozempic  

## 2023-02-09 ENCOUNTER — Telehealth: Payer: Self-pay

## 2023-02-09 ENCOUNTER — Other Ambulatory Visit (HOSPITAL_COMMUNITY): Payer: Self-pay

## 2023-02-09 NOTE — Telephone Encounter (Signed)
Pharmacy Patient Advocate Encounter   Received notification from CoverMyMeds that prior authorization for Ozempic is required/requested.   Insurance verification completed.   The patient is insured through [facility] .   Per test claim: PA required; PA submitted to [employer] via CoverMyMeds Key/confirmation #/EOC Key: [id number]       Status is pending

## 2023-02-10 ENCOUNTER — Other Ambulatory Visit (HOSPITAL_COMMUNITY): Payer: Self-pay

## 2023-02-10 NOTE — Telephone Encounter (Signed)
Pharmacy Patient Advocate Encounter  Received notification from [employer] that Prior Authorization for Ozempic (0.25 or 0.5 MG/DOSE) 2MG /3ML pen-injectors has been APPROVED from 02/09/23 to 02/08/26   PA #/Case ID/Reference #: [id number]

## 2023-02-10 NOTE — Telephone Encounter (Signed)
Notified via mychart.

## 2023-03-10 ENCOUNTER — Other Ambulatory Visit: Payer: Self-pay | Admitting: Internal Medicine

## 2023-03-10 DIAGNOSIS — Z1231 Encounter for screening mammogram for malignant neoplasm of breast: Secondary | ICD-10-CM

## 2023-03-21 ENCOUNTER — Encounter: Payer: Self-pay | Admitting: Internal Medicine

## 2023-03-22 ENCOUNTER — Telehealth: Payer: BC Managed Care – PPO | Admitting: Internal Medicine

## 2023-03-22 ENCOUNTER — Encounter: Payer: Self-pay | Admitting: Internal Medicine

## 2023-03-22 VITALS — BP 104/88 | Ht 63.0 in | Wt 172.0 lb

## 2023-03-22 DIAGNOSIS — Z7984 Long term (current) use of oral hypoglycemic drugs: Secondary | ICD-10-CM | POA: Diagnosis not present

## 2023-03-22 DIAGNOSIS — E1165 Type 2 diabetes mellitus with hyperglycemia: Secondary | ICD-10-CM | POA: Diagnosis not present

## 2023-03-22 DIAGNOSIS — I10 Essential (primary) hypertension: Secondary | ICD-10-CM

## 2023-03-22 DIAGNOSIS — Z862 Personal history of diseases of the blood and blood-forming organs and certain disorders involving the immune mechanism: Secondary | ICD-10-CM

## 2023-03-22 DIAGNOSIS — Z7985 Long-term (current) use of injectable non-insulin antidiabetic drugs: Secondary | ICD-10-CM | POA: Diagnosis not present

## 2023-03-22 DIAGNOSIS — E7800 Pure hypercholesterolemia, unspecified: Secondary | ICD-10-CM

## 2023-03-22 NOTE — Telephone Encounter (Signed)
Printed for todays appt

## 2023-03-22 NOTE — Progress Notes (Deleted)
Subjective:    Patient ID: Anne [name], female    DOB: Mar 28, 1966, 57 y.o.   MRN: [id number]  Patient here for  Chief Complaint  Patient presents with  . Medical Management of Chronic Issues    HPI    Virtual Visit via video Note  I connected withNAME@ today at  9:30 AM EDT by a video enabled telemedicine application or telephone and verified that I am speaking with the correct person using two identifiers. Location patient: home Location provider: work or home office Persons participating in the virtual visit: patient, provider  I discussed the limitations, risks, security and privacy concerns of performing an evaluation and management service by telephone and the availability of in person appointments. I also discussed with the patient that there may be a patient responsible charge related to this service. The patient expressed understanding and agreed to proceed.  Interactive audio and video telecommunications were attempted between this provider and patient, however failed, due to patient having technical difficulties OR patient did not have access to video capability.  We continued and completed visit with audio only. ***  Reason for visit: follow up appt  HPI: Follow up regarding diabetes, hypertension and hypercholesterolemia. Taking metformin.  On ozempic.    ROS: See pertinent positives and negatives per HPI.  Past Medical History:  Diagnosis Date  . Anemia   . Diabetes mellitus (HCC)   . Gastritis   . History of abnormal Pap smear    s/p LEEP, h/o HPV  . Hypercholesterolemia   . Hypertension   . IBS (irritable bowel syndrome)     Past Surgical History:  Procedure Laterality Date  . APPENDECTOMY    . BREAST BIOPSY Left 08/25/2017   left breast biopsy neg  . COLONOSCOPY WITH PROPOFOL N/A 11/28/2014   Procedure: COLONOSCOPY WITH PROPOFOL;  Surgeon: Christena Deem, MD;  Location: [facility] ENDOSCOPY;  Service: Endoscopy;  Laterality: N/A;    Family  History  Problem Relation Age of Onset  . Diabetes Paternal Grandmother   . Emphysema Paternal Grandfather   . Breast cancer Sister 8  . Colon cancer Neg Hx     SOCIAL HX: ***   Current Outpatient Medications:  .  atorvastatin (LIPITOR) 20 MG tablet, TAKE 1 TABLET BY MOUTH EVERY DAY, Disp: 90 tablet, Rfl: 1 .  cyclobenzaprine (FLEXERIL) 5 MG tablet, Take 1-2 tablets (5-10 mg total) by mouth at bedtime as needed for muscle spasms., Disp: 30 tablet, Rfl: 1 .  fluticasone (FLONASE) 50 MCG/ACT nasal spray, Place 2 sprays into both nostrils daily., Disp: 16 g, Rfl: 6 .  Lancet Devices (MICROLET NEXT LANCING DEVICE) MISC, 1 each by Does not apply route daily., Disp: 1 each, Rfl: 0 .  losartan (COZAAR) 50 MG tablet, Take 1 tablet (50 mg total) by mouth daily., Disp: 90 tablet, Rfl: 1 .  metFORMIN (GLUCOPHAGE-XR) 500 MG 24 hr tablet, TAKE 1 TABLET BY MOUTH 2 TIMES DAILY WITH A MEAL., Disp: 180 tablet, Rfl: 1 .  MICROLET LANCETS MISC, 1 each by Does not apply route 3 (three) times daily., Disp: 100 each, Rfl: 11 .  Multiple Vitamin (MULTIVITAMIN) tablet, Take 1 tablet by mouth daily., Disp: , Rfl:  .  ONETOUCH ULTRA test strip, USE AS INSTRUCTED TO TEST BLOOD SUGAR THREE TIMES DAILY - USING ONETOUCH ULTRA2 SYSTEM, Disp: 100 strip, Rfl: 8 .  pantoprazole (PROTONIX) 40 MG tablet, Take 1 tablet (40 mg total) by mouth daily., Disp: 90 tablet, Rfl: 2 .  Polysaccharide  Iron Complex (FERREX 150 PO), Take 1 capsule by mouth daily., Disp: , Rfl:  .  Semaglutide,0.25 or 0.5MG /DOS, (OZEMPIC, 0.25 OR 0.5 MG/DOSE,) 2 MG/3ML SOPN, Take 0.5 mg by mouth once a week., Disp: 3 mL, Rfl: 1 .  venlafaxine XR (EFFEXOR-XR) 75 MG 24 hr capsule, TAKE 1 CAPSULE BY MOUTH DAILY WITH BREAKFAST., Disp: 90 capsule, Rfl: 1  EXAM:  VITALS per patient if applicable:  GENERAL: alert, oriented, appears well and in no acute distress  HEENT: atraumatic, conjunttiva clear, no obvious abnormalities on inspection of external nose  and ears  NECK: normal movements of the head and neck  LUNGS: on inspection no signs of respiratory distress, breathing rate appears normal, no obvious gross SOB, gasping or wheezing  CV: no obvious cyanosis  MS: moves all visible extremities without noticeable abnormality  PSYCH/NEURO: pleasant and cooperative, no obvious depression or anxiety, speech and thought processing grossly intact  ASSESSMENT AND PLAN:  Discussed the following assessment and plan:  Problem List Items Addressed This Visit   None   No follow-ups on file.   I discussed the assessment and treatment plan with the patient. The patient was provided an opportunity to ask questions and all were answered. The patient agreed with the plan and demonstrated an understanding of the instructions.   The patient was advised to call back or seek an in-person evaluation if the symptoms worsen or if the condition fails to improve as anticipated.  I provided *** minutes of non-face-to-face time during this encounter.   Dale [place], MD    Past Medical History:  Diagnosis Date  . Anemia   . Diabetes mellitus (HCC)   . Gastritis   . History of abnormal Pap smear    s/p LEEP, h/o HPV  . Hypercholesterolemia   . Hypertension   . IBS (irritable bowel syndrome)    Past Surgical History:  Procedure Laterality Date  . APPENDECTOMY    . BREAST BIOPSY Left 08/25/2017   left breast biopsy neg  . COLONOSCOPY WITH PROPOFOL N/A 11/28/2014   Procedure: COLONOSCOPY WITH PROPOFOL;  Surgeon: Christena Deem, MD;  Location: [facility] ENDOSCOPY;  Service: Endoscopy;  Laterality: N/A;   Family History  Problem Relation Age of Onset  . Diabetes Paternal Grandmother   . Emphysema Paternal Grandfather   . Breast cancer Sister 57  . Colon cancer Neg Hx    Social History   Socioeconomic History  . Marital status: Single    Spouse name: Not on file  . Number of children: 0  . Years of education: Not on file  . Highest  education level: Associate degree: academic program  Occupational History  . Not on file  Tobacco Use  . Smoking status: Never  . Smokeless tobacco: Never  Substance and Sexual Activity  . Alcohol use: No    Alcohol/week: 0.0 standard drinks of alcohol  . Drug use: No  . Sexual activity: Not on file  Other Topics Concern  . Not on file  Social History Narrative  . Not on file   Social Determinants of Health   Financial Resource Strain: Low Risk  (01/18/2023)   Overall Financial Resource Strain (CARDIA)   . Difficulty of Paying Living Expenses: Not hard at all  Food Insecurity: No Food Insecurity (01/18/2023)   Hunger Vital Sign   . Worried About Programme researcher, broadcasting/film/video in the Last Year: Never true   . Ran Out of Food in the Last Year: Never true  Transportation Needs:  No Transportation Needs (01/18/2023)   PRAPARE - Transportation   . Lack of Transportation (Medical): No   . Lack of Transportation (Non-Medical): No  Physical Activity: Sufficiently Active (01/18/2023)   Exercise Vital Sign   . Days of Exercise per Week: 5 days   . Minutes of Exercise per Session: 30 min  Stress: No Stress Concern Present (01/18/2023)   [employer] of Occupational Health - Occupational Stress Questionnaire   . Feeling of Stress : Not at all  Social Connections: Moderately Isolated (01/18/2023)   Social Connection and Isolation Panel [NHANES]   . Frequency of Communication with Friends and Family: More than three times a week   . Frequency of Social Gatherings with Friends and Family: Twice a week   . Attends Religious Services: More than 4 times per year   . Active Member of Clubs or Organizations: No   . Attends Banker Meetings: Not on file   . Marital Status: Divorced     Review of Systems     Objective:     BP 104/88   Ht 5\' 3"  (1.6 m)   Wt 172 lb (78 kg)   LMP 03/14/2019 (Approximate)   BMI 30.47 kg/m  Wt Readings from Last 3 Encounters:  03/22/23 172 lb (78 kg)   01/18/23 171 lb 9.6 oz (77.8 kg)  08/04/22 176 lb (79.8 kg)    Physical Exam   Outpatient Encounter Medications as of 03/22/2023  Medication Sig  . atorvastatin (LIPITOR) 20 MG tablet TAKE 1 TABLET BY MOUTH EVERY DAY  . cyclobenzaprine (FLEXERIL) 5 MG tablet Take 1-2 tablets (5-10 mg total) by mouth at bedtime as needed for muscle spasms.  . fluticasone (FLONASE) 50 MCG/ACT nasal spray Place 2 sprays into both nostrils daily.  Demetra Shiner Devices (MICROLET NEXT LANCING DEVICE) MISC 1 each by Does not apply route daily.  Marland Kitchen losartan (COZAAR) 50 MG tablet Take 1 tablet (50 mg total) by mouth daily.  . metFORMIN (GLUCOPHAGE-XR) 500 MG 24 hr tablet TAKE 1 TABLET BY MOUTH 2 TIMES DAILY WITH A MEAL.  Marland Kitchen MICROLET LANCETS MISC 1 each by Does not apply route 3 (three) times daily.  . Multiple Vitamin (MULTIVITAMIN) tablet Take 1 tablet by mouth daily.  Letta Pate ULTRA test strip USE AS INSTRUCTED TO TEST BLOOD SUGAR THREE TIMES DAILY - USING ONETOUCH ULTRA2 SYSTEM  . pantoprazole (PROTONIX) 40 MG tablet Take 1 tablet (40 mg total) by mouth daily.  . Polysaccharide Iron Complex (FERREX 150 PO) Take 1 capsule by mouth daily.  . Semaglutide,0.25 or 0.5MG /DOS, (OZEMPIC, 0.25 OR 0.5 MG/DOSE,) 2 MG/3ML SOPN Take 0.5 mg by mouth once a week.  . venlafaxine XR (EFFEXOR-XR) 75 MG 24 hr capsule TAKE 1 CAPSULE BY MOUTH DAILY WITH BREAKFAST.   No facility-administered encounter medications on file as of 03/22/2023.     Lab Results  Component Value Date   WBC 4.2 01/24/2022   HGB 14.2 01/24/2022   HCT 43.1 01/24/2022   PLT 204.0 01/24/2022   GLUCOSE 133 (H) 01/18/2023   CHOL 160 09/19/2022   TRIG 229.0 (H) 09/19/2022   HDL 50.00 09/19/2022   LDLDIRECT 83.0 09/19/2022   LDLCALC 69 04/08/2019   ALT 22 01/18/2023   AST 18 01/18/2023   NA 137 01/18/2023   K 3.7 01/18/2023   CL 102 01/18/2023   CREATININE 0.78 01/18/2023   BUN 14 01/18/2023   CO2 28 01/18/2023   TSH 0.74 09/24/2021   HGBA1C 7.0 (H)  01/18/2023  MICROALBUR 2.0 (H) 01/24/2022    MM 3D SCREEN BREAST BILATERAL  Result Date: 04/19/2022 CLINICAL DATA:  Screening. EXAM: DIGITAL SCREENING BILATERAL MAMMOGRAM WITH TOMOSYNTHESIS AND CAD TECHNIQUE: Bilateral screening digital craniocaudal and mediolateral oblique mammograms were obtained. Bilateral screening digital breast tomosynthesis was performed. The images were evaluated with computer-aided detection. COMPARISON:  Previous exam(s). ACR Breast Density Category b: There are scattered areas of fibroglandular density. FINDINGS: There are no findings suspicious for malignancy. IMPRESSION: No mammographic evidence of malignancy. A result letter of this screening mammogram will be mailed directly to the patient. RECOMMENDATION: Screening mammogram in one year. (Code:SM-B-01Y) BI-RADS CATEGORY  1: Negative. Electronically Signed   By: Gerome Sam III M.D.   On: 04/19/2022 18:25       Assessment & Plan:  There are no diagnoses linked to this encounter.   Dale Millen, MD

## 2023-03-24 ENCOUNTER — Other Ambulatory Visit: Payer: Self-pay | Admitting: Internal Medicine

## 2023-03-24 NOTE — Telephone Encounter (Signed)
Please refuse. Refilled during her visit

## 2023-03-26 ENCOUNTER — Encounter: Payer: Self-pay | Admitting: Internal Medicine

## 2023-03-26 MED ORDER — PANTOPRAZOLE SODIUM 40 MG PO TBEC: 40.0000 mg | DELAYED_RELEASE_TABLET | Freq: Every day | ORAL | 2 refills | Status: DC

## 2023-03-26 MED ORDER — VENLAFAXINE HCL ER 75 MG PO CP24: 75.0000 mg | ORAL_CAPSULE | Freq: Every day | ORAL | 1 refills | Status: DC

## 2023-03-26 MED ORDER — ATORVASTATIN CALCIUM 20 MG PO TABS: 20.0000 mg | ORAL_TABLET | Freq: Every day | ORAL | 1 refills | Status: DC

## 2023-03-26 MED ORDER — METFORMIN HCL ER 500 MG PO TB24: 500.0000 mg | ORAL_TABLET | Freq: Two times a day (BID) | ORAL | 1 refills | Status: DC

## 2023-03-26 MED ORDER — LOSARTAN POTASSIUM 50 MG PO TABS: 50.0000 mg | ORAL_TABLET | Freq: Every day | ORAL | 1 refills | Status: DC

## 2023-03-26 NOTE — Progress Notes (Signed)
Patient ID: Anne [name], female   DOB: 1966/04/03, 57 y.o.   MRN: [id number]   Virtual Visit via video Note  I connected with Anne [name] by a video enabled telemedicine application and verified that I am speaking with the correct person using two identifiers. Location patient: home Location provider: work  Persons participating in the virtual visit: patient, provider  The limitations, risks, security and privacy concerns of performing an evaluation and management service by video and the availability of in person appointments have been discussed.  It has also been discussed with the patient that there may be a patient responsible charge related to this service. The patient expressed understanding and agreed to proceed.   Reason for visit: follow up appt  HPI: Follow up regarding diabetes, hypertension and hypercholesterolemia. Taking metformin.  On ozempic. Tolerating.  Reports she is doing relatively well.  Working out in her yard.  Staying active.  Reviewed outside sugar readings - attached.  Overall appear to be improved.  Discussed continued diet and exercise. No chest pain or sob reported.  No abdominal pain or bowel change reported.    ROS: See pertinent positives and negatives per HPI.  Past Medical History:  Diagnosis Date   Anemia    Diabetes mellitus (HCC)    Gastritis    History of abnormal Pap smear    s/p LEEP, h/o HPV   Hypercholesterolemia    Hypertension    IBS (irritable bowel syndrome)     Past Surgical History:  Procedure Laterality Date   APPENDECTOMY     BREAST BIOPSY Left 08/25/2017   left breast biopsy neg   COLONOSCOPY WITH PROPOFOL N/A 11/28/2014   Procedure: COLONOSCOPY WITH PROPOFOL;  Surgeon: Christena Deem, MD;  Location: [facility] ENDOSCOPY;  Service: Endoscopy;  Laterality: N/A;    Family History  Problem Relation Age of Onset   Diabetes Paternal Grandmother    Emphysema Paternal Grandfather    Breast cancer Sister 81   Colon  cancer Neg Hx     SOCIAL HX: reviewed.    Current Outpatient Medications:    atorvastatin (LIPITOR) 20 MG tablet, Take 1 tablet (20 mg total) by mouth daily., Disp: 90 tablet, Rfl: 1   cyclobenzaprine (FLEXERIL) 5 MG tablet, Take 1-2 tablets (5-10 mg total) by mouth at bedtime as needed for muscle spasms., Disp: 30 tablet, Rfl: 1   fluticasone (FLONASE) 50 MCG/ACT nasal spray, Place 2 sprays into both nostrils daily., Disp: 16 g, Rfl: 6   Lancet Devices (MICROLET NEXT LANCING DEVICE) MISC, 1 each by Does not apply route daily., Disp: 1 each, Rfl: 0   losartan (COZAAR) 50 MG tablet, Take 1 tablet (50 mg total) by mouth daily., Disp: 90 tablet, Rfl: 1   metFORMIN (GLUCOPHAGE-XR) 500 MG 24 hr tablet, Take 1 tablet (500 mg total) by mouth 2 (two) times daily with a meal., Disp: 180 tablet, Rfl: 1   MICROLET LANCETS MISC, 1 each by Does not apply route 3 (three) times daily., Disp: 100 each, Rfl: 11   Multiple Vitamin (MULTIVITAMIN) tablet, Take 1 tablet by mouth daily., Disp: , Rfl:    ONETOUCH ULTRA test strip, USE AS INSTRUCTED TO TEST BLOOD SUGAR THREE TIMES DAILY - USING ONETOUCH ULTRA2 SYSTEM, Disp: 100 strip, Rfl: 8   pantoprazole (PROTONIX) 40 MG tablet, Take 1 tablet (40 mg total) by mouth daily., Disp: 90 tablet, Rfl: 2   Polysaccharide Iron Complex (FERREX 150 PO), Take 1 capsule by mouth daily., Disp: , Rfl:  Semaglutide,0.25 or 0.5MG /DOS, (OZEMPIC, 0.25 OR 0.5 MG/DOSE,) 2 MG/3ML SOPN, Take 0.5 mg by mouth once a week., Disp: 3 mL, Rfl: 1   venlafaxine XR (EFFEXOR-XR) 75 MG 24 hr capsule, Take 1 capsule (75 mg total) by mouth daily with breakfast., Disp: 90 capsule, Rfl: 1  EXAM:  VITALS per patient if applicable: 120/81  GENERAL: alert, oriented, appears well and in no acute distress  HEENT: atraumatic, conjunttiva clear, no obvious abnormalities on inspection of external nose and ears  NECK: normal movements of the head and neck  LUNGS: on inspection no signs of respiratory  distress, breathing rate appears normal, no obvious gross SOB, gasping or wheezing  CV: no obvious cyanosis  MS: moves all visible extremities without noticeable abnormality  PSYCH/NEURO: pleasant and cooperative, no obvious depression or anxiety, speech and thought processing grossly intact  ASSESSMENT AND PLAN:  Discussed the following assessment and plan:  Problem List Items Addressed This Visit     History of anemia    Follow cbc and iron studies.       Hypercholesterolemia    On lipitor.  Continue low carb diet and exercise.  Follow lipid panel and liver function tests.        Relevant Medications   atorvastatin (LIPITOR) 20 MG tablet   losartan (COZAAR) 50 MG tablet   Hypertension    Continue losartan.  Follow pressures.  Follow metabolic panel.       Relevant Medications   atorvastatin (LIPITOR) 20 MG tablet   losartan (COZAAR) 50 MG tablet   Type 2 diabetes mellitus with hyperglycemia (HCC) - Primary    Taking metformin.  On ozempic -.5mg .  Discussed low-carb diet and exercise.  Follow met b and A1c. Has adjusted diet. Feels good.  Sugars as outlined - attached. Continue current medication regimen.       Relevant Medications   atorvastatin (LIPITOR) 20 MG tablet   losartan (COZAAR) 50 MG tablet   metFORMIN (GLUCOPHAGE-XR) 500 MG 24 hr tablet    Return if symptoms worsen or fail to improve.  keep scheduled..   I discussed the assessment and treatment plan with the patient. The patient was provided an opportunity to ask questions and all were answered. The patient agreed with the plan and demonstrated an understanding of the instructions.   The patient was advised to call back or seek an in-person evaluation if the symptoms worsen or if the condition fails to improve as anticipated.   Dale Edgerton, MD

## 2023-03-26 NOTE — Assessment & Plan Note (Signed)
Follow cbc and iron studies.  

## 2023-03-26 NOTE — Assessment & Plan Note (Signed)
On lipitor.  Continue low carb diet and exercise.  Follow lipid panel and liver function tests.

## 2023-03-26 NOTE — Assessment & Plan Note (Signed)
Taking metformin.  On ozempic -.5mg .  Discussed low-carb diet and exercise.  Follow met b and A1c. Has adjusted diet. Feels good.  Sugars as outlined - attached. Continue current medication regimen.

## 2023-03-26 NOTE — Assessment & Plan Note (Signed)
Continue losartan.  Follow pressures.  Follow metabolic panel.  

## 2023-03-28 LAB — HM DIABETES EYE EXAM

## 2023-03-31 ENCOUNTER — Other Ambulatory Visit: Payer: Self-pay | Admitting: Internal Medicine

## 2023-04-03 ENCOUNTER — Other Ambulatory Visit: Payer: Self-pay

## 2023-04-03 ENCOUNTER — Encounter: Payer: Self-pay | Admitting: Internal Medicine

## 2023-04-03 MED ORDER — OZEMPIC (0.25 OR 0.5 MG/DOSE) 2 MG/3ML ~~LOC~~ SOPN: 0.5000 mg | PEN_INJECTOR | SUBCUTANEOUS | 1 refills | Status: DC

## 2023-04-04 NOTE — Telephone Encounter (Signed)
Rx ok'd for semaglutide .5mg  q week.

## 2023-04-20 ENCOUNTER — Ambulatory Visit
Admission: RE | Admit: 2023-04-20 | Discharge: 2023-04-20 | Disposition: A | Discharge status: Home / Self Care | Payer: BC Managed Care – PPO | Source: Ambulatory Visit | Attending: Internal Medicine | Admitting: Internal Medicine

## 2023-04-20 DIAGNOSIS — Z1231 Encounter for screening mammogram for malignant neoplasm of breast: Secondary | ICD-10-CM | POA: Insufficient documentation

## 2023-06-08 ENCOUNTER — Telehealth: Payer: Self-pay | Admitting: Internal Medicine

## 2023-06-08 DIAGNOSIS — E1165 Type 2 diabetes mellitus with hyperglycemia: Secondary | ICD-10-CM

## 2023-06-08 DIAGNOSIS — E7800 Pure hypercholesterolemia, unspecified: Secondary | ICD-10-CM

## 2023-06-08 DIAGNOSIS — I10 Essential (primary) hypertension: Secondary | ICD-10-CM

## 2023-06-08 NOTE — Telephone Encounter (Signed)
 Labs ordered.

## 2023-06-08 NOTE — Telephone Encounter (Signed)
 Patient need orders

## 2023-06-13 ENCOUNTER — Other Ambulatory Visit (INDEPENDENT_AMBULATORY_CARE_PROVIDER_SITE_OTHER): Payer: 59

## 2023-06-13 DIAGNOSIS — E7800 Pure hypercholesterolemia, unspecified: Secondary | ICD-10-CM

## 2023-06-13 DIAGNOSIS — E1165 Type 2 diabetes mellitus with hyperglycemia: Secondary | ICD-10-CM | POA: Diagnosis not present

## 2023-06-13 LAB — BASIC METABOLIC PANEL
BUN: 16 mg/dL (ref 6–23)
CO2: 27 meq/L (ref 19–32)
Calcium: 9.4 mg/dL (ref 8.4–10.5)
Chloride: 107 meq/L (ref 96–112)
Creatinine, Ser: 0.8 mg/dL (ref 0.40–1.20)
GFR: 82.93 mL/min (ref >60.00)
Glucose, Bld: 152 mg/dL — ABNORMAL HIGH (ref 70–99)
Potassium: 5.1 meq/L (ref 3.5–5.1)
Sodium: 142 meq/L (ref 135–145)

## 2023-06-13 LAB — LIPID PANEL
Cholesterol: 159 mg/dL (ref 0–200)
HDL: 47.8 mg/dL (ref >39.00)
LDL Cholesterol: 75 mg/dL (ref 0–99)
NonHDL: 111.59
Total CHOL/HDL Ratio: 3
Triglycerides: 185 mg/dL — ABNORMAL HIGH (ref 0.0–149.0)
VLDL: 37 mg/dL (ref 0.0–40.0)

## 2023-06-13 LAB — HEPATIC FUNCTION PANEL
ALT: 23 U/L (ref 0–35)
AST: 19 U/L (ref 0–37)
Albumin: 4.3 g/dL (ref 3.5–5.2)
Alkaline Phosphatase: 79 U/L (ref 39–117)
Bilirubin, Direct: 0.1 mg/dL (ref 0.0–0.3)
Total Bilirubin: 0.3 mg/dL (ref 0.2–1.2)
Total Protein: 7.2 g/dL (ref 6.0–8.3)

## 2023-06-13 LAB — CBC WITH DIFFERENTIAL/PLATELET
Basophils Absolute: 0 10*3/uL (ref 0.0–0.1)
Basophils Relative: 0.8 % (ref 0.0–3.0)
Eosinophils Absolute: 0.2 10*3/uL (ref 0.0–0.7)
Eosinophils Relative: 3.6 % (ref 0.0–5.0)
HCT: 43.4 % (ref 36.0–46.0)
Hemoglobin: 14.4 g/dL (ref 12.0–15.0)
Lymphocytes Relative: 41.2 % (ref 12.0–46.0)
Lymphs Abs: 2.1 10*3/uL (ref 0.7–4.0)
MCHC: 33.1 g/dL (ref 30.0–36.0)
MCV: 93.7 fL (ref 78.0–100.0)
Monocytes Absolute: 0.4 10*3/uL (ref 0.1–1.0)
Monocytes Relative: 6.9 % (ref 3.0–12.0)
Neutro Abs: 2.5 10*3/uL (ref 1.4–7.7)
Neutrophils Relative %: 47.5 % (ref 43.0–77.0)
Platelets: 190 10*3/uL (ref 150.0–400.0)
RBC: 4.63 Mil/uL (ref 3.87–5.11)
RDW: 12.6 % (ref 11.5–15.5)
WBC: 5.2 10*3/uL (ref 4.0–10.5)

## 2023-06-13 LAB — MICROALBUMIN / CREATININE URINE RATIO
Creatinine,U: 189.1 mg/dL
Microalb Creat Ratio: 1.3 mg/g (ref 0.0–30.0)
Microalb, Ur: 2.4 mg/dL — ABNORMAL HIGH (ref 0.0–1.9)

## 2023-06-13 LAB — TSH: TSH: 0.89 u[IU]/mL (ref 0.35–5.50)

## 2023-06-13 LAB — HEMOGLOBIN A1C: Hgb A1c MFr Bld: 7.2 % — ABNORMAL HIGH (ref 4.6–6.5)

## 2023-06-15 ENCOUNTER — Encounter: Payer: Self-pay | Admitting: Internal Medicine

## 2023-06-15 ENCOUNTER — Ambulatory Visit (INDEPENDENT_AMBULATORY_CARE_PROVIDER_SITE_OTHER): Payer: 59 | Admitting: Internal Medicine

## 2023-06-15 ENCOUNTER — Other Ambulatory Visit (HOSPITAL_COMMUNITY)
Admission: RE | Admit: 2023-06-15 | Discharge: 2023-06-15 | Disposition: A | Discharge status: Home / Self Care | Payer: 59 | Source: Ambulatory Visit | Attending: Internal Medicine | Admitting: Internal Medicine

## 2023-06-15 VITALS — BP 128/78 | HR 86 | Temp 97.7°F | Ht 63.0 in | Wt 172.4 lb

## 2023-06-15 DIAGNOSIS — Z862 Personal history of diseases of the blood and blood-forming organs and certain disorders involving the immune mechanism: Secondary | ICD-10-CM

## 2023-06-15 DIAGNOSIS — Z7985 Long-term (current) use of injectable non-insulin antidiabetic drugs: Secondary | ICD-10-CM

## 2023-06-15 DIAGNOSIS — Z7984 Long term (current) use of oral hypoglycemic drugs: Secondary | ICD-10-CM

## 2023-06-15 DIAGNOSIS — E1165 Type 2 diabetes mellitus with hyperglycemia: Secondary | ICD-10-CM | POA: Diagnosis not present

## 2023-06-15 DIAGNOSIS — E7800 Pure hypercholesterolemia, unspecified: Secondary | ICD-10-CM | POA: Diagnosis not present

## 2023-06-15 DIAGNOSIS — Z1231 Encounter for screening mammogram for malignant neoplasm of breast: Secondary | ICD-10-CM

## 2023-06-15 DIAGNOSIS — I10 Essential (primary) hypertension: Secondary | ICD-10-CM

## 2023-06-15 DIAGNOSIS — Z8619 Personal history of other infectious and parasitic diseases: Secondary | ICD-10-CM

## 2023-06-15 DIAGNOSIS — Z1151 Encounter for screening for human papillomavirus (HPV): Secondary | ICD-10-CM

## 2023-06-15 DIAGNOSIS — Z0000 Encounter for general adult medical examination without abnormal findings: Secondary | ICD-10-CM | POA: Diagnosis not present

## 2023-06-15 MED ORDER — ATORVASTATIN CALCIUM 20 MG PO TABS: 20.0000 mg | ORAL_TABLET | Freq: Every day | ORAL | 1 refills | Status: DC

## 2023-06-15 MED ORDER — VENLAFAXINE HCL ER 75 MG PO CP24: 75.0000 mg | ORAL_CAPSULE | Freq: Every day | ORAL | 1 refills | Status: DC

## 2023-06-15 MED ORDER — LOSARTAN POTASSIUM 100 MG PO TABS
100.0000 mg | ORAL_TABLET | Freq: Every day | ORAL | 3 refills | Status: DC
Start: 2023-06-15 — End: 2024-04-29

## 2023-06-15 MED ORDER — METFORMIN HCL ER 500 MG PO TB24: 500.0000 mg | ORAL_TABLET | Freq: Two times a day (BID) | ORAL | 1 refills | Status: DC

## 2023-06-15 MED ORDER — SEMAGLUTIDE (1 MG/DOSE) 4 MG/3ML ~~LOC~~ SOPN: 1.0000 mg | PEN_INJECTOR | SUBCUTANEOUS | 1 refills | Status: DC

## 2023-06-15 MED ORDER — PANTOPRAZOLE SODIUM 40 MG PO TBEC: 40.0000 mg | DELAYED_RELEASE_TABLET | Freq: Every day | ORAL | 2 refills | Status: DC

## 2023-06-15 NOTE — Progress Notes (Signed)
 Subjective:    Patient ID: Anne [name], female    DOB: [birth year], 58 y.o.   MRN: [id number]  Patient here for  Chief Complaint  Patient presents with   Annual Exam    HPI Here for a physical exam. Taking metformin . Also on ozempic . Tolerating. No swallowing problems. No bowel problems reported. Tries to stay active. Not as active in cold weather - cannot get outside. No chest pain or sob reported. No cough or congestion reported. Discussed labs. A1c 7.2. discussed increasing ozempic  dose - 1mg .    Past Medical History:  Diagnosis Date   Anemia    Diabetes mellitus (HCC)    Gastritis    History of abnormal Pap smear    s/p LEEP, h/o HPV   Hypercholesterolemia    Hypertension    IBS (irritable bowel syndrome)    Past Surgical History:  Procedure Laterality Date   APPENDECTOMY     BREAST BIOPSY Left 08/25/2017   left breast biopsy neg   COLONOSCOPY WITH PROPOFOL  N/A 11/28/2014   Procedure: COLONOSCOPY WITH PROPOFOL ;  Surgeon: Gladis RAYMOND Mariner, MD;  Location: [facility] ENDOSCOPY;  Service: Endoscopy;  Laterality: N/A;   Family History  Problem Relation Age of Onset   Diabetes Paternal Grandmother    Emphysema Paternal Grandfather    Breast cancer Sister 75   Colon cancer Neg Hx    Social History   Socioeconomic History   Marital status: Single    Spouse name: Not on file   Number of children: 0   Years of education: Not on file   Highest education level: Associate degree: academic program  Occupational History   Not on file  Tobacco Use   Smoking status: Never   Smokeless tobacco: Never  Substance and Sexual Activity   Alcohol use: No    Alcohol/week: 0.0 standard drinks of alcohol   Drug use: No   Sexual activity: Not on file  Other Topics Concern   Not on file  Social History Narrative   Not on file   Social Drivers of Health   Financial Resource Strain: Low Risk  (01/18/2023)   Overall Financial Resource Strain (CARDIA)    Difficulty of Paying  Living Expenses: Not hard at all  Food Insecurity: No Food Insecurity (01/18/2023)   Hunger Vital Sign    Worried About Running Out of Food in the Last Year: Never true    Ran Out of Food in the Last Year: Never true  Transportation Needs: No Transportation Needs (01/18/2023)   PRAPARE - Administrator, Civil Service (Medical): No    Lack of Transportation (Non-Medical): No  Physical Activity: Sufficiently Active (01/18/2023)   Exercise Vital Sign    Days of Exercise per Week: 5 days    Minutes of Exercise per Session: 30 min  Stress: No Stress Concern Present (01/18/2023)   [employer] of Occupational Health - Occupational Stress Questionnaire    Feeling of Stress : Not at all  Social Connections: Moderately Isolated (01/18/2023)   Social Connection and Isolation Panel [NHANES]    Frequency of Communication with Friends and Family: More than three times a week    Frequency of Social Gatherings with Friends and Family: Twice a week    Attends Religious Services: More than 4 times per year    Active Member of [employer] or Organizations: No    Attends Engineer, Structural: Not on file    Marital Status: Divorced     Review  of Systems  Constitutional:  Negative for appetite change and unexpected weight change.  HENT:  Negative for congestion, sinus pressure and sore throat.   Eyes:  Negative for pain and visual disturbance.  Respiratory:  Negative for cough, chest tightness and shortness of breath.   Cardiovascular:  Negative for chest pain and palpitations.  Gastrointestinal:  Negative for abdominal pain, diarrhea, nausea and vomiting.  Genitourinary:  Negative for difficulty urinating and dysuria.  Musculoskeletal:  Negative for joint swelling and myalgias.  Skin:  Negative for color change and rash.  Neurological:  Negative for dizziness and headaches.  Hematological:  Negative for adenopathy. Does not bruise/bleed easily.  Psychiatric/Behavioral:  Negative for  agitation and dysphoric mood.        Objective:     BP 128/78   Pulse 86   Temp 97.7 F (36.5 C) (Oral)   Ht 5' 3 (1.6 m)   Wt 172 lb 6.4 oz (78.2 kg)   LMP 03/14/2019 (Approximate)   SpO2 98%   BMI 30.54 kg/m  Wt Readings from Last 3 Encounters:  06/15/23 172 lb 6.4 oz (78.2 kg)  03/22/23 172 lb (78 kg)  01/18/23 171 lb 9.6 oz (77.8 kg)    Physical Exam Vitals reviewed.  Constitutional:      General: She is not in acute distress.    Appearance: Normal appearance. She is well-developed.  HENT:     Head: Normocephalic and atraumatic.     Right Ear: External ear normal.     Left Ear: External ear normal.     Mouth/Throat:     Pharynx: No oropharyngeal exudate or posterior oropharyngeal erythema.  Eyes:     General: No scleral icterus.       Right eye: No discharge.        Left eye: No discharge.     Conjunctiva/sclera: Conjunctivae normal.  Neck:     Thyroid : No thyromegaly.  Cardiovascular:     Rate and Rhythm: Normal rate and regular rhythm.  Pulmonary:     Effort: No tachypnea, accessory muscle usage or respiratory distress.     Breath sounds: Normal breath sounds. No decreased breath sounds or wheezing.  Chest:  Breasts:    Right: No inverted nipple, mass, nipple discharge or tenderness (no axillary adenopathy).     Left: No inverted nipple, mass, nipple discharge or tenderness (no axilarry adenopathy).  Abdominal:     General: Bowel sounds are normal.     Palpations: Abdomen is soft.     Tenderness: There is no abdominal tenderness.  Genitourinary:    Comments: Normal external genitalia.  Vaginal vault without lesions.  Cervix identified.  Pap smear performed.  Could not appreciate any adnexal masses or tenderness.   Musculoskeletal:        General: No swelling or tenderness.     Cervical back: Neck supple.  Lymphadenopathy:     Cervical: No cervical adenopathy.  Skin:    Findings: No erythema or rash.  Neurological:     Mental Status: She is alert  and oriented to person, place, and time.  Psychiatric:        Mood and Affect: Mood normal.        Behavior: Behavior normal.      Outpatient Encounter Medications as of 06/15/2023  Medication Sig   cyclobenzaprine  (FLEXERIL ) 5 MG tablet Take 1-2 tablets (5-10 mg total) by mouth at bedtime as needed for muscle spasms.   fluticasone  (FLONASE ) 50 MCG/ACT nasal spray Place 2 sprays  into both nostrils daily.   Lancet Devices (MICROLET NEXT LANCING DEVICE) MISC 1 each by Does not apply route daily.   losartan  (COZAAR ) 100 MG tablet Take 1 tablet (100 mg total) by mouth daily.   MICROLET LANCETS MISC 1 each by Does not apply route 3 (three) times daily.   Multiple Vitamin (MULTIVITAMIN) tablet Take 1 tablet by mouth daily.   ONETOUCH ULTRA test strip USE AS INSTRUCTED TO TEST BLOOD SUGAR THREE TIMES DAILY - USING ONETOUCH ULTRA2 SYSTEM   Polysaccharide Iron Complex (FERREX 150 PO) Take 1 capsule by mouth daily.   Semaglutide , 1 MG/DOSE, 4 MG/3ML SOPN Inject 1 mg as directed once a week.   [DISCONTINUED] losartan  (COZAAR ) 50 MG tablet Take 1 tablet (50 mg total) by mouth daily.   [DISCONTINUED] Semaglutide ,0.25 or 0.5MG /DOS, (OZEMPIC , 0.25 OR 0.5 MG/DOSE,) 2 MG/3ML SOPN Take 0.5 mg by mouth once a week.   atorvastatin  (LIPITOR) 20 MG tablet Take 1 tablet (20 mg total) by mouth daily.   metFORMIN  (GLUCOPHAGE -XR) 500 MG 24 hr tablet Take 1 tablet (500 mg total) by mouth 2 (two) times daily with a meal.   pantoprazole  (PROTONIX ) 40 MG tablet Take 1 tablet (40 mg total) by mouth daily.   venlafaxine  XR (EFFEXOR -XR) 75 MG 24 hr capsule Take 1 capsule (75 mg total) by mouth daily with breakfast.   [DISCONTINUED] atorvastatin  (LIPITOR) 20 MG tablet Take 1 tablet (20 mg total) by mouth daily.   [DISCONTINUED] metFORMIN  (GLUCOPHAGE -XR) 500 MG 24 hr tablet Take 1 tablet (500 mg total) by mouth 2 (two) times daily with a meal.   [DISCONTINUED] pantoprazole  (PROTONIX ) 40 MG tablet Take 1 tablet (40 mg total)  by mouth daily.   [DISCONTINUED] Semaglutide ,0.25 or 0.5MG /DOS, (OZEMPIC , 0.25 OR 0.5 MG/DOSE,) 2 MG/3ML SOPN INJECT 0.5MG  ONCE A WEEK.   [DISCONTINUED] venlafaxine  XR (EFFEXOR -XR) 75 MG 24 hr capsule Take 1 capsule (75 mg total) by mouth daily with breakfast.   No facility-administered encounter medications on file as of 06/15/2023.     Lab Results  Component Value Date   WBC 5.2 06/13/2023   HGB 14.4 06/13/2023   HCT 43.4 06/13/2023   PLT 190.0 06/13/2023   GLUCOSE 152 (H) 06/13/2023   CHOL 159 06/13/2023   TRIG 185.0 (H) 06/13/2023   HDL 47.80 06/13/2023   LDLDIRECT 83.0 09/19/2022   LDLCALC 75 06/13/2023   ALT 23 06/13/2023   AST 19 06/13/2023   NA 142 06/13/2023   K 5.1 06/13/2023   CL 107 06/13/2023   CREATININE 0.80 06/13/2023   BUN 16 06/13/2023   CO2 27 06/13/2023   TSH 0.89 06/13/2023   HGBA1C 7.2 (H) 06/13/2023   MICROALBUR 2.4 (H) 06/13/2023    MM 3D SCREENING MAMMOGRAM BILATERAL BREAST Result Date: 04/21/2023 CLINICAL DATA:  Screening. EXAM: DIGITAL SCREENING BILATERAL MAMMOGRAM WITH TOMOSYNTHESIS AND CAD TECHNIQUE: Bilateral screening digital craniocaudal and mediolateral oblique mammograms were obtained. Bilateral screening digital breast tomosynthesis was performed. The images were evaluated with computer-aided detection. COMPARISON:  Previous exam(s). ACR Breast Density Category b: There are scattered areas of fibroglandular density. FINDINGS: There are no findings suspicious for malignancy. IMPRESSION: No mammographic evidence of malignancy. A result letter of this screening mammogram will be mailed directly to the patient. RECOMMENDATION: Screening mammogram in one year. (Code:[id number]-B-01Y) BI-RADS CATEGORY  1: Negative. Electronically Signed   By: Rosaline Collet M.D.   On: 04/21/2023 16:12       Assessment & Plan:  Health care maintenance Assessment & Plan: Physical  today 06/15/23.  PAP 05/25/21 - negative with negative HPV. F/u pap today. Mammogram 04/20/23 -  Birads I.  Colonoscopy 03/2020 - f/u 5 years.    Type 2 diabetes mellitus with hyperglycemia, without long-term current use of insulin (HCC) Assessment & Plan: Taking metformin .  On ozempic  -.5mg .  Discussed low-carb diet and exercise.  Follow met b and A1c. Has adjusted diet. Feels good.  Sugars as outlined - attached. Will increase ozempic  to 1mg  q week.  Follow.    Encounter for screening mammogram for malignant neoplasm of breast -     3D Screening Mammogram, Left and Right; Future  Encounter for screening for human papillomavirus (HPV) -     Cytology - PAP  Primary hypertension Assessment & Plan: Continue losartan .  Follow pressures.  Follow metabolic panel.    Hypercholesterolemia Assessment & Plan: On lipitor.  Continue low carb diet and exercise.  Follow lipid panel and liver function tests.     History of HPV infection Assessment & Plan: Status post colposcopy (Dr. Connell) 2015.  Pap 11/2016-negative with negative HPV.  Follow-up Pap November 2020 negative with negative HPV. Repeat 05/25/21 - negative with negative HPV.  Repeat pap today.    History of anemia Assessment & Plan: Follow cbc and iron studies.    Other orders -     Atorvastatin  Calcium ; Take 1 tablet (20 mg total) by mouth daily.  Dispense: 90 tablet; Refill: 1 -     metFORMIN  HCl ER; Take 1 tablet (500 mg total) by mouth 2 (two) times daily with a meal.  Dispense: 180 tablet; Refill: 1 -     Pantoprazole  Sodium; Take 1 tablet (40 mg total) by mouth daily.  Dispense: 90 tablet; Refill: 2 -     Venlafaxine  HCl ER; Take 1 capsule (75 mg total) by mouth daily with breakfast.  Dispense: 90 capsule; Refill: 1 -     Losartan  Potassium; Take 1 tablet (100 mg total) by mouth daily.  Dispense: 90 tablet; Refill: 3 -     Semaglutide  (1 MG/DOSE); Inject 1 mg as directed once a week.  Dispense: 9 mL; Refill: 1     Allena Hamilton, MD

## 2023-06-15 NOTE — Telephone Encounter (Signed)
 Printed copy for today's visit.

## 2023-06-15 NOTE — Assessment & Plan Note (Addendum)
 Physical today 06/15/23.  PAP 05/25/21 - negative with negative HPV. F/u pap today. Mammogram 04/20/23 - Birads I.  Colonoscopy 03/2020 - f/u 5 years.

## 2023-06-18 ENCOUNTER — Encounter: Payer: Self-pay | Admitting: Internal Medicine

## 2023-06-18 NOTE — Assessment & Plan Note (Signed)
 Status post colposcopy (Dr. Valentino Saxon) 2015.  Pap 11/2016-negative with negative HPV.  Follow-up Pap November 2020 negative with negative HPV. Repeat 05/25/21 - negative with negative HPV.  Repeat pap today.

## 2023-06-18 NOTE — Assessment & Plan Note (Signed)
 Follow cbc and iron studies.

## 2023-06-18 NOTE — Assessment & Plan Note (Signed)
On lipitor.  Continue low carb diet and exercise.  Follow lipid panel and liver function tests.

## 2023-06-18 NOTE — Assessment & Plan Note (Signed)
Continue losartan.  Follow pressures.  Follow metabolic panel.  

## 2023-06-18 NOTE — Assessment & Plan Note (Signed)
 Taking metformin.  On ozempic -.5mg .  Discussed low-carb diet and exercise.  Follow met b and A1c. Has adjusted diet. Feels good.  Sugars as outlined - attached. Will increase ozempic to 1mg  q week.  Follow.

## 2023-06-19 ENCOUNTER — Encounter: Payer: Self-pay | Admitting: Internal Medicine

## 2023-06-19 LAB — CYTOLOGY - PAP
Comment: NEGATIVE
Diagnosis: NEGATIVE
High risk HPV: NEGATIVE

## 2023-06-20 ENCOUNTER — Ambulatory Visit: Payer: 59 | Admitting: Dermatology

## 2023-06-20 DIAGNOSIS — W908XXA Exposure to other nonionizing radiation, initial encounter: Secondary | ICD-10-CM | POA: Diagnosis not present

## 2023-06-20 DIAGNOSIS — Z1283 Encounter for screening for malignant neoplasm of skin: Secondary | ICD-10-CM

## 2023-06-20 DIAGNOSIS — L814 Other melanin hyperpigmentation: Secondary | ICD-10-CM | POA: Diagnosis not present

## 2023-06-20 DIAGNOSIS — L280 Lichen simplex chronicus: Secondary | ICD-10-CM

## 2023-06-20 DIAGNOSIS — D229 Melanocytic nevi, unspecified: Secondary | ICD-10-CM

## 2023-06-20 DIAGNOSIS — L578 Other skin changes due to chronic exposure to nonionizing radiation: Secondary | ICD-10-CM

## 2023-06-20 DIAGNOSIS — D2239 Melanocytic nevi of other parts of face: Secondary | ICD-10-CM

## 2023-06-20 DIAGNOSIS — D225 Melanocytic nevi of trunk: Secondary | ICD-10-CM

## 2023-06-20 DIAGNOSIS — D1801 Hemangioma of skin and subcutaneous tissue: Secondary | ICD-10-CM

## 2023-06-20 DIAGNOSIS — L821 Other seborrheic keratosis: Secondary | ICD-10-CM

## 2023-06-20 DIAGNOSIS — L281 Prurigo nodularis: Secondary | ICD-10-CM

## 2023-06-20 MED ORDER — CLOBETASOL PROPIONATE 0.05 % EX OINT: TOPICAL_OINTMENT | CUTANEOUS | 0 refills | Status: AC

## 2023-06-20 NOTE — Patient Instructions (Signed)

## 2023-06-20 NOTE — Progress Notes (Signed)
 New Patient Visit   Subjective  Anne [name] is a 58 y.o. female who presents for the following: Skin Cancer Screening and Full Body Skin Exam  The patient presents for Total-Body Skin Exam (TBSE) for skin cancer screening and mole check. The patient has spots, moles and lesions to be evaluated, some may be new or changing. She has areas on her arms that she picks at, and uses Vaseline to these areas. Not itchy.  No history of skin cancer. Patient was last seen here around 2018.    The following portions of the chart were reviewed this encounter and updated as appropriate: medications, allergies, medical history  Review of Systems:  No other skin or systemic complaints except as noted in HPI or Assessment and Plan.  Objective  Well appearing patient in no apparent distress; mood and affect are within normal limits.  A full examination was performed including scalp, head, eyes, ears, nose, lips, neck, chest, axillae, abdomen, back, buttocks, bilateral upper extremities, bilateral lower extremities, hands, feet, fingers, toes, fingernails, and toenails. All findings within normal limits unless otherwise noted below.   Relevant physical exam findings are noted in the Assessment and Plan.    Assessment & Plan   SKIN CANCER SCREENING PERFORMED TODAY.  ACTINIC DAMAGE - Chronic condition, secondary to cumulative UV/sun exposure - diffuse scaly erythematous macules with underlying dyspigmentation - Recommend daily broad spectrum sunscreen SPF 30+ to sun-exposed areas, reapply every 2 hours as needed.  - Staying in the shade or wearing long sleeves, sun glasses (UVA+UVB protection) and wide brim hats (4-inch brim around the entire circumference of the hat) are also recommended for sun protection.  - Call for new or changing lesions.  LENTIGINES, HEMANGIOMAS - Benign normal skin lesions - Benign-appearing - Call for any changes  SEBORRHEIC KERATOSIS - Stuck-on, waxy,  tan-brown papules and/or plaques, including left thigh, right calf, left calf  - Benign-appearing - Discussed benign etiology and prognosis. - Observe - Call for any changes   MELANOCYTIC NEVI - Tan-brown and/or pink-flesh-colored symmetric macules and papules - 7.0 mm flesh papule at left cheek, discussed removal if bothersome - 3.0 mm brown macule left umbilicus - 2.0 mm brown macule right umbilicus - Benign appearing on exam today - Observation - Call clinic for new or changing moles - Recommend daily use of broad spectrum spf 30+ sunscreen to sun-exposed areas.   PRURIGO NODULARIS/LICHEN SIMPLEX CHRONICUS Exam: Excoriated papules and nodules at arms  Chronic and persistent condition with duration or expected duration over one year. Condition is bothersome/symptomatic for patient. Currently flared.   Lichen simplex chronicus [facility]) is a persistent itchy area of thickened skin that is induced by chronic rubbing and/or scratching (chronic dermatitis).  These areas may be pink, hyperpigmented and may have excoriations and bumps (prurigo nodules-PN).  PN/LSC is commonly observed in uncontrolled atopic dermatitis and other forms of eczema, and in other itchy skin conditions (eg, insect bites, scabies).  Sometimes it is not possible to know initial cause of PN/LSC if it has been present for a long time.  It generally responds well to treatment with high potency topical steroids.  It is important to stop rubbing/scratching the area in order to break the itch-scratch-rash-itch cycle, in order for the rash to resolve.   Treatment Plan: Start clobetasol  ointment Spot tx AA bumps twice daily until improved 30 g. Avoid applying to face, groin, and axilla. Use as directed. Long-term use can cause thinning of the skin.  Avoid picking/rubbing/scratching  Return in about 1 year (around 06/19/2024) for TBSE.  IAndrea Kerns, CMA, am acting as scribe for Rexene Rattler, MD .   Documentation: I  have reviewed the above documentation for accuracy and completeness, and I agree with the above.  Rexene Rattler, MD

## 2023-09-12 ENCOUNTER — Encounter: Payer: Self-pay | Admitting: Internal Medicine

## 2023-09-12 NOTE — Telephone Encounter (Signed)
 Hold for appt 09/21/23.

## 2023-09-15 ENCOUNTER — Other Ambulatory Visit: Payer: Self-pay

## 2023-09-15 DIAGNOSIS — E1165 Type 2 diabetes mellitus with hyperglycemia: Secondary | ICD-10-CM

## 2023-09-15 DIAGNOSIS — E7800 Pure hypercholesterolemia, unspecified: Secondary | ICD-10-CM

## 2023-09-19 ENCOUNTER — Other Ambulatory Visit (INDEPENDENT_AMBULATORY_CARE_PROVIDER_SITE_OTHER): Payer: 59

## 2023-09-19 DIAGNOSIS — E1165 Type 2 diabetes mellitus with hyperglycemia: Secondary | ICD-10-CM | POA: Diagnosis not present

## 2023-09-19 DIAGNOSIS — E7800 Pure hypercholesterolemia, unspecified: Secondary | ICD-10-CM | POA: Diagnosis not present

## 2023-09-19 LAB — BASIC METABOLIC PANEL WITH GFR
BUN: 13 mg/dL (ref 6–23)
CO2: 28 meq/L (ref 19–32)
Calcium: 9.4 mg/dL (ref 8.4–10.5)
Chloride: 105 meq/L (ref 96–112)
Creatinine, Ser: 0.78 mg/dL (ref 0.40–1.20)
GFR: 85.33 mL/min (ref >60.00)
Glucose, Bld: 163 mg/dL — ABNORMAL HIGH (ref 70–99)
Potassium: 4.8 meq/L (ref 3.5–5.1)
Sodium: 139 meq/L (ref 135–145)

## 2023-09-19 LAB — HEPATIC FUNCTION PANEL
ALT: 23 U/L (ref 0–35)
AST: 18 U/L (ref 0–37)
Albumin: 4.4 g/dL (ref 3.5–5.2)
Alkaline Phosphatase: 69 U/L (ref 39–117)
Bilirubin, Direct: 0 mg/dL (ref 0.0–0.3)
Total Bilirubin: 0.5 mg/dL (ref 0.2–1.2)
Total Protein: 7 g/dL (ref 6.0–8.3)

## 2023-09-19 LAB — LIPID PANEL
Cholesterol: 160 mg/dL (ref 0–200)
HDL: 46.4 mg/dL (ref >39.00)
LDL Cholesterol: 77 mg/dL (ref 0–99)
NonHDL: 113.42
Total CHOL/HDL Ratio: 3
Triglycerides: 183 mg/dL — ABNORMAL HIGH (ref 0.0–149.0)
VLDL: 36.6 mg/dL (ref 0.0–40.0)

## 2023-09-19 LAB — HEMOGLOBIN A1C: Hgb A1c MFr Bld: 6.5 % (ref 4.6–6.5)

## 2023-09-21 ENCOUNTER — Telehealth: Payer: Self-pay

## 2023-09-21 ENCOUNTER — Telehealth (INDEPENDENT_AMBULATORY_CARE_PROVIDER_SITE_OTHER): Payer: 59 | Admitting: Internal Medicine

## 2023-09-21 VITALS — BP 109/83 | Ht 63.0 in | Wt 158.0 lb

## 2023-09-21 DIAGNOSIS — E1165 Type 2 diabetes mellitus with hyperglycemia: Secondary | ICD-10-CM | POA: Diagnosis not present

## 2023-09-21 DIAGNOSIS — K219 Gastro-esophageal reflux disease without esophagitis: Secondary | ICD-10-CM

## 2023-09-21 DIAGNOSIS — I10 Essential (primary) hypertension: Secondary | ICD-10-CM | POA: Diagnosis not present

## 2023-09-21 DIAGNOSIS — Z8619 Personal history of other infectious and parasitic diseases: Secondary | ICD-10-CM

## 2023-09-21 DIAGNOSIS — Z7985 Long-term (current) use of injectable non-insulin antidiabetic drugs: Secondary | ICD-10-CM

## 2023-09-21 DIAGNOSIS — E7800 Pure hypercholesterolemia, unspecified: Secondary | ICD-10-CM

## 2023-09-21 DIAGNOSIS — R112 Nausea with vomiting, unspecified: Secondary | ICD-10-CM

## 2023-09-21 DIAGNOSIS — Z7984 Long term (current) use of oral hypoglycemic drugs: Secondary | ICD-10-CM

## 2023-09-21 DIAGNOSIS — Z862 Personal history of diseases of the blood and blood-forming organs and certain disorders involving the immune mechanism: Secondary | ICD-10-CM

## 2023-09-21 DIAGNOSIS — Z83719 Family history of colon polyps, unspecified: Secondary | ICD-10-CM | POA: Diagnosis not present

## 2023-09-21 NOTE — Progress Notes (Signed)
 Patient ID: Anne [name], female   DOB: 11/25/1965, 58 y.o.   MRN: [id number]   Virtual Visit via video Note  I connected with Anne [name] by a video enabled telemedicine application and verified that I am speaking with the correct person using two identifiers. Location patient: home Location provider: work  Persons participating in the virtual visit: patient, provider  The limitations, risks, security and privacy concerns of performing an evaluation and management service by video and the availability of in person appointments have been discussed. It has also been discussed with the patient that there may be a patient responsible charge related to this service. The patient expressed understanding and agreed to proceed.   Reason for visit: follow up appt.   HPI: Follow up regarding hypercholesterolemia, diabetes and hypertension. Continues on ozempic  and metformin . Has lost weight. Overall sugar control improved. Last visit, ozempic  dose was increased to 1mg  and losartan  dose was increased.  After starting the 1mg  dose and increasing losartan , she noticed some intermittent episodes of nausea/vomiting. She had been taking her injection on Friday. Recently changed to taking Sunday night. Was questioning if symptoms could be from working and getting hot, etc. No chest pain or sob reported. She is eating - states sometimes having to force herself to eat. Previously noticed some light headedness when getting up too quick. This has improved.    ROS: See pertinent positives and negatives per HPI.  Past Medical History:  Diagnosis Date   Anemia    Diabetes mellitus (HCC)    Gastritis    History of abnormal Pap smear    s/p LEEP, h/o HPV   Hypercholesterolemia    Hypertension    IBS (irritable bowel syndrome)     Past Surgical History:  Procedure Laterality Date   APPENDECTOMY     BREAST BIOPSY Left 08/25/2017   left breast biopsy neg   COLONOSCOPY WITH PROPOFOL  N/A 11/28/2014    Procedure: COLONOSCOPY WITH PROPOFOL ;  Surgeon: Deveron Fly, MD;  Location: [facility] ENDOSCOPY;  Service: Endoscopy;  Laterality: N/A;    Family History  Problem Relation Age of Onset   Diabetes Paternal Grandmother    Emphysema Paternal Grandfather    Breast cancer Sister 30   Colon cancer Neg Hx     SOCIAL HX: reviewed.    Current Outpatient Medications:    atorvastatin  (LIPITOR) 20 MG tablet, Take 1 tablet (20 mg total) by mouth daily., Disp: 90 tablet, Rfl: 1   clobetasol  ointment (TEMOVATE ) 0.05 %, Spot treat affected areas bumps twice daily until improved. Avoid applying to face, groin, and axilla. Use as directed. Long-term use can cause thinning of the skin., Disp: 30 g, Rfl: 0   cyclobenzaprine  (FLEXERIL ) 5 MG tablet, Take 1-2 tablets (5-10 mg total) by mouth at bedtime as needed for muscle spasms., Disp: 30 tablet, Rfl: 1   fluticasone  (FLONASE ) 50 MCG/ACT nasal spray, Place 2 sprays into both nostrils daily., Disp: 16 g, Rfl: 6   Lancet Devices (MICROLET NEXT LANCING DEVICE) MISC, 1 each by Does not apply route daily., Disp: 1 each, Rfl: 0   losartan  (COZAAR ) 100 MG tablet, Take 1 tablet (100 mg total) by mouth daily., Disp: 90 tablet, Rfl: 3   metFORMIN  (GLUCOPHAGE -XR) 500 MG 24 hr tablet, Take 1 tablet (500 mg total) by mouth 2 (two) times daily with a meal., Disp: 180 tablet, Rfl: 1   MICROLET LANCETS MISC, 1 each by Does not apply route 3 (three) times daily., Disp: 100 each, Rfl:  11   Multiple Vitamin (MULTIVITAMIN) tablet, Take 1 tablet by mouth daily., Disp: , Rfl:    ONETOUCH ULTRA test strip, USE AS INSTRUCTED TO TEST BLOOD SUGAR THREE TIMES DAILY - USING ONETOUCH ULTRA2 SYSTEM, Disp: 100 strip, Rfl: 8   pantoprazole  (PROTONIX ) 40 MG tablet, Take 1 tablet (40 mg total) by mouth daily., Disp: 90 tablet, Rfl: 2   Polysaccharide Iron Complex (FERREX 150 PO), Take 1 capsule by mouth daily., Disp: , Rfl:    Semaglutide , 1 MG/DOSE, 4 MG/3ML SOPN, Inject 1 mg as directed  once a week., Disp: 9 mL, Rfl: 1   venlafaxine  XR (EFFEXOR -XR) 75 MG 24 hr capsule, Take 1 capsule (75 mg total) by mouth daily with breakfast., Disp: 90 capsule, Rfl: 1  EXAM:  GENERAL: alert, oriented, appears well and in no acute distress  HEENT: atraumatic, conjunttiva clear, no obvious abnormalities on inspection of external nose and ears  NECK: normal movements of the head and neck  LUNGS: on inspection no signs of respiratory distress, breathing rate appears normal, no obvious gross SOB, gasping or wheezing  CV: no obvious cyanosis  PSYCH/NEURO: pleasant and cooperative, no obvious depression or anxiety, speech and thought processing grossly intact  ASSESSMENT AND PLAN:  Discussed the following assessment and plan:  Problem List Items Addressed This Visit     Family history of colonic polyps    Colonoscopy 03/19/20 - one polyp (hepatic flexure), one 3mm polyp (sigmoid colon), diverticulosis  - pathology (polypoid colonic mucosa with benign lymphoid aggregate and polypoid colonic mucosa with mild edema of the lamina propria.  Recommended f/u colonoscopy in 5 years.       GERD (gastroesophageal reflux disease)   Will decrease ozempic  dose as outlined. Follow symptoms. Call with update.       History of anemia   Last colonoscopy 03/2020. Follow cbc and iron studies.       History of HPV infection   Status post colposcopy (Dr. Denman Fischer) 2015.  Pap 11/2016-negative with negative HPV.  Follow-up Pap November 2020 negative with negative HPV. Repeat 05/25/21 - negative with negative HPV.  Repeat 06/15/23 - negative with negative HPV.       Hypercholesterolemia   On lipitor.  Continue low carb diet and exercise.  Follow lipid panel and liver function tests.   Lab Results  Component Value Date   CHOL 160 09/19/2023   HDL 46.40 09/19/2023   LDLCALC 77 09/19/2023   LDLDIRECT 83.0 09/19/2022   TRIG 183.0 (H) 09/19/2023   CHOLHDL 3 09/19/2023         Hypertension   Continue  current losartan  dose. Reviewed outside blood pressures.  Follow pressures. Follow metabolic panel.       Nausea & vomiting   Intermittent episodes of nausea and vomiting as outlined. Adjust ozempic  dose as outlined. Follow symptoms. Further w/up pending response.       Type 2 diabetes mellitus with hyperglycemia (HCC) - Primary   Currently on metformin  and 1mg  ozempic . Discussed current intermittent GI issues/symptoms as outlined. Appears to have started after increasing ozempic  dose. Will decrease ozempic  to .5mg  weekly. Follow symptoms. Continue to stay hydrated. Discussed need to get adequate protein in diet. Follow.  Call with update. Recent A1c improved.   Lab Results  Component Value Date   HGBA1C 6.5 09/19/2023          Return in about 4 months (around 01/21/2024) for follow-up with fasting labs 2 days prior.   I discussed the assessment and  treatment plan with the patient. The patient was provided an opportunity to ask questions and all were answered. The patient agreed with the plan and demonstrated an understanding of the instructions.   The patient was advised to call back or seek an in-person evaluation if the symptoms worsen or if the condition fails to improve as anticipated.   Dellar Fenton, MD

## 2023-09-21 NOTE — Telephone Encounter (Signed)
 Copied from CRM [id number]. Topic: General - Other >> Sep 21, 2023  3:25 PM Anne [name] wrote: Reason for CRM: Patient would like to know if she is supposed to have labs before her 4 month follow up in [place] with Dr Geralyn Knee? If so could orders be placed and patient be contacted to schedule please.

## 2023-09-23 ENCOUNTER — Encounter: Payer: Self-pay | Admitting: Internal Medicine

## 2023-09-23 NOTE — Assessment & Plan Note (Signed)
Colonoscopy 03/19/20 - one polyp (hepatic flexure), one 3mm polyp (sigmoid colon), diverticulosis  - pathology (polypoid colonic mucosa with benign lymphoid aggregate and polypoid colonic mucosa with mild edema of the lamina propria.  Recommended f/u colonoscopy in 5 years.  

## 2023-09-23 NOTE — Assessment & Plan Note (Signed)
 Status post colposcopy (Dr. Denman Fischer) 2015.  Pap 11/2016-negative with negative HPV.  Follow-up Pap November 2020 negative with negative HPV. Repeat 05/25/21 - negative with negative HPV.  Repeat 06/15/23 - negative with negative HPV.

## 2023-09-23 NOTE — Assessment & Plan Note (Signed)
 Will decrease ozempic  dose as outlined. Follow symptoms. Call with update.

## 2023-09-23 NOTE — Assessment & Plan Note (Signed)
 Intermittent episodes of nausea and vomiting as outlined. Adjust ozempic  dose as outlined. Follow symptoms. Further w/up pending response.

## 2023-09-23 NOTE — Assessment & Plan Note (Signed)
 Currently on metformin  and 1mg  ozempic . Discussed current intermittent GI issues/symptoms as outlined. Appears to have started after increasing ozempic  dose. Will decrease ozempic  to .5mg  weekly. Follow symptoms. Continue to stay hydrated. Discussed need to get adequate protein in diet. Follow.  Call with update. Recent A1c improved.   Lab Results  Component Value Date   HGBA1C 6.5 09/19/2023

## 2023-09-23 NOTE — Assessment & Plan Note (Signed)
 Continue current losartan  dose. Reviewed outside blood pressures.  Follow pressures. Follow metabolic panel.

## 2023-09-23 NOTE — Assessment & Plan Note (Signed)
 On lipitor.  Continue low carb diet and exercise.  Follow lipid panel and liver function tests.   Lab Results  Component Value Date   CHOL 160 09/19/2023   HDL 46.40 09/19/2023   LDLCALC 77 09/19/2023   LDLDIRECT 83.0 09/19/2022   TRIG 183.0 (H) 09/19/2023   CHOLHDL 3 09/19/2023

## 2023-09-23 NOTE — Assessment & Plan Note (Signed)
 Last colonoscopy 03/2020. Follow cbc and iron studies.

## 2023-09-25 NOTE — Telephone Encounter (Signed)
 Labs have been ordered. Please call to schedule fasting lab appointment 2 days prior to next appt with Dr Geralyn Knee.

## 2023-09-25 NOTE — Addendum Note (Signed)
 Addended by: Victorino Grates D on: 09/25/2023 07:42 AM   Modules accepted: Orders

## 2023-10-16 ENCOUNTER — Encounter: Payer: Self-pay | Admitting: Internal Medicine

## 2023-10-17 NOTE — Telephone Encounter (Signed)
 If she has the 1mg  pens then it would be 54 clicks to make .75mg  ozempic .  See me if questions.

## 2023-10-17 NOTE — Telephone Encounter (Signed)
Pt aware.

## 2023-12-24 ENCOUNTER — Other Ambulatory Visit: Payer: Self-pay | Admitting: Internal Medicine

## 2024-01-02 ENCOUNTER — Telehealth: Payer: Self-pay

## 2024-01-02 NOTE — Telephone Encounter (Signed)
 Ok to increase to 1mg  and 3 month supply.

## 2024-01-02 NOTE — Telephone Encounter (Signed)
 Copied from CRM [id number]. Topic: Clinical - Medication Question >> Jan 02, 2024 11:28 AM Deleta RAMAN wrote: Reason for CRM: patient is calling to increase to 1 mg dosage of ozempic  with a 3 month supply instead of once a month. Patient has leveled out with medication no sickness with current dosage. Please follow up with patient at [phone]

## 2024-01-02 NOTE — Telephone Encounter (Signed)
 Patient has been doing 0.75mg  (54 clicks). She was is tolerating well and wanting to increase to 1 mg. She does not have any sugars to report at this time.

## 2024-01-03 ENCOUNTER — Other Ambulatory Visit: Payer: Self-pay

## 2024-01-03 MED ORDER — OZEMPIC (1 MG/DOSE) 4 MG/3ML ~~LOC~~ SOPN: 1.0000 mg | PEN_INJECTOR | SUBCUTANEOUS | 1 refills | Status: DC

## 2024-01-16 ENCOUNTER — Other Ambulatory Visit (INDEPENDENT_AMBULATORY_CARE_PROVIDER_SITE_OTHER)

## 2024-01-16 DIAGNOSIS — E1165 Type 2 diabetes mellitus with hyperglycemia: Secondary | ICD-10-CM

## 2024-01-16 DIAGNOSIS — E7800 Pure hypercholesterolemia, unspecified: Secondary | ICD-10-CM

## 2024-01-16 LAB — HEPATIC FUNCTION PANEL
ALT: 21 U/L (ref 0–35)
AST: 18 U/L (ref 0–37)
Albumin: 4.3 g/dL (ref 3.5–5.2)
Alkaline Phosphatase: 65 U/L (ref 39–117)
Bilirubin, Direct: 0.1 mg/dL (ref 0.0–0.3)
Total Bilirubin: 0.4 mg/dL (ref 0.2–1.2)
Total Protein: 6.7 g/dL (ref 6.0–8.3)

## 2024-01-16 LAB — BASIC METABOLIC PANEL WITH GFR
BUN: 10 mg/dL (ref 6–23)
CO2: 28 meq/L (ref 19–32)
Calcium: 9.7 mg/dL (ref 8.4–10.5)
Chloride: 104 meq/L (ref 96–112)
Creatinine, Ser: 0.73 mg/dL (ref 0.40–1.20)
GFR: 92.18 mL/min (ref >60.00)
Glucose, Bld: 144 mg/dL — ABNORMAL HIGH (ref 70–99)
Potassium: 4.7 meq/L (ref 3.5–5.1)
Sodium: 140 meq/L (ref 135–145)

## 2024-01-16 LAB — LIPID PANEL
Cholesterol: 165 mg/dL (ref 0–200)
HDL: 49.6 mg/dL (ref >39.00)
LDL Cholesterol: 76 mg/dL (ref 0–99)
NonHDL: 115.72
Total CHOL/HDL Ratio: 3
Triglycerides: 200 mg/dL — ABNORMAL HIGH (ref 0.0–149.0)
VLDL: 40 mg/dL (ref 0.0–40.0)

## 2024-01-16 LAB — HEMOGLOBIN A1C: Hgb A1c MFr Bld: 6.8 % — ABNORMAL HIGH (ref 4.6–6.5)

## 2024-01-17 ENCOUNTER — Ambulatory Visit: Payer: Self-pay | Admitting: Internal Medicine

## 2024-01-23 ENCOUNTER — Encounter: Payer: Self-pay | Admitting: Internal Medicine

## 2024-01-23 ENCOUNTER — Telehealth (INDEPENDENT_AMBULATORY_CARE_PROVIDER_SITE_OTHER): Admitting: Internal Medicine

## 2024-01-23 VITALS — BP 113/72 | HR 72 | Ht 63.0 in | Wt 151.0 lb

## 2024-01-23 DIAGNOSIS — Z862 Personal history of diseases of the blood and blood-forming organs and certain disorders involving the immune mechanism: Secondary | ICD-10-CM

## 2024-01-23 DIAGNOSIS — I10 Essential (primary) hypertension: Secondary | ICD-10-CM

## 2024-01-23 DIAGNOSIS — E1165 Type 2 diabetes mellitus with hyperglycemia: Secondary | ICD-10-CM | POA: Diagnosis not present

## 2024-01-23 DIAGNOSIS — E7800 Pure hypercholesterolemia, unspecified: Secondary | ICD-10-CM | POA: Diagnosis not present

## 2024-01-23 DIAGNOSIS — Z7984 Long term (current) use of oral hypoglycemic drugs: Secondary | ICD-10-CM

## 2024-01-23 DIAGNOSIS — Z83719 Family history of colon polyps, unspecified: Secondary | ICD-10-CM

## 2024-01-23 NOTE — Progress Notes (Signed)
 Patient ID: Anne [name], female   DOB: 08-Apr-1966, 58 y.o.   MRN: [id number]   Virtual Visit via video Note  I connected with Anne [name] today by a video enabled telemedicine application and verified that I am speaking with the correct person using two identifiers. Location patient: home Location provider: work  Persons participating in the virtual visit: patient, provider  The limitations, risks, security and privacy concerns of performing an evaluation and management service by video and the availability of in person appointments has been discussed. It has also been discussed with the patient that there may be a patient responsible charge related to this service. The patient expressed understanding and agreed to proceed.   Reason for visit: follow up appt.   HPI: Follow up regarding hypercholesterolemia, diabetes and hypertension. Continues on ozempic  and metformin . Last visit, had reported some nausea and vomiting. Ozempic  dose - decreased to .5mg . Recently increased back up to 1mg  (from .75mg ). Recent A1c 6.8. she is having some GI issues with the 1mg  dose. Second week - 3x emesis. Some nausea. Takes her shot on Sunday. Staying active. No chest pain or sob reported. Handling stress.    ROS: See pertinent positives and negatives per HPI.  Past Medical History:  Diagnosis Date   Anemia    Diabetes mellitus (HCC)    Gastritis    History of abnormal Pap smear    s/p LEEP, h/o HPV   Hypercholesterolemia    Hypertension    IBS (irritable bowel syndrome)     Past Surgical History:  Procedure Laterality Date   APPENDECTOMY     BREAST BIOPSY Left 08/25/2017   left breast biopsy neg   COLONOSCOPY WITH PROPOFOL  N/A 11/28/2014   Procedure: COLONOSCOPY WITH PROPOFOL ;  Surgeon: Gladis RAYMOND Mariner, MD;  Location: [facility] ENDOSCOPY;  Service: Endoscopy;  Laterality: N/A;    Family History  Problem Relation Age of Onset   Diabetes Paternal Grandmother    Emphysema Paternal  Grandfather    Breast cancer Sister 65   Colon cancer Neg Hx     SOCIAL HX: reviewed.    Current Outpatient Medications:    atorvastatin  (LIPITOR) 20 MG tablet, Take 1 tablet (20 mg total) by mouth daily., Disp: 90 tablet, Rfl: 1   clobetasol  ointment (TEMOVATE ) 0.05 %, Spot treat affected areas bumps twice daily until improved. Avoid applying to face, groin, and axilla. Use as directed. Long-term use can cause thinning of the skin., Disp: 30 g, Rfl: 0   cyclobenzaprine  (FLEXERIL ) 5 MG tablet, Take 1-2 tablets (5-10 mg total) by mouth at bedtime as needed for muscle spasms., Disp: 30 tablet, Rfl: 1   fluticasone  (FLONASE ) 50 MCG/ACT nasal spray, Place 2 sprays into both nostrils daily., Disp: 16 g, Rfl: 6   Lancet Devices (MICROLET NEXT LANCING DEVICE) MISC, 1 each by Does not apply route daily., Disp: 1 each, Rfl: 0   losartan  (COZAAR ) 100 MG tablet, Take 1 tablet (100 mg total) by mouth daily., Disp: 90 tablet, Rfl: 3   metFORMIN  (GLUCOPHAGE -XR) 500 MG 24 hr tablet, Take 1 tablet (500 mg total) by mouth 2 (two) times daily with a meal., Disp: 180 tablet, Rfl: 1   MICROLET LANCETS MISC, 1 each by Does not apply route 3 (three) times daily., Disp: 100 each, Rfl: 11   Multiple Vitamin (MULTIVITAMIN) tablet, Take 1 tablet by mouth daily., Disp: , Rfl:    ONETOUCH ULTRA test strip, USE AS INSTRUCTED TO TEST BLOOD SUGAR THREE TIMES DAILY - USING  ONETOUCH ULTRA2 SYSTEM, Disp: 100 strip, Rfl: 8   pantoprazole  (PROTONIX ) 40 MG tablet, Take 1 tablet (40 mg total) by mouth daily., Disp: 90 tablet, Rfl: 2   Polysaccharide Iron Complex (FERREX 150 PO), Take 1 capsule by mouth daily., Disp: , Rfl:    Semaglutide , 1 MG/DOSE, (OZEMPIC , 1 MG/DOSE,) 4 MG/3ML SOPN, Inject 1 mg into the skin once a week. INJECT 1 MG ONCE A WEEK, Disp: 9 mL, Rfl: 1   venlafaxine  XR (EFFEXOR -XR) 75 MG 24 hr capsule, Take 1 capsule (75 mg total) by mouth daily with breakfast., Disp: 90 capsule, Rfl: 1  EXAM:  VITALS per patient  if applicable: weight 151lbs.   GENERAL: alert, oriented, appears well and in no acute distress  HEENT: atraumatic, conjunttiva clear, no obvious abnormalities on inspection of external nose and ears  NECK: normal movements of the head and neck  LUNGS: on inspection no signs of respiratory distress, breathing rate appears normal, no obvious gross SOB, gasping or wheezing  CV: no obvious cyanosis  PSYCH/NEURO: pleasant and cooperative, no obvious depression or anxiety, speech and thought processing grossly intact  ASSESSMENT AND PLAN:  Discussed the following assessment and plan:  Problem List Items Addressed This Visit     Type 2 diabetes mellitus with hyperglycemia (HCC)   Currently on metformin  and 1mg  ozempic . Discussed GI issues/symptoms as outlined. Appears to have started after increasing ozempic  dose back up to 1mg . Was tolerating lower dose. Continue to stay hydrated. Discussed need to get adequate protein in diet. Follow.  Call with update. Adjust medication as needed.  Lab Results  Component Value Date   HGBA1C 6.8 (H) 01/16/2024         Relevant Medications   atorvastatin  (LIPITOR) 20 MG tablet   metFORMIN  (GLUCOPHAGE -XR) 500 MG 24 hr tablet   Other Relevant Orders   Basic metabolic panel   Hemoglobin A1c   Hypertension   Continue losartan . Follow pressures. Follow metabolic panel.       Relevant Medications   atorvastatin  (LIPITOR) 20 MG tablet   Hypercholesterolemia   On lipitor.  Continue low carb diet and exercise.  Follow lipid panel and liver function tests.  Lab Results  Component Value Date   CHOL 165 01/16/2024   HDL 49.60 01/16/2024   LDLCALC 76 01/16/2024   LDLDIRECT 83.0 09/19/2022   TRIG 200.0 (H) 01/16/2024   CHOLHDL 3 01/16/2024         Relevant Medications   atorvastatin  (LIPITOR) 20 MG tablet   Other Relevant Orders   Hepatic function panel   Lipid panel   History of anemia   Last colonoscopy 03/2020. Follow cbc and iron studies.        Family history of colonic polyps - Primary    Colonoscopy 03/19/20 - one polyp (hepatic flexure), one 3mm polyp (sigmoid colon), diverticulosis  - pathology (polypoid colonic mucosa with benign lymphoid aggregate and polypoid colonic mucosa with mild edema of the lamina propria.  Recommended f/u colonoscopy in 5 years.        Return in about 3 months (around 04/24/2024) for follow-up with fasting labs 2 days prior. fasting labs after 04/21/24.   I discussed the assessment and treatment plan with the patient. The patient was provided an opportunity to ask questions and all were answered. The patient agreed with the plan and demonstrated an understanding of the instructions.   The patient was advised to call back or seek an in-person evaluation if the symptoms worsen or if the  condition fails to improve as anticipated.    Allena Hamilton, MD

## 2024-01-28 ENCOUNTER — Encounter: Payer: Self-pay | Admitting: Internal Medicine

## 2024-01-28 ENCOUNTER — Other Ambulatory Visit: Payer: Self-pay | Admitting: Internal Medicine

## 2024-01-28 ENCOUNTER — Telehealth: Payer: Self-pay | Admitting: Internal Medicine

## 2024-01-28 MED ORDER — METFORMIN HCL ER 500 MG PO TB24: 500.0000 mg | ORAL_TABLET | Freq: Two times a day (BID) | ORAL | 1 refills | Status: DC

## 2024-01-28 MED ORDER — ATORVASTATIN CALCIUM 20 MG PO TABS: 20.0000 mg | ORAL_TABLET | Freq: Every day | ORAL | 1 refills | Status: DC

## 2024-01-28 MED ORDER — VENLAFAXINE HCL ER 75 MG PO CP24: 75.0000 mg | ORAL_CAPSULE | Freq: Every day | ORAL | 1 refills | Status: DC

## 2024-01-28 MED ORDER — PANTOPRAZOLE SODIUM 40 MG PO TBEC: 40.0000 mg | DELAYED_RELEASE_TABLET | Freq: Every day | ORAL | 2 refills | Status: AC

## 2024-01-28 NOTE — Assessment & Plan Note (Signed)
 Last colonoscopy 03/2020. Follow cbc and iron studies.

## 2024-01-28 NOTE — Assessment & Plan Note (Signed)
Colonoscopy 03/19/20 - one polyp (hepatic flexure), one 3mm polyp (sigmoid colon), diverticulosis  - pathology (polypoid colonic mucosa with benign lymphoid aggregate and polypoid colonic mucosa with mild edema of the lamina propria.  Recommended f/u colonoscopy in 5 years.  

## 2024-01-28 NOTE — Assessment & Plan Note (Signed)
 On lipitor.  Continue low carb diet and exercise.  Follow lipid panel and liver function tests.  Lab Results  Component Value Date   CHOL 165 01/16/2024   HDL 49.60 01/16/2024   LDLCALC 76 01/16/2024   LDLDIRECT 83.0 09/19/2022   TRIG 200.0 (H) 01/16/2024   CHOLHDL 3 01/16/2024

## 2024-01-28 NOTE — Assessment & Plan Note (Signed)
Continue losartan.  Follow pressures.  Follow metabolic panel.  

## 2024-01-28 NOTE — Telephone Encounter (Signed)
 Needs a 3 month f/u scheduled and schedule fasting labs 1-2 days before 3 month f/u and after 04/21/24 . Thanks.

## 2024-01-28 NOTE — Assessment & Plan Note (Signed)
 Currently on metformin  and 1mg  ozempic . Discussed GI issues/symptoms as outlined. Appears to have started after increasing ozempic  dose back up to 1mg . Was tolerating lower dose. Continue to stay hydrated. Discussed need to get adequate protein in diet. Follow.  Call with update. Adjust medication as needed.  Lab Results  Component Value Date   HGBA1C 6.8 (H) 01/16/2024

## 2024-01-30 NOTE — Telephone Encounter (Signed)
 Medications were filled on 01/28/24 so please refuse.

## 2024-02-14 ENCOUNTER — Encounter: Payer: Self-pay | Admitting: Internal Medicine

## 2024-02-14 NOTE — Telephone Encounter (Signed)
 fyi

## 2024-03-28 ENCOUNTER — Encounter: Payer: Self-pay | Admitting: Internal Medicine

## 2024-03-28 LAB — OPHTHALMOLOGY REPORT-SCANNED

## 2024-04-23 ENCOUNTER — Ambulatory Visit
Admission: RE | Admit: 2024-04-23 | Discharge: 2024-04-23 | Disposition: A | Discharge status: Home / Self Care | Source: Ambulatory Visit | Attending: Internal Medicine | Admitting: Internal Medicine

## 2024-04-23 DIAGNOSIS — Z1231 Encounter for screening mammogram for malignant neoplasm of breast: Secondary | ICD-10-CM | POA: Diagnosis present

## 2024-04-27 ENCOUNTER — Other Ambulatory Visit: Payer: Self-pay | Admitting: Internal Medicine

## 2024-05-01 ENCOUNTER — Ambulatory Visit: Payer: Self-pay | Admitting: Internal Medicine

## 2024-05-01 ENCOUNTER — Other Ambulatory Visit

## 2024-05-01 DIAGNOSIS — E7800 Pure hypercholesterolemia, unspecified: Secondary | ICD-10-CM

## 2024-05-01 DIAGNOSIS — E1165 Type 2 diabetes mellitus with hyperglycemia: Secondary | ICD-10-CM | POA: Diagnosis not present

## 2024-05-01 LAB — BASIC METABOLIC PANEL WITH GFR
BUN: 14 mg/dL (ref 6–23)
CO2: 29 meq/L (ref 19–32)
Calcium: 9.4 mg/dL (ref 8.4–10.5)
Chloride: 104 meq/L (ref 96–112)
Creatinine, Ser: 0.79 mg/dL (ref 0.40–1.20)
GFR: 83.67 mL/min (ref >60.00)
Glucose, Bld: 148 mg/dL — ABNORMAL HIGH (ref 70–99)
Potassium: 4.3 meq/L (ref 3.5–5.1)
Sodium: 140 meq/L (ref 135–145)

## 2024-05-01 LAB — LIPID PANEL
Cholesterol: 142 mg/dL (ref 0–200)
HDL: 51.7 mg/dL (ref >39.00)
LDL Cholesterol: 63 mg/dL (ref 0–99)
NonHDL: 90.34
Total CHOL/HDL Ratio: 3
Triglycerides: 137 mg/dL (ref 0.0–149.0)
VLDL: 27.4 mg/dL (ref 0.0–40.0)

## 2024-05-01 LAB — HEPATIC FUNCTION PANEL
ALT: 18 U/L (ref 0–35)
AST: 16 U/L (ref 0–37)
Albumin: 4.3 g/dL (ref 3.5–5.2)
Alkaline Phosphatase: 62 U/L (ref 39–117)
Bilirubin, Direct: 0.1 mg/dL (ref 0.0–0.3)
Total Bilirubin: 0.4 mg/dL (ref 0.2–1.2)
Total Protein: 6.8 g/dL (ref 6.0–8.3)

## 2024-05-01 LAB — HEMOGLOBIN A1C: Hgb A1c MFr Bld: 6.1 % (ref 4.6–6.5)

## 2024-05-03 ENCOUNTER — Encounter: Payer: Self-pay | Admitting: Internal Medicine

## 2024-05-03 NOTE — Telephone Encounter (Signed)
 Printed. Hold for 05/06/24 appt

## 2024-05-06 ENCOUNTER — Telehealth: Admitting: Internal Medicine

## 2024-05-06 ENCOUNTER — Encounter: Payer: Self-pay | Admitting: Internal Medicine

## 2024-05-06 VITALS — BP 122/78 | HR 86 | Ht 63.0 in | Wt 140.0 lb

## 2024-05-06 DIAGNOSIS — I10 Essential (primary) hypertension: Secondary | ICD-10-CM | POA: Diagnosis not present

## 2024-05-06 DIAGNOSIS — Z7985 Long-term (current) use of injectable non-insulin antidiabetic drugs: Secondary | ICD-10-CM

## 2024-05-06 DIAGNOSIS — E7800 Pure hypercholesterolemia, unspecified: Secondary | ICD-10-CM | POA: Diagnosis not present

## 2024-05-06 DIAGNOSIS — E1165 Type 2 diabetes mellitus with hyperglycemia: Secondary | ICD-10-CM | POA: Diagnosis not present

## 2024-05-06 NOTE — Progress Notes (Signed)
 Patient ID: Anne [name], female   DOB: May 25, 1966, 58 y.o.   MRN: [id number]   Virtual Visit via video Note  I connected with Anne [name] by a video enabled telemedicine application and verified that I am speaking with the correct person using two identifiers. Location patient: home Location provider: work  Persons participating in the virtual visit: patient, provider  Tthe limitations, risks, security and privacy concerns of performing an evaluation and management service by video and the availability of in person appointments have been discussed. It has also been discussed with the patient that there may be a patient responsible charge related to this service. The patient expressed understanding and agreed to proceed.   Reason for visit: scheduled follow up.   HPI: Follow up regarding diabetes, hypertension and hypercholesterolemia. Doing well. Feels good. Has lost weight. Tolerating GLP 1 agonist. No chest pain or sob. No abdominal pain or bowel change reported. Discussed metformin  - taking q am. Saw Dr Harden [place] - 04/2024 - eye exam ok. Reports maternal great grandfather - cirrhosis. Discussed.    ROS: See pertinent positives and negatives per HPI.  Past Medical History:  Diagnosis Date   Anemia    Diabetes mellitus (HCC)    Gastritis    GERD (gastroesophageal reflux disease)    History of abnormal Pap smear    s/p LEEP, h/o HPV   Hypercholesterolemia    Hypertension    IBS (irritable bowel syndrome)    Sleep apnea Oct 2016   Still getting used to cpap machine    Past Surgical History:  Procedure Laterality Date   APPENDECTOMY     BREAST BIOPSY Left 08/25/2017   left breast biopsy neg   COLONOSCOPY WITH PROPOFOL  N/A 11/28/2014   Procedure: COLONOSCOPY WITH PROPOFOL ;  Surgeon: Anne RAYMOND Mariner, MD;  Location: [facility] ENDOSCOPY;  Service: Endoscopy;  Laterality: N/A;   EYE SURGERY  2004   lasik   HERNIA REPAIR      Family History  Problem Relation Age  of Onset   Diabetes Paternal Grandmother    Emphysema Paternal Grandfather    Breast cancer Sister 60   Diabetes Maternal Grandmother    Cancer Sister    Colon cancer Neg Hx     SOCIAL HX: reviewed.    Current Outpatient Medications:    clobetasol  ointment (TEMOVATE ) 0.05 %, Spot treat affected areas bumps twice daily until improved. Avoid applying to face, groin, and axilla. Use as directed. Long-term use can cause thinning of the skin., Disp: 30 g, Rfl: 0   cyclobenzaprine  (FLEXERIL ) 5 MG tablet, Take 1-2 tablets (5-10 mg total) by mouth at bedtime as needed for muscle spasms., Disp: 30 tablet, Rfl: 1   fluticasone  (FLONASE ) 50 MCG/ACT nasal spray, Place 2 sprays into both nostrils daily., Disp: 16 g, Rfl: 6   Lancet Devices (MICROLET NEXT LANCING DEVICE) MISC, 1 each by Does not apply route daily., Disp: 1 each, Rfl: 0   losartan  (COZAAR ) 100 MG tablet, TAKE 1 TABLET BY MOUTH EVERY DAY, Disp: 90 tablet, Rfl: 3   MICROLET LANCETS MISC, 1 each by Does not apply route 3 (three) times daily., Disp: 100 each, Rfl: 11   Multiple Vitamin (MULTIVITAMIN) tablet, Take 1 tablet by mouth daily., Disp: , Rfl:    ONETOUCH ULTRA test strip, USE AS INSTRUCTED TO TEST BLOOD SUGAR THREE TIMES DAILY - USING ONETOUCH ULTRA2 SYSTEM, Disp: 100 strip, Rfl: 8   pantoprazole  (PROTONIX ) 40 MG tablet, Take 1 tablet (40 mg total) by  mouth daily., Disp: 90 tablet, Rfl: 2   Polysaccharide Iron Complex (FERREX 150 PO), Take 1 capsule by mouth daily., Disp: , Rfl:    atorvastatin  (LIPITOR) 20 MG tablet, Take 1 tablet (20 mg total) by mouth daily., Disp: 90 tablet, Rfl: 1   metFORMIN  (GLUCOPHAGE -XR) 500 MG 24 hr tablet, Take 1 tablet (500 mg total) by mouth daily with breakfast., Disp: 90 tablet, Rfl: 1   Semaglutide , 1 MG/DOSE, (OZEMPIC , 1 MG/DOSE,) 4 MG/3ML SOPN, Inject 1 mg into the skin once a week. INJECT 1 MG ONCE A WEEK, Disp: 9 mL, Rfl: 1   venlafaxine  XR (EFFEXOR -XR) 75 MG 24 hr capsule, Take 1 capsule (75 mg  total) by mouth daily with breakfast., Disp: 90 capsule, Rfl: 1  EXAM:  GENERAL: alert, oriented, appears well and in no acute distress  HEENT: atraumatic, conjunttiva clear, no obvious abnormalities on inspection of external nose and ears  NECK: normal movements of the head and neck  LUNGS: on inspection no signs of respiratory distress, breathing rate appears normal, no obvious gross SOB, gasping or wheezing  CV: no obvious cyanosis  PSYCH/NEURO: pleasant and cooperative, no obvious depression or anxiety, speech and thought processing grossly intact  ASSESSMENT AND PLAN:  Discussed the following assessment and plan:  Problem List Items Addressed This Visit     Type 2 diabetes mellitus with hyperglycemia (HCC)   Currently on metformin  and 1mg  ozempic . Tolerating ozempic . Has lost weight. Discussed recent labs. Discussed metformin  - taking qam. Follow.  Lab Results  Component Value Date   HGBA1C 6.1 05/01/2024         Relevant Medications   atorvastatin  (LIPITOR) 20 MG tablet   metFORMIN  (GLUCOPHAGE -XR) 500 MG 24 hr tablet   Semaglutide , 1 MG/DOSE, (OZEMPIC , 1 MG/DOSE,) 4 MG/3ML SOPN   Other Relevant Orders   Basic metabolic panel   Hemoglobin A1c   Urine Albumin/Creatinine with ratio (send out) [LAB689]   Hypertension - Primary   Continue losartan . Follow met b.       Relevant Medications   atorvastatin  (LIPITOR) 20 MG tablet   Hypercholesterolemia   On lipitor.  Continue low carb diet and exercise.  Follow lipid panel and liver function tests. Discussed recent lab results.  Lab Results  Component Value Date   CHOL 142 05/01/2024   HDL 51.70 05/01/2024   LDLCALC 63 05/01/2024   LDLDIRECT 83.0 09/19/2022   TRIG 137.0 05/01/2024   CHOLHDL 3 05/01/2024         Relevant Medications   atorvastatin  (LIPITOR) 20 MG tablet   Other Relevant Orders   Lipid panel   Hepatic function panel    Return in about 4 months (around 09/04/2024) for physical with fasting labs  2 days prior.   I discussed the assessment and treatment plan with the patient. The patient was provided an opportunity to ask questions and all were answered. The patient agreed with the plan and demonstrated an understanding of the instructions.   The patient was advised to call back or seek an in-person evaluation if the symptoms worsen or if the condition fails to improve as anticipated.    Allena Hamilton, MD

## 2024-05-12 ENCOUNTER — Encounter: Payer: Self-pay | Admitting: Internal Medicine

## 2024-05-12 MED ORDER — ATORVASTATIN CALCIUM 20 MG PO TABS: 20.0000 mg | ORAL_TABLET | Freq: Every day | ORAL | 1 refills | Status: AC

## 2024-05-12 MED ORDER — VENLAFAXINE HCL ER 75 MG PO CP24: 75.0000 mg | ORAL_CAPSULE | Freq: Every day | ORAL | 1 refills | Status: AC

## 2024-05-12 MED ORDER — OZEMPIC (1 MG/DOSE) 4 MG/3ML ~~LOC~~ SOPN: 1.0000 mg | PEN_INJECTOR | SUBCUTANEOUS | 1 refills | Status: AC

## 2024-05-12 MED ORDER — METFORMIN HCL ER 500 MG PO TB24: 500.0000 mg | ORAL_TABLET | Freq: Every day | ORAL | 1 refills | Status: AC

## 2024-05-12 NOTE — Assessment & Plan Note (Signed)
 On lipitor.  Continue low carb diet and exercise.  Follow lipid panel and liver function tests. Discussed recent lab results.  Lab Results  Component Value Date   CHOL 142 05/01/2024   HDL 51.70 05/01/2024   LDLCALC 63 05/01/2024   LDLDIRECT 83.0 09/19/2022   TRIG 137.0 05/01/2024   CHOLHDL 3 05/01/2024

## 2024-05-12 NOTE — Assessment & Plan Note (Signed)
 Continue losartan . Follow met b.

## 2024-05-12 NOTE — Assessment & Plan Note (Signed)
 Currently on metformin  and 1mg  ozempic . Tolerating ozempic . Has lost weight. Discussed recent labs. Discussed metformin  - taking qam. Follow.  Lab Results  Component Value Date   HGBA1C 6.1 05/01/2024

## 2024-05-20 ENCOUNTER — Telehealth: Admitting: Family Medicine

## 2024-05-20 DIAGNOSIS — B9689 Other specified bacterial agents as the cause of diseases classified elsewhere: Secondary | ICD-10-CM

## 2024-05-20 MED ORDER — BENZONATATE 100 MG PO CAPS: 100.0000 mg | ORAL_CAPSULE | Freq: Three times a day (TID) | ORAL | 0 refills | Status: AC | PRN

## 2024-05-20 MED ORDER — AZITHROMYCIN 250 MG PO TABS: ORAL_TABLET | ORAL | 0 refills | Status: AC

## 2024-05-20 NOTE — Patient Instructions (Addendum)
 Anne [name], thank you for joining [name], NP for today's virtual visit.  While this provider is not your primary care provider (PCP), if your PCP is located in our provider database this encounter information will be shared with them immediately following your visit.   [name] gives you access to today's visit and all your visits, tests, and labs performed at [facility]  click here if you don't have a [facility] MyChart account or go to [email or web address]  Consent: (Patient) Anne [name] provided verbal consent for this virtual visit at the beginning of the encounter.  Current Medications:  Current Outpatient Medications:    azithromycin  (ZITHROMAX ) 250 MG tablet, Take 2 tablets on day 1, then 1 tablet daily on days 2 through 5, Disp: 6 tablet, Rfl: 0   benzonatate  (TESSALON ) 100 MG capsule, Take 1-2 capsules (100-200 mg total) by mouth 3 (three) times daily as needed for cough., Disp: 30 capsule, Rfl: 0   atorvastatin  (LIPITOR) 20 MG tablet, Take 1 tablet (20 mg total) by mouth daily., Disp: 90 tablet, Rfl: 1   clobetasol  ointment (TEMOVATE ) 0.05 %, Spot treat affected areas bumps twice daily until improved. Avoid applying to face, groin, and axilla. Use as directed. Long-term use can cause thinning of the skin., Disp: 30 g, Rfl: 0   cyclobenzaprine  (FLEXERIL ) 5 MG tablet, Take 1-2 tablets (5-10 mg total) by mouth at bedtime as needed for muscle spasms., Disp: 30 tablet, Rfl: 1   fluticasone  (FLONASE ) 50 MCG/ACT nasal spray, Place 2 sprays into both nostrils daily., Disp: 16 g, Rfl: 6   Lancet Devices (MICROLET NEXT LANCING DEVICE) MISC, 1 each by Does not apply route daily., Disp: 1 each, Rfl: 0   losartan  (COZAAR ) 100 MG tablet, TAKE 1 TABLET BY MOUTH EVERY DAY, Disp: 90 tablet, Rfl: 3   metFORMIN  (GLUCOPHAGE -XR) 500 MG 24 hr tablet, Take 1 tablet (500 mg total) by mouth daily with breakfast., Disp: 90 tablet, Rfl: 1    MICROLET LANCETS MISC, 1 each by Does not apply route 3 (three) times daily., Disp: 100 each, Rfl: 11   Multiple Vitamin (MULTIVITAMIN) tablet, Take 1 tablet by mouth daily., Disp: , Rfl:    ONETOUCH ULTRA test strip, USE AS INSTRUCTED TO TEST BLOOD SUGAR THREE TIMES DAILY - USING ONETOUCH ULTRA2 SYSTEM, Disp: 100 strip, Rfl: 8   pantoprazole  (PROTONIX ) 40 MG tablet, Take 1 tablet (40 mg total) by mouth daily., Disp: 90 tablet, Rfl: 2   Polysaccharide Iron Complex (FERREX 150 PO), Take 1 capsule by mouth daily., Disp: , Rfl:    Semaglutide , 1 MG/DOSE, (OZEMPIC , 1 MG/DOSE,) 4 MG/3ML SOPN, Inject 1 mg into the skin once a week. INJECT 1 MG ONCE A WEEK, Disp: 9 mL, Rfl: 1   venlafaxine  XR (EFFEXOR -XR) 75 MG 24 hr capsule, Take 1 capsule (75 mg total) by mouth daily with breakfast., Disp: 90 capsule, Rfl: 1   Medications ordered in this encounter:  Meds ordered this encounter  Medications   azithromycin  (ZITHROMAX ) 250 MG tablet    Sig: Take 2 tablets on day 1, then 1 tablet daily on days 2 through 5    Dispense:  6 tablet    Refill:  0    Supervising Provider:   LAMPTEY, PHILIP O [[id number]]   benzonatate  (TESSALON ) 100 MG capsule    Sig: Take 1-2 capsules (100-200 mg total) by mouth 3 (three) times daily as needed for cough.    Dispense:  30 capsule  Refill:  0    Supervising Provider:   BLAISE ALEENE KIDD [[id number]]     *If you need refills on other medications prior to your next appointment, please contact your pharmacy*  Follow-Up: Call back or seek an in-person evaluation if the symptoms worsen or if the condition fails to improve as anticipated.  [facility] [phone]  Other Instructions  - Increased rest - Increasing Fluids - Acetaminophen / ibuprofen as needed for fever/pain.  - Saline nasal spray if congestion or if nasal passages feel dry. - Humidifying the air.    If you have been instructed to have an in-person evaluation today at a local Urgent Care  facility, please use the link below. It will take you to a list of all of our available [facility], including address, phone number and hours of operation. Please do not delay care.  [facility]  If you or a family member do not have a primary care provider, use the link below to schedule a visit and establish care. When you choose a [facility] primary care physician or advanced practice provider, you gain a long-term partner in health. Find a Primary Care Provider  Learn more about [facility]'s in-office and virtual care options: [facility] - Get Care Now

## 2024-05-20 NOTE — Progress Notes (Signed)
 Virtual Visit Consent   Anne [name], you are scheduled for a virtual visit with a [facility] provider today. Just as with appointments in the office, your consent must be obtained to participate. Your consent will be active for this visit and any virtual visit you may have with one of our providers in the next 365 days. If you have a MyChart account, a copy of this consent can be sent to you electronically.  As this is a virtual visit, video technology does not allow for your provider to perform a traditional examination. This may limit your provider's ability to fully assess your condition. If your provider identifies any concerns that need to be evaluated in person or the need to arrange testing (such as labs, EKG, etc.), we will make arrangements to do so. Although advances in technology are sophisticated, we cannot ensure that it will always work on either your end or our end. If the connection with a video visit is poor, the visit may have to be switched to a telephone visit. With either a video or telephone visit, we are not always able to ensure that we have a secure connection.  By engaging in this virtual visit, you consent to the provision of healthcare and authorize for your insurance to be billed (if applicable) for the services provided during this visit. Depending on your insurance coverage, you may receive a charge related to this service.  I need to obtain your verbal consent now. Are you willing to proceed with your visit today? Anne [name] has provided verbal consent on 05/20/2024 for a virtual visit (video or telephone). Anne CHRISTELLA Barefoot, NP  Date: 05/20/2024 11:30 AM   Virtual Visit via Video Note   I, Anne [name], connected with  Anne [name]  ([id number], 10-12-1965) on 05/20/2024 at 11:30 AM EST by a video-enabled telemedicine application and verified that I am speaking with the correct person using two identifiers.  Location: Patient: Virtual  Visit Location Patient: Home Provider: Virtual Visit Location Provider: Home Office   I discussed the limitations of evaluation and management by telemedicine and the availability of in person appointments. The patient expressed understanding and agreed to proceed.    History of Present Illness: Anne [name] is a 58 y.o. who identifies as a female who was assigned female at birth, and is being seen today for sinus congestion  Onset was 5 days ago with sinus congestion, headache, chest congestion with coughing,  sore throat Associated symptoms are body aches, chest congestion heavier in the morning, sleep trouble, congestion is now green.  Modifying factors are tylenol,and some robitussin (helps some) Denies chest pain, shortness of breath, fevers, chills  Exposure to sick contacts- around young kids  COVID test: no Vaccines: none  Problems:  Patient Active Problem List   Diagnosis Date Noted   Sleep difficulties 06/12/2022   Bronchitis 10/29/2021   Hot flashes 02/29/2020   Type 2 diabetes mellitus with hyperglycemia (HCC) 10/28/2019   Menstrual abnormality 10/27/2018   Mass of lower outer quadrant of left breast 08/21/2017   Obstructive sleep apnea 02/01/2015   Family history of colonic polyps 09/08/2014   Health care maintenance 09/08/2014   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  Resting comfortably  at home.  Head is normocephalic, atraumatic.  No labored breathing.  Speech is clear and coherent with logical content.  Patient is alert and oriented at baseline.  Cough present Congestion tone noted   Assessment and Plan:  1. Acute bacterial sinusitis (Primary)  -  azithromycin  (ZITHROMAX ) 250 MG tablet; Take 2 tablets on day 1, then 1 tablet daily on days 2 through 5  Dispense: 6 tablet; Refill: 0 - benzonatate  (TESSALON ) 100 MG capsule; Take 1-2 capsules (100-200 mg total) by mouth 3 (three) times daily as needed for cough.  Dispense: 30 capsule; Refill: 0   - Increased rest - Increasing Fluids - Acetaminophen / ibuprofen as needed for fever/pain.  - Saline nasal spray if congestion or if nasal passages feel dry. - Humidifying the air.     Reviewed side effects, risks and benefits of medication.    Patient acknowledged agreement and understanding of the plan.   Past Medical, Surgical, Social History, Allergies, and Medications have been Reviewed.   Follow Up Instructions: I discussed the assessment and treatment plan with the patient. The patient was provided an opportunity to ask questions and all were answered. The patient agreed with the plan and demonstrated an understanding of the instructions.  A copy of instructions were sent to the patient via MyChart unless otherwise noted below.   The patient was advised to call back or seek an in-person evaluation if the symptoms worsen or if the condition fails to improve as anticipated.    Anne CHRISTELLA Barefoot, NP     [1]  Allergies Allergen Reactions   Trulicity  [Dulaglutide ] Nausea And Vomiting   Jardiance  [Empagliflozin ] Itching   Januvia  [Sitagliptin ] Other (See Comments)    Elbow/Knee/Jaw joint pain, irritability and bloating  [2]  Current Outpatient Medications:    atorvastatin  (LIPITOR) 20 MG tablet, Take 1 tablet (20 mg total) by mouth daily., Disp: 90 tablet, Rfl: 1   clobetasol  ointment (TEMOVATE ) 0.05 %, Spot treat affected areas bumps twice daily until improved. Avoid applying to face, groin, and axilla. Use as directed. Long-term use can cause thinning of the skin., Disp: 30 g, Rfl: 0   cyclobenzaprine  (FLEXERIL ) 5 MG tablet, Take 1-2 tablets (5-10 mg total) by mouth at bedtime as  needed for muscle spasms., Disp: 30 tablet, Rfl: 1   fluticasone  (FLONASE ) 50 MCG/ACT nasal spray, Place 2 sprays into both nostrils daily., Disp: 16 g, Rfl: 6   Lancet Devices (MICROLET NEXT LANCING DEVICE) MISC, 1 each by Does not apply route daily., Disp: 1 each, Rfl: 0   losartan  (COZAAR ) 100 MG tablet, TAKE 1 TABLET BY MOUTH EVERY DAY, Disp: 90 tablet, Rfl: 3   metFORMIN  (GLUCOPHAGE -XR) 500 MG 24 hr tablet, Take 1 tablet (500 mg total) by mouth daily with breakfast., Disp: 90 tablet, Rfl: 1   MICROLET LANCETS MISC, 1 each by Does not apply route 3 (three) times daily., Disp: 100 each, Rfl: 11   Multiple Vitamin (MULTIVITAMIN) tablet, Take 1 tablet by mouth daily., Disp: , Rfl:    ONETOUCH ULTRA test strip, USE AS INSTRUCTED TO TEST BLOOD SUGAR THREE TIMES DAILY - USING ONETOUCH ULTRA2 SYSTEM, Disp: 100 strip, Rfl: 8   pantoprazole  (PROTONIX ) 40 MG tablet, Take 1 tablet (40 mg total) by mouth daily., Disp: 90 tablet, Rfl: 2   Polysaccharide Iron Complex (FERREX 150 PO), Take 1 capsule by mouth daily., Disp: , Rfl:    Semaglutide , 1 MG/DOSE, (OZEMPIC , 1 MG/DOSE,) 4 MG/3ML SOPN, Inject 1 mg into the skin once a week. INJECT  1 MG ONCE A WEEK, Disp: 9 mL, Rfl: 1   venlafaxine  XR (EFFEXOR -XR) 75 MG 24 hr capsule, Take 1 capsule (75 mg total) by mouth daily with breakfast., Disp: 90 capsule, Rfl: 1

## 2024-05-23 ENCOUNTER — Encounter: Payer: Self-pay | Admitting: Family Medicine

## 2024-09-03 ENCOUNTER — Ambulatory Visit: Payer: 59 | Admitting: Dermatology

## 2024-09-10 ENCOUNTER — Other Ambulatory Visit

## 2024-09-12 ENCOUNTER — Encounter: Admitting: Internal Medicine
